# Patient Record
Sex: Female | Born: 1975 | Race: White
Health system: Southern US, Community
[De-identification: ages and names within clinical notes are randomized; demographics above are authoritative.]

## PROBLEM LIST (undated history)

## (undated) ENCOUNTER — Emergency Department (HOSPITAL_COMMUNITY): Payer: Self-pay | Source: Home / Self Care

## (undated) DIAGNOSIS — R87629 Unspecified abnormal cytological findings in specimens from vagina: Secondary | ICD-10-CM

## (undated) DIAGNOSIS — Z22330 Carrier of Group B streptococcus: Secondary | ICD-10-CM

## (undated) DIAGNOSIS — F32A Depression, unspecified: Secondary | ICD-10-CM

## (undated) DIAGNOSIS — J302 Other seasonal allergic rhinitis: Secondary | ICD-10-CM

## (undated) DIAGNOSIS — N151 Renal and perinephric abscess: Secondary | ICD-10-CM

## (undated) DIAGNOSIS — IMO0002 Reserved for concepts with insufficient information to code with codable children: Secondary | ICD-10-CM

## (undated) DIAGNOSIS — B999 Unspecified infectious disease: Secondary | ICD-10-CM

## (undated) DIAGNOSIS — F329 Major depressive disorder, single episode, unspecified: Secondary | ICD-10-CM

## (undated) DIAGNOSIS — B373 Candidiasis of vulva and vagina: Secondary | ICD-10-CM

## (undated) DIAGNOSIS — F909 Attention-deficit hyperactivity disorder, unspecified type: Secondary | ICD-10-CM

## (undated) DIAGNOSIS — F419 Anxiety disorder, unspecified: Secondary | ICD-10-CM

## (undated) DIAGNOSIS — B3731 Acute candidiasis of vulva and vagina: Secondary | ICD-10-CM

## (undated) HISTORY — DX: Attention-deficit hyperactivity disorder, unspecified type: F90.9

## (undated) HISTORY — PX: WISDOM TOOTH EXTRACTION: SHX21

## (undated) HISTORY — DX: Anxiety disorder, unspecified: F41.9

## (undated) HISTORY — DX: Acute candidiasis of vulva and vagina: B37.31

## (undated) HISTORY — DX: Candidiasis of vulva and vagina: B37.3

## (undated) HISTORY — PX: OTHER SURGICAL HISTORY: SHX169

## (undated) HISTORY — PX: KNEE SURGERY: SHX244

## (undated) HISTORY — PX: ANTERIOR CRUCIATE LIGAMENT REPAIR: SHX115

## (undated) HISTORY — PX: TUBAL LIGATION: SHX77

## (undated) HISTORY — PX: COLPOSCOPY: SHX161

---

## 1997-12-22 ENCOUNTER — Inpatient Hospital Stay (HOSPITAL_COMMUNITY): Admission: AD | Admit: 1997-12-22 | Discharge: 1997-12-22 | Payer: Self-pay | Admitting: *Deleted

## 1998-09-12 ENCOUNTER — Inpatient Hospital Stay (HOSPITAL_COMMUNITY): Admission: AD | Admit: 1998-09-12 | Discharge: 1998-09-12 | Payer: Self-pay | Admitting: Obstetrics

## 1998-09-14 ENCOUNTER — Inpatient Hospital Stay (HOSPITAL_COMMUNITY): Admission: AD | Admit: 1998-09-14 | Discharge: 1998-09-14 | Payer: Self-pay | Admitting: Obstetrics

## 1998-10-28 ENCOUNTER — Emergency Department (HOSPITAL_COMMUNITY): Admission: EM | Admit: 1998-10-28 | Discharge: 1998-10-28 | Payer: Self-pay | Admitting: Emergency Medicine

## 1999-05-02 ENCOUNTER — Inpatient Hospital Stay (HOSPITAL_COMMUNITY): Admission: AD | Admit: 1999-05-02 | Discharge: 1999-05-02 | Payer: Self-pay | Admitting: Obstetrics & Gynecology

## 2000-01-27 ENCOUNTER — Emergency Department (HOSPITAL_COMMUNITY): Admission: EM | Admit: 2000-01-27 | Discharge: 2000-01-27 | Payer: Self-pay | Admitting: Emergency Medicine

## 2001-04-02 ENCOUNTER — Emergency Department: Admit: 2001-04-02 | Payer: Self-pay | Source: Emergency Department | Admitting: Emergency Medicine

## 2001-05-03 ENCOUNTER — Emergency Department (HOSPITAL_COMMUNITY): Admission: EM | Admit: 2001-05-03 | Discharge: 2001-05-03 | Payer: Self-pay

## 2001-05-03 ENCOUNTER — Encounter: Payer: Self-pay | Admitting: Emergency Medicine

## 2001-06-21 ENCOUNTER — Emergency Department (HOSPITAL_COMMUNITY): Admission: EM | Admit: 2001-06-21 | Discharge: 2001-06-21 | Payer: Self-pay | Admitting: *Deleted

## 2001-08-10 ENCOUNTER — Emergency Department (HOSPITAL_COMMUNITY): Admission: EM | Admit: 2001-08-10 | Discharge: 2001-08-10 | Payer: Self-pay

## 2001-08-23 ENCOUNTER — Emergency Department: Admit: 2001-08-23 | Payer: Self-pay | Source: Emergency Department | Admitting: Internal Medicine

## 2001-12-15 ENCOUNTER — Inpatient Hospital Stay (HOSPITAL_COMMUNITY): Admission: AD | Admit: 2001-12-15 | Discharge: 2001-12-15 | Payer: Self-pay | Admitting: Obstetrics

## 2002-02-02 ENCOUNTER — Emergency Department (HOSPITAL_COMMUNITY): Admission: EM | Admit: 2002-02-02 | Discharge: 2002-02-02 | Payer: Self-pay | Admitting: Emergency Medicine

## 2002-02-02 ENCOUNTER — Encounter: Payer: Self-pay | Admitting: Emergency Medicine

## 2002-05-08 ENCOUNTER — Encounter: Payer: Self-pay | Admitting: Emergency Medicine

## 2002-05-08 ENCOUNTER — Emergency Department (HOSPITAL_COMMUNITY): Admission: EM | Admit: 2002-05-08 | Discharge: 2002-05-08 | Payer: Self-pay | Admitting: Emergency Medicine

## 2002-05-10 ENCOUNTER — Emergency Department (HOSPITAL_COMMUNITY): Admission: EM | Admit: 2002-05-10 | Discharge: 2002-05-10 | Payer: Self-pay | Admitting: Emergency Medicine

## 2002-05-10 ENCOUNTER — Encounter: Payer: Self-pay | Admitting: Emergency Medicine

## 2002-12-13 ENCOUNTER — Encounter (INDEPENDENT_AMBULATORY_CARE_PROVIDER_SITE_OTHER): Payer: Self-pay | Admitting: Specialist

## 2002-12-13 ENCOUNTER — Ambulatory Visit (HOSPITAL_COMMUNITY): Admission: RE | Admit: 2002-12-13 | Discharge: 2002-12-13 | Payer: Self-pay | Admitting: Obstetrics

## 2004-04-30 ENCOUNTER — Emergency Department (HOSPITAL_COMMUNITY): Admission: EM | Admit: 2004-04-30 | Discharge: 2004-04-30 | Payer: Self-pay | Admitting: Family Medicine

## 2004-07-22 ENCOUNTER — Emergency Department (HOSPITAL_COMMUNITY): Admission: EM | Admit: 2004-07-22 | Discharge: 2004-07-22 | Payer: Self-pay | Admitting: Emergency Medicine

## 2004-08-19 ENCOUNTER — Inpatient Hospital Stay (HOSPITAL_COMMUNITY): Admission: AD | Admit: 2004-08-19 | Discharge: 2004-08-19 | Payer: Self-pay | Admitting: Obstetrics & Gynecology

## 2005-01-07 ENCOUNTER — Ambulatory Visit
Admit: 2005-01-07 | Disposition: A | Discharge status: Home / Self Care | Payer: Self-pay | Source: Ambulatory Visit | Admitting: Obstetrics and Gynecology

## 2005-03-03 ENCOUNTER — Observation Stay
Admission: RE | Admit: 2005-03-03 | Disposition: A | Discharge status: Home / Self Care | Payer: Self-pay | Source: Ambulatory Visit | Admitting: Obstetrics & Gynecology

## 2005-03-23 ENCOUNTER — Emergency Department (HOSPITAL_COMMUNITY): Admission: EM | Admit: 2005-03-23 | Discharge: 2005-03-23 | Payer: Self-pay | Admitting: Emergency Medicine

## 2005-05-13 ENCOUNTER — Observation Stay
Admission: RE | Admit: 2005-05-13 | Disposition: A | Discharge status: Home / Self Care | Payer: Self-pay | Source: Ambulatory Visit | Admitting: Obstetrics and Gynecology

## 2005-05-15 ENCOUNTER — Inpatient Hospital Stay
Admission: RE | Admit: 2005-05-15 | Disposition: A | Discharge status: Home / Self Care | Payer: Self-pay | Source: Ambulatory Visit | Admitting: Obstetrics and Gynecology

## 2005-06-09 ENCOUNTER — Ambulatory Visit (HOSPITAL_BASED_OUTPATIENT_CLINIC_OR_DEPARTMENT_OTHER): Admission: RE | Admit: 2005-06-09 | Discharge: 2005-06-10 | Payer: Self-pay | Admitting: Orthopedic Surgery

## 2005-06-18 ENCOUNTER — Encounter: Admission: RE | Admit: 2005-06-18 | Discharge: 2005-08-08 | Payer: Self-pay | Admitting: Orthopedic Surgery

## 2005-08-21 ENCOUNTER — Emergency Department (HOSPITAL_COMMUNITY): Admission: EM | Admit: 2005-08-21 | Discharge: 2005-08-21 | Payer: Self-pay | Admitting: Emergency Medicine

## 2006-11-27 ENCOUNTER — Ambulatory Visit: Admission: RE | Admit: 2006-11-27 | Payer: Self-pay | Source: Ambulatory Visit | Admitting: Obstetrics and Gynecology

## 2006-12-18 LAB — CYTOGENETICS-SOLID TISSUE (POC)

## 2007-01-29 ENCOUNTER — Emergency Department (HOSPITAL_COMMUNITY): Admission: EM | Admit: 2007-01-29 | Discharge: 2007-01-30 | Payer: Self-pay | Admitting: Emergency Medicine

## 2007-03-21 ENCOUNTER — Emergency Department (HOSPITAL_COMMUNITY): Admission: EM | Admit: 2007-03-21 | Discharge: 2007-03-21 | Payer: Self-pay | Admitting: Emergency Medicine

## 2007-07-29 ENCOUNTER — Inpatient Hospital Stay: Admit: 2007-07-29 | Disposition: A | Discharge status: Home / Self Care | Payer: Self-pay | Source: Ambulatory Visit

## 2008-01-26 ENCOUNTER — Emergency Department (HOSPITAL_COMMUNITY): Admission: EM | Admit: 2008-01-26 | Discharge: 2008-01-26 | Payer: Self-pay | Admitting: Emergency Medicine

## 2008-02-02 ENCOUNTER — Inpatient Hospital Stay
Admission: RE | Admit: 2008-02-02 | Disposition: A | Discharge status: Home / Self Care | Payer: Self-pay | Source: Ambulatory Visit | Admitting: Obstetrics and Gynecology

## 2008-02-02 LAB — CBC
Hematocrit: 36 % — ABNORMAL LOW (ref 37.0–47.0)
Hgb: 12.5 G/DL (ref 12.0–16.0)
MCH: 29.8 PG (ref 28.0–32.0)
MCHC: 34.7 G/DL (ref 32.0–36.0)
MCV: 85.7 FL (ref 80.0–100.0)
MPV: 9.8 FL (ref 9.4–12.3)
Platelets: 312 /mm3 (ref 140–400)
RBC: 4.2 /mm3 (ref 4.20–5.40)
RDW: 12.9 % (ref 11.5–15.0)
WBC: 10.22 /mm3 (ref 3.50–10.80)

## 2008-02-02 LAB — TYPE AND SCREEN: AB Screen Gel: NEGATIVE

## 2008-02-03 LAB — HEMOGLOBIN AND HEMATOCRIT, BLOOD
Hematocrit: 28.1 % — ABNORMAL LOW (ref 37.0–47.0)
Hgb: 9.6 G/DL — ABNORMAL LOW (ref 12.0–16.0)

## 2008-02-26 ENCOUNTER — Emergency Department (HOSPITAL_COMMUNITY): Admission: EM | Admit: 2008-02-26 | Discharge: 2008-02-26 | Payer: Self-pay | Admitting: Emergency Medicine

## 2008-03-05 ENCOUNTER — Emergency Department (HOSPITAL_COMMUNITY): Admission: EM | Admit: 2008-03-05 | Discharge: 2008-03-05 | Payer: Self-pay | Admitting: Emergency Medicine

## 2008-03-28 ENCOUNTER — Emergency Department (HOSPITAL_COMMUNITY): Admission: EM | Admit: 2008-03-28 | Discharge: 2008-03-29 | Payer: Self-pay | Admitting: Emergency Medicine

## 2008-03-31 ENCOUNTER — Inpatient Hospital Stay (HOSPITAL_COMMUNITY): Admission: EM | Admit: 2008-03-31 | Discharge: 2008-04-05 | Payer: Self-pay | Admitting: Emergency Medicine

## 2008-04-17 ENCOUNTER — Emergency Department (HOSPITAL_COMMUNITY): Admission: EM | Admit: 2008-04-17 | Discharge: 2008-04-17 | Payer: Self-pay | Admitting: Pulmonary Disease

## 2008-07-15 ENCOUNTER — Emergency Department (HOSPITAL_COMMUNITY): Admission: EM | Admit: 2008-07-15 | Discharge: 2008-07-15 | Payer: Self-pay | Admitting: Emergency Medicine

## 2008-11-25 ENCOUNTER — Emergency Department (HOSPITAL_COMMUNITY): Admission: EM | Admit: 2008-11-25 | Discharge: 2008-11-25 | Payer: Self-pay | Admitting: Emergency Medicine

## 2009-02-18 ENCOUNTER — Emergency Department: Admit: 2009-02-18 | Payer: Self-pay | Source: Emergency Department | Admitting: Emergency Medical Services

## 2009-02-18 LAB — BASIC METABOLIC PANEL
BUN: 15 MG/DL (ref 7–21)
CO2: 28 MEQ/L (ref 22–31)
Calcium: 9.4 MG/DL (ref 8.6–10.2)
Chloride: 103 MEQ/L (ref 98–107)
Creatinine: 0.8 MG/DL (ref 0.5–1.4)
Glucose: 74 MG/DL (ref 65–110)
Potassium: 4.2 MEQ/L (ref 3.6–5.0)
Sodium: 139 MEQ/L (ref 136–143)

## 2009-02-18 LAB — CBC AND DIFFERENTIAL
Basophils Absolute: 0 /mm3 (ref 0.0–0.2)
Basophils: 0 % (ref 0–2)
Eosinophils Absolute: 0.1 /mm3 (ref 0.0–0.7)
Eosinophils: 2 % (ref 0–5)
Granulocytes Absolute: 4.5 /mm3 (ref 1.8–8.1)
Hematocrit: 35.3 % — ABNORMAL LOW (ref 37.0–47.0)
Hgb: 12.1 G/DL (ref 12.0–16.0)
Immature Granulocytes Absolute: 0 CUMM (ref 0.0–0.0)
Immature Granulocytes: 0 % (ref 0–1)
Lymphocytes Absolute: 2.7 /mm3 (ref 0.5–4.4)
Lymphocytes: 34 % (ref 15–41)
MCH: 28.5 PG (ref 28.0–32.0)
MCHC: 34.3 G/DL (ref 32.0–36.0)
MCV: 83.1 FL (ref 80.0–100.0)
MPV: 9.3 FL — ABNORMAL LOW (ref 9.4–12.3)
Monocytes Absolute: 0.6 /mm3 (ref 0.0–1.2)
Monocytes: 8 % (ref 0–11)
Neutrophils %: 56 % (ref 52–75)
Platelets: 373 /mm3 (ref 140–400)
RBC: 4.25 /mm3 (ref 4.20–5.40)
RDW: 13 % (ref 11.5–15.0)
WBC: 8.02 /mm3 (ref 3.50–10.80)

## 2009-02-18 LAB — HCG, SERUM, QUALITATIVE: Hcg Qualitative: NEGATIVE

## 2009-02-18 LAB — GFR

## 2009-02-20 LAB — LYME DISEASE SEROLOGY, S-SOFT

## 2009-10-13 ENCOUNTER — Inpatient Hospital Stay (HOSPITAL_COMMUNITY): Admission: AD | Admit: 2009-10-13 | Discharge: 2009-10-13 | Payer: Self-pay | Admitting: Obstetrics and Gynecology

## 2009-10-23 IMAGING — CT CT MAXILLOFACIAL W/ CM
3 series · 16 of 47 positions shown, 19 images · IV contrast (omnipaque)
Comparison: None available.

CLINICAL DATA: Facial swelling and bruising.  Left maxillary
swelling and pain.

CT MAXILLOFACIAL WITH CONTRAST
TECHNIQUE: Multidetector CT imaging of the maxillofacial
structures was performed with intravenous contrast. Multiplanar CT
image reconstructions were also generated.
Contrast: 100 ml Omnipaque 300

[Series 3: orbit 2.0 h32s · axial · 0.30mm/px · z∈[+981,+1101]mm · 10 of 127 slices shown, 13 images]
[im 9/127  brain]
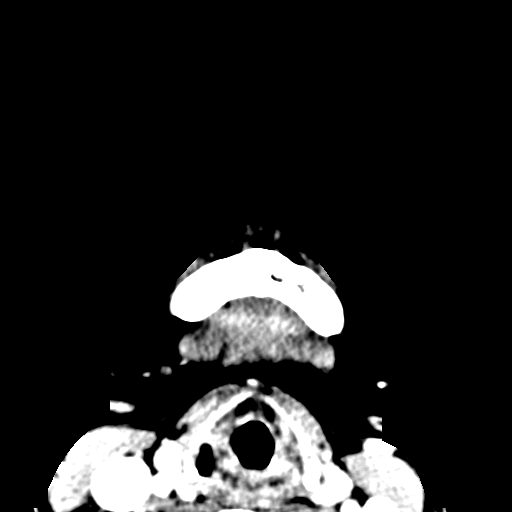
[im 9/127  bone]
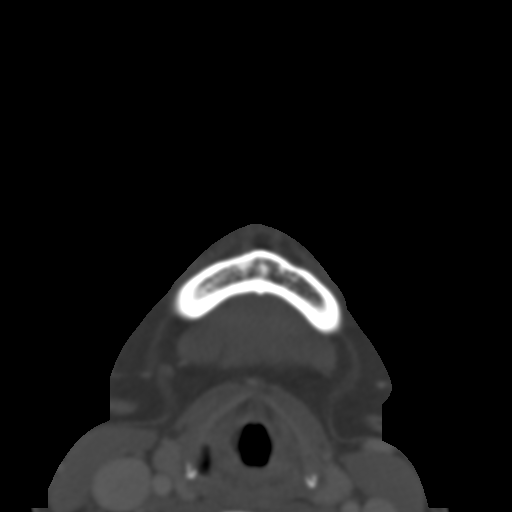
[im 22/127  bone]
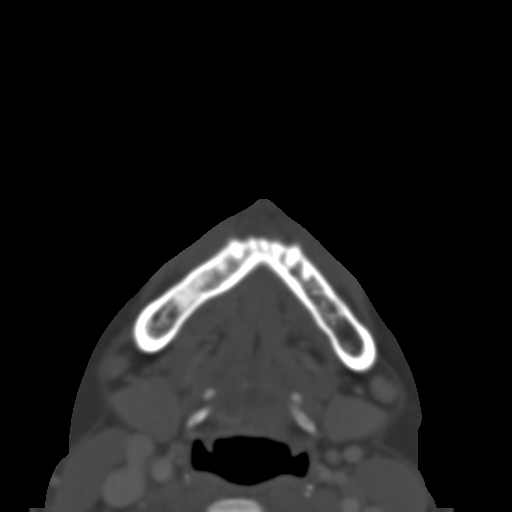
[im 35/127  bone]
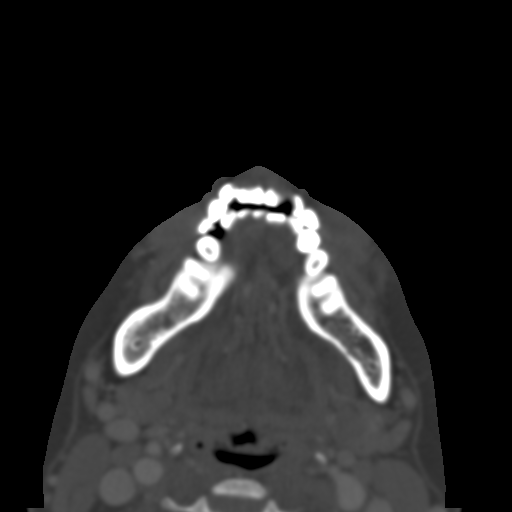
[im 44/127  bone]
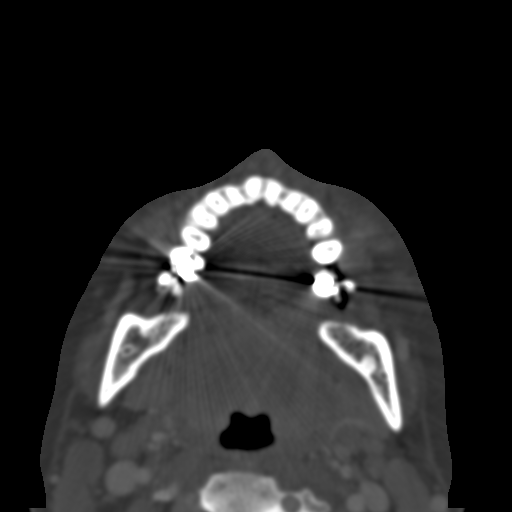
[im 57/127  brain]
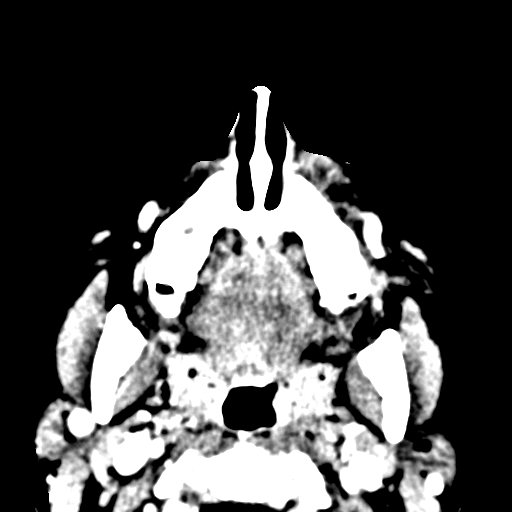
[im 57/127  bone]
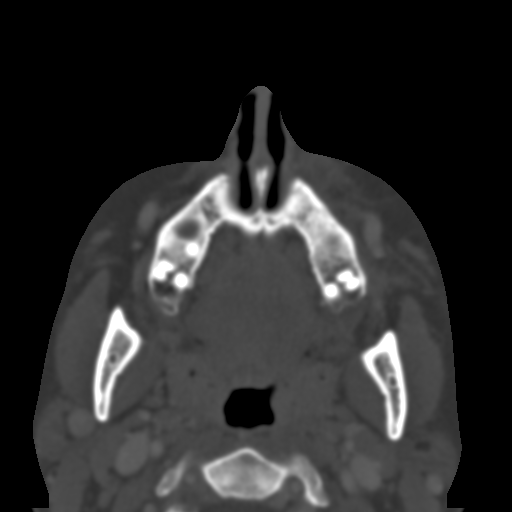
[im 70/127  bone]
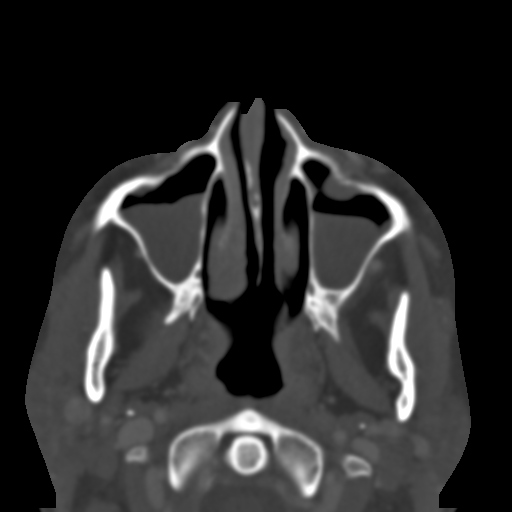
[im 83/127  bone]
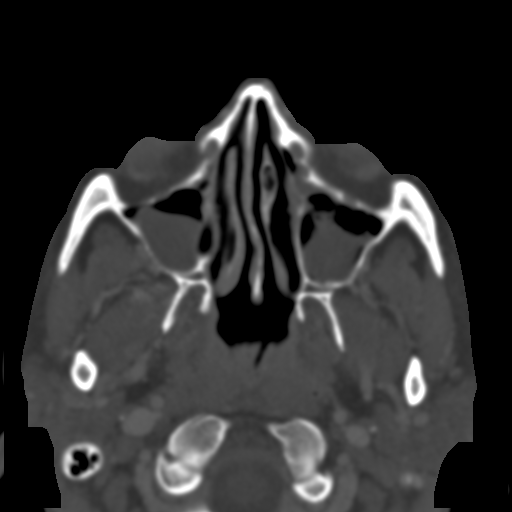
[im 96/127  bone]
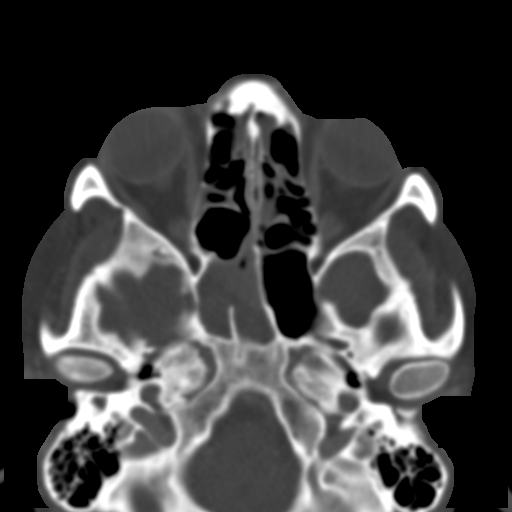
[im 105/127  brain]
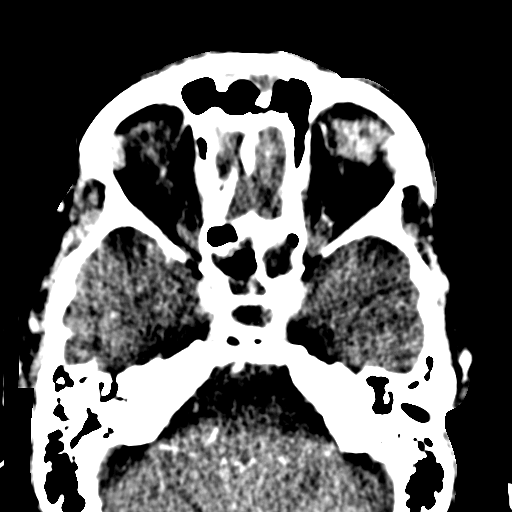
[im 105/127  bone]
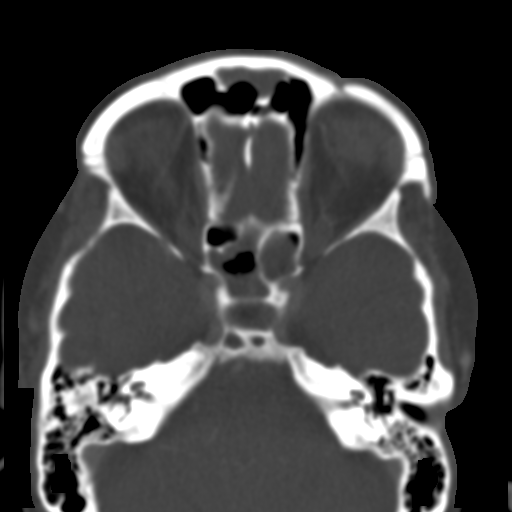
[im 118/127  bone]
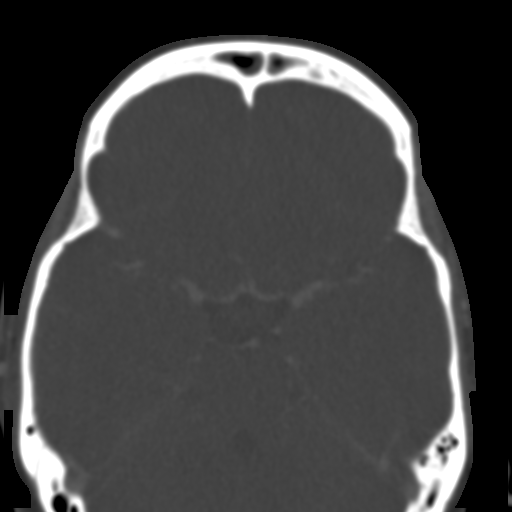

[Series 602: coronal · coronal · 0.30mm/px · 3 of 67 slices shown]
[im 23/67  bone]
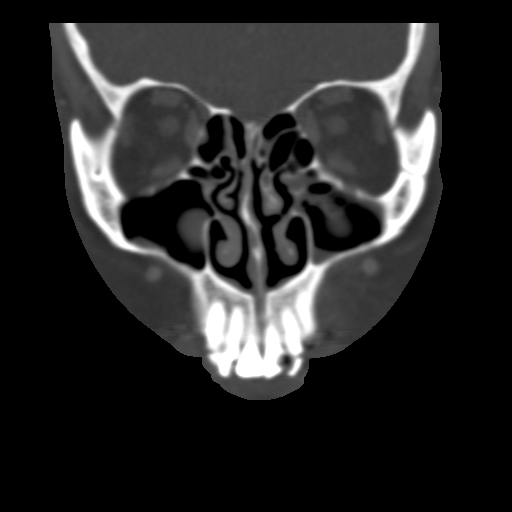
[im 30/67  bone]
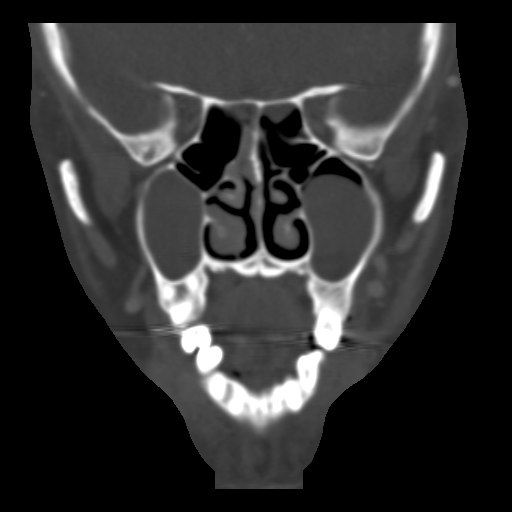
[im 37/67  bone]
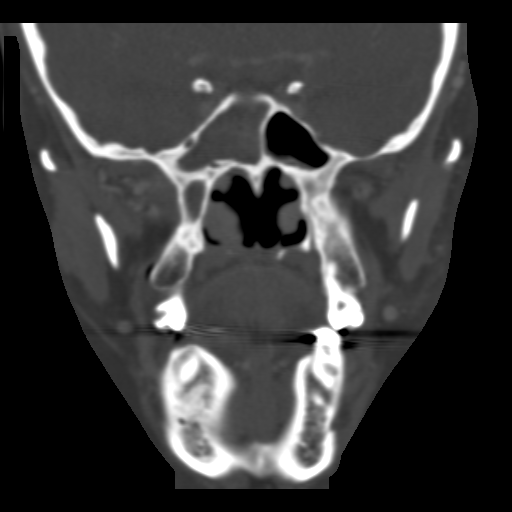

[Series 603: sagittal · sagittal · 0.30mm/px · 3 of 71 slices shown]
[im 24/71  bone]
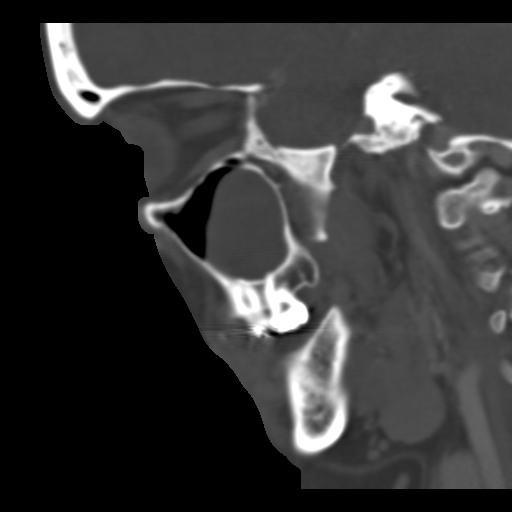
[im 36/71  bone]
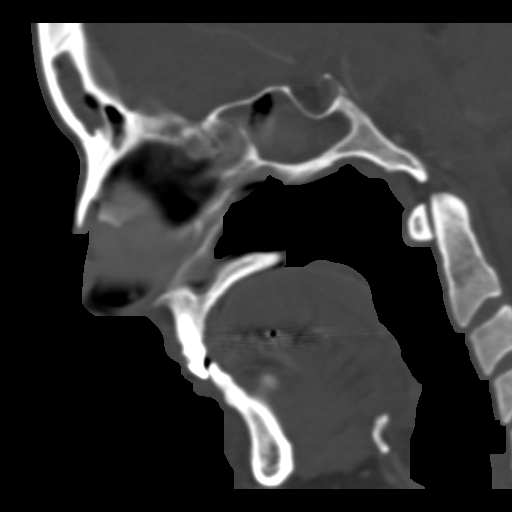
[im 47/71  bone]
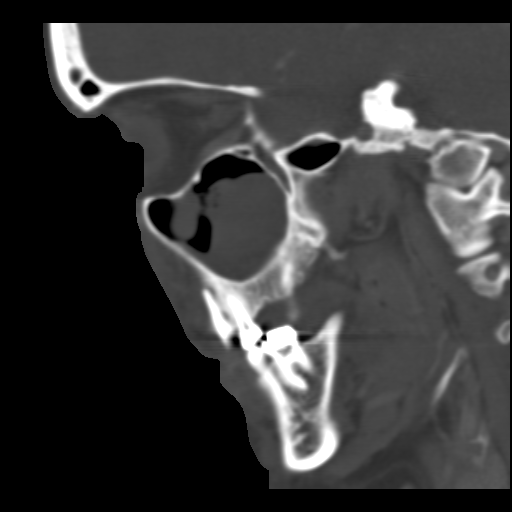

[16 of 47 positions shown; findings below may reference images not displayed]

FINDINGS: There is ill-defined soft tissue swelling over the left
face at the level of the maxilla.  A large dental caries is
associated with tooth number 12.  There is a shallow periapical
lucency with extension to the cortical surface.  There are
overlying inflammatory changes without a discrete abscess.  There
is soft tissue stranding into the left cheek.  The mandible is
intact.  Incidental note is made of accessory parotid tissue
bilaterally.  Limited imaging the brain demonstrates a fat
containing lesion within the quadrigeminal plate cistern compatible
with a dermoid or lipoma.  Large polyps or mucous retention cysts
are noted in the maxillary sinuses.  There are also frothy
secretions.  There is scattered opacification of the ethmoid air
cells bilaterally.  There is near total opacification of the right
sphenoid sinus.  Frontal sinuses are near totally occluded.
Mastoid air cells are clear.
IMPRESSION: 1.  Large dental caries involving tooth number 12 with a small
periapical abscess and overlying soft tissue swelling but no
discrete abscess.
2.  Soft tissue swelling in the subcutaneous tissues of the left
cheek likely secondary to the dental source.
3.  Extensive sinus disease.  Chronic disease is present in the
right sphenoid sinus.
4.  A fat containing lesion in the quadrigeminal plate cistern is
incompletely imaged but measures at least 9 x 18 mm.  This is most
compatible with a dermoid or less likely lipoma.

## 2010-04-20 ENCOUNTER — Emergency Department (HOSPITAL_COMMUNITY): Admission: EM | Admit: 2010-04-20 | Discharge: 2010-04-20 | Payer: Self-pay | Admitting: Emergency Medicine

## 2010-05-29 ENCOUNTER — Emergency Department (HOSPITAL_COMMUNITY)
Admission: EM | Admit: 2010-05-29 | Discharge: 2010-05-29 | Payer: Self-pay | Source: Home / Self Care | Admitting: Emergency Medicine

## 2010-05-31 ENCOUNTER — Emergency Department (HOSPITAL_BASED_OUTPATIENT_CLINIC_OR_DEPARTMENT_OTHER)
Admission: EM | Admit: 2010-05-31 | Discharge: 2010-05-31 | Payer: Self-pay | Source: Home / Self Care | Admitting: Emergency Medicine

## 2010-06-23 ENCOUNTER — Encounter: Payer: Self-pay | Admitting: Urology

## 2010-08-20 LAB — URINALYSIS, ROUTINE W REFLEX MICROSCOPIC
Bilirubin Urine: NEGATIVE
Glucose, UA: NEGATIVE mg/dL
Hgb urine dipstick: NEGATIVE
Nitrite: NEGATIVE
Specific Gravity, Urine: <1.005 — ABNORMAL LOW (ref 1.005–1.030)
Urobilinogen, UA: 0.2 mg/dL (ref 0.0–1.0)

## 2010-08-20 LAB — URINE MICROSCOPIC-ADD ON

## 2010-08-20 LAB — WET PREP, GENITAL

## 2010-08-20 LAB — GC/CHLAMYDIA PROBE AMP, GENITAL: Chlamydia, DNA Probe: NEGATIVE

## 2010-09-09 LAB — CBC
Hemoglobin: 14.9 g/dL (ref 12.0–15.0)
MCHC: 34.3 g/dL (ref 30.0–36.0)
RBC: 4.57 MIL/uL (ref 3.87–5.11)
WBC: 9.5 10*3/uL (ref 4.0–10.5)

## 2010-09-09 LAB — DIFFERENTIAL
Eosinophils Relative: 2 % (ref 0–5)
Lymphocytes Relative: 29 % (ref 12–46)
Lymphs Abs: 2.7 10*3/uL (ref 0.7–4.0)
Neutro Abs: 5.9 10*3/uL (ref 1.7–7.7)
Neutrophils Relative %: 62 % (ref 43–77)

## 2010-09-17 LAB — CBC
Hemoglobin: 14.6 g/dL (ref 12.0–15.0)
MCHC: 34.5 g/dL (ref 30.0–36.0)
MCV: 92.5 fL (ref 78.0–100.0)
WBC: 9.2 10*3/uL (ref 4.0–10.5)

## 2010-09-17 LAB — POCT I-STAT, CHEM 8
Chloride: 105 mEq/L (ref 96–112)
Creatinine, Ser: 1.2 mg/dL (ref 0.4–1.2)
Glucose, Bld: 91 mg/dL (ref 70–99)
Hemoglobin: 16 g/dL — ABNORMAL HIGH (ref 12.0–15.0)

## 2010-09-17 LAB — DIFFERENTIAL
Basophils Relative: 0 % (ref 0–1)
Eosinophils Relative: 1 % (ref 0–5)
Lymphocytes Relative: 29 % (ref 12–46)
Lymphs Abs: 2.7 10*3/uL (ref 0.7–4.0)
Neutrophils Relative %: 64 % (ref 43–77)

## 2010-09-27 ENCOUNTER — Emergency Department (HOSPITAL_COMMUNITY)
Admission: EM | Admit: 2010-09-27 | Discharge: 2010-09-27 | Disposition: A | Discharge status: Home / Self Care | Payer: Self-pay | Attending: Emergency Medicine | Admitting: Emergency Medicine

## 2010-09-27 DIAGNOSIS — J329 Chronic sinusitis, unspecified: Secondary | ICD-10-CM | POA: Insufficient documentation

## 2010-09-27 DIAGNOSIS — R300 Dysuria: Secondary | ICD-10-CM | POA: Insufficient documentation

## 2010-09-27 LAB — URINALYSIS, ROUTINE W REFLEX MICROSCOPIC
Glucose, UA: NEGATIVE mg/dL
Hgb urine dipstick: NEGATIVE
Nitrite: NEGATIVE
Urobilinogen, UA: 0.2 mg/dL (ref 0.0–1.0)
pH: 7 (ref 5.0–8.0)

## 2010-09-27 LAB — POCT PREGNANCY, URINE: Preg Test, Ur: NEGATIVE

## 2010-09-29 LAB — URINE CULTURE: Culture  Setup Time: 201204280155

## 2010-10-05 ENCOUNTER — Emergency Department (HOSPITAL_COMMUNITY)
Admission: EM | Admit: 2010-10-05 | Discharge: 2010-10-05 | Disposition: A | Discharge status: Home / Self Care | Payer: Self-pay | Attending: Emergency Medicine | Admitting: Emergency Medicine

## 2010-10-05 DIAGNOSIS — J309 Allergic rhinitis, unspecified: Secondary | ICD-10-CM | POA: Insufficient documentation

## 2010-10-05 DIAGNOSIS — H9209 Otalgia, unspecified ear: Secondary | ICD-10-CM | POA: Insufficient documentation

## 2010-10-05 DIAGNOSIS — H6590 Unspecified nonsuppurative otitis media, unspecified ear: Secondary | ICD-10-CM | POA: Insufficient documentation

## 2010-10-15 NOTE — Discharge Summary (Signed)
NAMEALLIEN, MELBERG            ACCOUNT NO.:  [id number]   MEDICAL RECORD NO.:  [id number]          PATIENT TYPE:  INP   LOCATION:  1522                         FACILITY:  [facility]   PHYSICIAN:  Courtney Paris, M.D.DATE OF BIRTH:  January 24, 1976   DATE OF ADMISSION:  03/31/2008  DATE OF DISCHARGE:  04/05/2008                               DISCHARGE SUMMARY   DISCHARGE DIAGNOSES:  1. Left renal abscess.  2. Pyelonephritis.   OPERATIONS AND PROCEDURES:  Left percutaneous drainage of left renal  abscess on March 31, 2008, and cystoscopy and placement retrograde and  double-J stent on left, April 04, 2008.   BRIEF HISTORY:  This 35 year old white female was admitted to the  emergency room where she came with recurrent left-sided flank pain.  She  went to the emergency room 3 to 4 weeks ago in early October and was  told she had a UTI.  She was treated with antibiotics for 10 days.  She returned on March 28, 2008, with worsening pain and was put on  Cipro.  She came back again on the day of admission, March 31, 2008,  with worsening pain.  CT now showed a fluid collection in the superior  portion of the left kidney consistent with a renal abscess.  She also  had some mild right hydronephrosis without evidence of obstructive  uropathy.  This is the 3rd UTI she has had this year, more than usual.  She was admitted, had a percutaneous drain placed for the left renal  abscess, and this drained the abscess which actually grew out no  organisms and it started draining copious amounts of urine.  On April 04, 2008, she underwent a cysto and retrograde and this demonstrated a  connection of the upper pole calix with the abscess cavity so a double-J  stent was placed.  This will hopefully help the fistula to dry up and  allow [place] to remove the drainage.  The abscess cavity had been completely  drained by this time.  She was sent home afebrile on a regular diet,  draining  approximately 320 mL from the left percutaneous tube daily with  instructions to change the dressing every day and to keep up with the  outputs from the left percutaneous drain.  She will come back in an week  and we will remove the drain if it is dry.  Sent home in improved  ambulatory condition on a regular diet.      Courtney Paris, M.D.  Electronically Signed     HMK/MEDQ  D:  04/05/2008  T:  04/05/2008  Job:  [id number]

## 2010-10-15 NOTE — H&P (Signed)
NAMEJLEE, HARKLESS            ACCOUNT NO.:  [id number]   MEDICAL RECORD NO.:  [id number]          PATIENT TYPE:  INP   LOCATION:  0107                         FACILITY:  [facility]   PHYSICIAN:  Courtney Paris, M.D.DATE OF BIRTH:  December 03, 1975   DATE OF ADMISSION:  03/31/2008  DATE OF DISCHARGE:                              HISTORY & PHYSICAL   HISTORY OF PRESENT ILLNESS:  This 35 year old white female is admitted  through the emergency room where she came back with recurrent left-sided  flank pain.  She went to the emergency room about 3-4 weeks ago in early  October and was told she had an UTI.  She was treated antibiotics for  10 days.  She returned on March 28, 2008 with worsening pain and was  put on Cipro, and now comes back with worse pain.  CT now shows a fluid  collection on the superior portion of the left kidney with fluid  consistent with renal abscess.  She has right-sided hydronephrosis  without evidence of obstructive uropathy and is thought that the  pelvis  was negative.  She said this is the third UTI she has had this year.  These are not what she normally has.  She has fever, chills, dysuria and  frequency when she has her symptoms.  She is admitted for further  treatment and care.   PAST SURGICAL HISTORY:  1. Tubal ligation in 1998.  2. She had ACL of her right knee in 2007.  3. She had a cervical biopsy in 2004.   FAMILY HISTORY/SOCIAL HISTORY:  She is divorced, has 3 kids 48, 12 and  21 in school.  She works at [employer] and lives with her kids at home.  There is no family history of pertinent findings.   ALLERGIES:  PERCOCET.   MEDICATIONS:  She is on just Cipro, plus something for nausea and pain.   REVIEW OF SYSTEMS:  Negative x 12 systems, except for cough.  No cardiac  or pulmonary symptomatology.  No GI complaints.  No arthritis or joint  pain.   PHYSICAL EXAMINATION:  VITAL SIGNS:  Temperature is 99, pulse of 100,  respirations 18.  GENERAL:   She is a pleasant, blond, young white female in no acute  distress.  HEENT:  Clear.  NECK:  Supple.  LUNGS:  Clear.  ABDOMEN:  Obese and soft, does have left CVA tenderness.  No masses.  PELVIC:  Deferred at this time.  EXTREMITIES:  Negative.  No edema.  Good distal pulses.   LABORATORY DATA:  White count 4700, hematocrit 34.3 and creatinine 1.1.   IMPRESSION:  1. Left renal abscess secondary to pyelonephritis.  2. Right-sided hydronephrosis without evidence of obstruction      anatomically.   RECOMMENDATIONS:  Admit, IV hydration, IV antibiotics and percutaneous  left renal drain.      Courtney Paris, M.D.  Electronically Signed     HMK/MEDQ  D:  03/31/2008  T:  03/31/2008  Job:  [id number]

## 2010-10-15 NOTE — Op Note (Signed)
[name], Crystal Hernandez            ACCOUNT NO.:  [id number]   MEDICAL RECORD NO.:  [id number]          PATIENT TYPE:  INP   LOCATION:  1522                         FACILITY:  [facility]   PHYSICIAN:  Courtney Paris, M.D.DATE OF BIRTH:  June 10, 1975   DATE OF PROCEDURE:  04/04/2008  DATE OF DISCHARGE:                               OPERATIVE REPORT   PREOPERATIVE DIAGNOSES:  1. Left upper pole fistula.  2. Left renal abscess.   POSTOPERATIVE DIAGNOSES:  1. Left upper pole fistula.  2. Left renal abscess.   OPERATION:  1. Cystoscopy.  2. Left retrograde pyelogram.  3. Insertion of left double-J stent.   ANESTHESIA:  General.   SURGEON:  Courtney Paris, M.D.   BRIEF HISTORY:  This 35 year old white female admitted to 5 days ago  with a left renal abscess was drained percutaneously, but after the  abscess drained, she started passing urine out of the drainage.  CT scan  yesterday showed that the abscess was completely drained, but there was  a connection with the upper pole of the left kidney to the drained  cavity.  She enters now for a cysto and double-J stent so that this will  dry up and she can go home.   The patient was placed on the operating table in the dorsal lithotomy  position after satisfactory induction of general endotracheal  anesthesia.  A time-out was then performed and the patient and the  procedure were then reconfirmed.  She had gotten her IV Cipro just 2  hours earlier.  She was prepped and draped with Betadine.  The bladder  was entered with the 21 panendoscope and carefully inspected.  It was  non-trabeculated and had no vital mucosal lesions except some mild  squamous metaplasia of the trigone.  Both orifices looked normal.  The  left orifice was catheterized with a 6 open-ended ureteral catheter and  an occlusive retrograde was then performed.   With the C-arm, the ureter was normal up to the renal pelvis.  An  injection of the dye showed  extravasation of the upper pole calix into  the cavity site where the drain was.  Pictures were made documenting  this.   Under fluoroscopy, a 0.38 floppy tip guidewire was then passed up the  open-ended catheter to the level of the kidney, the open-ended catheter  removed and over the guidewire a 6-French x 24 cm length double-J  ureteral stent  was passed.  As the guidewire was removed, there was a nice coil in the  renal pelvis and one in the bladder.  The stent was adjusted slightly  with grasping forceps.  The bladder drained, the B and O suppository  inserted, and the patient taken to the recovery room in good condition  and will be later discharged if the drainage clears up.      Courtney Paris, M.D.  Electronically Signed     HMK/MEDQ  D:  04/04/2008  T:  04/04/2008  Job:  [id number]

## 2010-10-18 NOTE — Op Note (Signed)
Crystal Hernandez, Crystal Hernandez            ACCOUNT NO.:  [id number]   MEDICAL RECORD NO.:  [id number]          PATIENT TYPE:  AMB   LOCATION:  DSC                          FACILITY:  [facility]   PHYSICIAN:  Robert A. Thurston Hole, M.D. DATE OF BIRTH:  1975-08-15   DATE OF PROCEDURE:  06/09/2005  DATE OF DISCHARGE:  03/23/2005                                 OPERATIVE REPORT   PREOPERATIVE DIAGNOSIS:  Right knee anterior cruciate ligament tear with  partial lateral meniscus tear.   POSTOPERATIVE DIAGNOSIS:  Right knee anterior cruciate ligament tear with  partial lateral meniscus tear.   OPERATION PERFORMED:  1.  Right knee examination under anesthesia followed by arthroscopically      assisted bone-patellar tendon-bone allograft, anterior cruciate ligament      reconstruction using 9 x 25 mm femoral interference BioScrew and 9 x 25      mm tibial interference BioScrew.  2.  Right knee partial lateral meniscectomy.   SURGEON:  Elana Alm. Thurston Hole, M.D.   ASSISTANT:  Julien Girt, P.A.   ANESTHESIA:  General.   OPERATIVE TIME:  One hour and 15 minutes.   COMPLICATIONS:  None.   INDICATIONS FOR PROCEDURE:  Crystal Hernandez is a 35 year old woman who sustained  a twisting pivot-shift injury to her right knee approximately two months  ago.  She has had persistent significant pain with instability with exam and  MRI documenting a complete ACL tear with partial lateral meniscus tear.  She  has failed conservative care and is now to undergo arthroscopy with anterior  cruciate ligament reconstruction.   DESCRIPTION OF PROCEDURE:  Crystal Hernandez was brought to the operating room on  June 09, 2005 after a femoral nerve block had been placed in the holding  room by anesthesia.  She was placed on the operating table in supine  position.  After being placed under general anesthesia, her right knee was  examined.  She had full range of motion, 3+ Lachman, positive pivot-shift,  knee stable to varus, valgus  and posterior stress with normal patellar  tracking.  The right leg was prepped using sterile DuraPrep and draped using  sterile technique.  She received Ancef 1 g IV preoperatively for  prophylaxis.  Initially, the arthroscopy was performed through an  anterolateral portal.  The arthroscope with a pump attached was placed and  through an anteromedial portal, an arthroscopic probe was placed.  On  initial inspection of the medial compartment, the articular cartilage showed  a grade 3 chondral defect, medial femoral condyle which was debrided.  Rest  of the medial compartment, articular cartilage was intact.  Medial meniscus  was intact.  Intercondylar notch was inspected.  Anterior cruciate ligament  was completely torn at its midsubstance with significant anterior laxity.  This was thoroughly debrided and the notchplasty was performed.  Posterior  cruciate ligament was intact and stable.  The lateral compartment was  inspected.  The articular cartilage was normal.  The lateral meniscus showed  a partial tear 25% posterior horn which was debrided.  The rest of the  lateral meniscus was intact.  Patellofemoral joint articular cartilage  was  normal.  The patella tracked normally.  Medial and lateral gutters were free  of pathology.  After this was done, then through a 1.5 cm anteromedial  proximal tibial incision using a tibial drill guide, a Steinmann pin was  drilled up in the ACL insertion point on the tibial plateau and then  overdrilled with a 10 mm drill.  Through this hole, a posterior femoral  guide was placed into the posterior femoral notch and the Steinmann pin  drilled up into the anterior cruciate ligament origin point and then  overdrilled with a 10 mm drill to a depth of 30 mm leaving a posterior 2 mm  bone bridge.  A double pin passer was then brought up through the tibial  tunnel and joining up through the femoral tunnel cortex and thigh through a  stab wound.  This was used  to pass the ACL allograft which had been prepared  on the back table with 10 x 25 mm of patellar bone and tibial tubercle bone  and the 12 mm graft.  The graft was passed up through the tibial tunnel and  joined it up into the femoral tunnel.  It was then locked in position there  with a 9 x 25 mm Interference BioScrew.  After this was done, the knee was  brought through a full range of motion.  There was found to be no  impingement of graft.  The tibial bone plug was then locked in this tunnel  with a 9 x 25 mm Interference BioScrew at the knee in 30 degrees of flexion  and the tibia held reduced on the femur.  After this was done, the Lachman  and pivot-shift were tested.  They were found to be eliminated.  The knee  was brought through a full range of motion with no impingement of the graft.  At this point it was felt that all pathology had been satisfactorily  addressed.  The wounds were irrigated and closed with 0 and 2-0 Vicryl and 4-  0 Monocryl.  Steri-Strips were applied.  Sterile dressings and a long leg  splint were applied.  The patient was awakened and taken to recovery room in  stable condition.   FOLLOW-UP CARE:  Crystal Hernandez will be followed overnight at the recovery care  center for IV pain control and neurovascular monitoring and CPM use.  Discharge tomorrow on Percocet with a home CPM.  See back in the office in a  week for suture removal and follow-up.      Robert A. Thurston Hole, M.D.  Electronically Signed     RAW/MEDQ  D:  06/09/2005  T:  06/09/2005  Job:  [id number]

## 2010-10-18 NOTE — Op Note (Signed)
   NAME:  Crystal Hernandez, Crystal Hernandez                      ACCOUNT NO.:  [id number]   MEDICAL RECORD NO.:  [id number]                   PATIENT TYPE:  OUT   LOCATION:  SDC                                  FACILITY:  [facility]   PHYSICIAN:  Kathreen Cosier, M.D.           DATE OF BIRTH:  [birth year]   DATE OF PROCEDURE:  12/13/2002  DATE OF DISCHARGE:                                 OPERATIVE REPORT   PREOPERATIVE DIAGNOSIS:  Severe dysplasia of the cervix.   PROCEDURE:  Cold knife conization of the cervix.   Under general anesthesia, the patient in lithotomy position, the perineum  and vagina were prepped and draped, bladder emptied with a straight  catheter.  Bimanual exam revealed the uterus to be normal size.  A weighted  speculum placed in the vagina.  The cervix was grasped at 3 o'clock and on  the lateral aspect of the cervix, a hemostatic suture of #1 chromic was  placed.  This was repeated at 9 o'clock.  Then a cold knife cone done in the  usual manner.  Hemostasis was achieved using U-sutures around the cervix of  #1 chromic.  The cervical canal was noted to be patent.  Hemostasis was  satisfactory.  The patient tolerated the procedure well, was taken to the  recovery room in good condition.                                               Kathreen Cosier, M.D.    BAM/MEDQ  D:  12/13/2002  T:  12/13/2002  Job:  [id number]

## 2011-01-20 DIAGNOSIS — E039 Hypothyroidism, unspecified: Secondary | ICD-10-CM | POA: Insufficient documentation

## 2011-01-21 ENCOUNTER — Ambulatory Visit (INDEPENDENT_AMBULATORY_CARE_PROVIDER_SITE_OTHER): Payer: Self-pay | Admitting: Internal Medicine

## 2011-01-23 DIAGNOSIS — M26639 Articular disc disorder of temporomandibular joint, unspecified side: Secondary | ICD-10-CM | POA: Insufficient documentation

## 2011-02-17 ENCOUNTER — Other Ambulatory Visit (INDEPENDENT_AMBULATORY_CARE_PROVIDER_SITE_OTHER): Payer: Self-pay | Admitting: Internal Medicine

## 2011-02-26 LAB — ECG 12-LEAD
Atrial Rate: 51 {beats}/min
P Axis: 46 degrees
P-R Interval: 174 ms
Q-T Interval: 432 ms
QRS Duration: 88 ms
QTC Calculation (Bezet): 398 ms
R Axis: 72 degrees
T Axis: 49 degrees
Ventricular Rate: 51 {beats}/min

## 2011-03-03 LAB — URINE CULTURE
Colony Count: 50000
Colony Count: NO GROWTH
Culture: NO GROWTH

## 2011-03-03 LAB — DIFFERENTIAL
Basophils Absolute: 0.1
Basophils Relative: 1
Eosinophils Relative: 0
Lymphocytes Relative: 4 — ABNORMAL LOW
Lymphocytes Relative: 6 — ABNORMAL LOW
Lymphs Abs: 0.3 — ABNORMAL LOW
Monocytes Absolute: 0.1
Monocytes Absolute: 0.2
Monocytes Relative: 2 — ABNORMAL LOW
Neutro Abs: 10.1 — ABNORMAL HIGH
Neutro Abs: 4.3
Neutrophils Relative %: 94 — ABNORMAL HIGH

## 2011-03-03 LAB — WET PREP, GENITAL
Clue Cells Wet Prep HPF POC: NONE SEEN
Trich, Wet Prep: NONE SEEN
Yeast Wet Prep HPF POC: NONE SEEN

## 2011-03-03 LAB — URINALYSIS, ROUTINE W REFLEX MICROSCOPIC
Bilirubin Urine: NEGATIVE
Glucose, UA: NEGATIVE
Glucose, UA: NEGATIVE
Ketones, ur: >80 — AB
Ketones, ur: NEGATIVE
Nitrite: NEGATIVE
Nitrite: POSITIVE — AB
Nitrite: POSITIVE — AB
Protein, ur: 100 — AB
Protein, ur: 100 — AB
Protein, ur: NEGATIVE
Specific Gravity, Urine: 1.024
Specific Gravity, Urine: 1.024
Urobilinogen, UA: 0.2
Urobilinogen, UA: 1
Urobilinogen, UA: 1
pH: 6
pH: 6

## 2011-03-03 LAB — POCT PREGNANCY, URINE: Preg Test, Ur: NEGATIVE

## 2011-03-03 LAB — ANAEROBIC CULTURE

## 2011-03-03 LAB — RPR: RPR Ser Ql: NONREACTIVE

## 2011-03-03 LAB — CULTURE, BLOOD (ROUTINE X 2): Culture: NO GROWTH

## 2011-03-03 LAB — CBC
HCT: 34.3 — ABNORMAL LOW
HCT: 35.6 — ABNORMAL LOW
Hemoglobin: 11.7 — ABNORMAL LOW
Hemoglobin: 11.8 — ABNORMAL LOW
MCHC: 34
MCV: 91.4
MCV: 92
Platelets: 127 — ABNORMAL LOW
Platelets: 161
RBC: 3.75 — ABNORMAL LOW
RDW: 11.9
RDW: 12.9
WBC: 4.7

## 2011-03-03 LAB — BASIC METABOLIC PANEL
BUN: 16
CO2: 18 — ABNORMAL LOW
Calcium: 8.5
Chloride: 106
Creatinine, Ser: 1.12
GFR calc Af Amer: >60
GFR calc non Af Amer: 56 — ABNORMAL LOW
Glucose, Bld: 89
Potassium: 3.5
Sodium: 135

## 2011-03-03 LAB — URINE MICROSCOPIC-ADD ON

## 2011-03-03 LAB — COMPREHENSIVE METABOLIC PANEL
Albumin: 2.7 — ABNORMAL LOW
BUN: 13
Creatinine, Ser: 1.24 — ABNORMAL HIGH
Glucose, Bld: 100 — ABNORMAL HIGH
Total Bilirubin: 1
Total Protein: 5.7 — ABNORMAL LOW

## 2011-03-03 LAB — CULTURE, ROUTINE-ABSCESS
Culture: NO GROWTH
Gram Stain: NONE SEEN

## 2011-03-03 LAB — GC/CHLAMYDIA PROBE AMP, GENITAL
Chlamydia, DNA Probe: NEGATIVE
GC Probe Amp, Genital: NEGATIVE

## 2011-03-04 LAB — BASIC METABOLIC PANEL
BUN: 5 — ABNORMAL LOW
CO2: 27
Calcium: 8.1 — ABNORMAL LOW
Chloride: 106
Creatinine, Ser: 0.79
GFR calc Af Amer: >60
GFR calc non Af Amer: >60
Glucose, Bld: 128 — ABNORMAL HIGH
Potassium: 3.1 — ABNORMAL LOW
Sodium: 140

## 2011-03-04 LAB — CBC
HCT: 31.7 — ABNORMAL LOW
Hemoglobin: 10.7 — ABNORMAL LOW
MCHC: 33.9
MCV: 92.5
Platelets: 126 — ABNORMAL LOW
RBC: 3.42 — ABNORMAL LOW
RDW: 12.9
WBC: 6.7

## 2011-03-12 LAB — URINE MICROSCOPIC-ADD ON

## 2011-03-12 LAB — URINALYSIS, ROUTINE W REFLEX MICROSCOPIC
Bilirubin Urine: NEGATIVE
Glucose, UA: NEGATIVE
Nitrite: POSITIVE — AB
Specific Gravity, Urine: 1.009
pH: 7

## 2011-03-12 LAB — PREGNANCY, URINE: Preg Test, Ur: NEGATIVE

## 2011-03-14 LAB — I-STAT 8, (EC8 V) (CONVERTED LAB)
BUN: 13
Glucose, Bld: 100 — ABNORMAL HIGH
Hemoglobin: 15.6 — ABNORMAL HIGH
Potassium: 3.7
Sodium: 136

## 2011-03-14 LAB — POCT CARDIAC MARKERS: Myoglobin, poc: 239

## 2011-03-14 LAB — POCT I-STAT CREATININE: Creatinine, Ser: 0.7

## 2011-03-20 NOTE — Op Note (Unsigned)
ROOM NUMBER:                           OPS OPS 12            SURGEON:                                Sinclair Grooms, MD            DATE:                                   11/27/2006                  PREOPERATIVE DIAGNOSIS:  Missed abortion.            POSTOPERATIVE DIAGNOSIS:  Missed abortion.            OPERATION:  Dilation and curettage with suction.            ASSISTANT:  None.            ANESTHESIA:  Local paracervical block with IV sedation.            ESTIMATED BLOOD LOSS:  Minimal.            COMPLICATIONS:  None.            SPECIMENS:  Products of conception, a portion of which will be sent in      Hanks medium for chromosome analysis.            FINDINGS:  Examination under anesthesia revealed a 8-cm uterus.  No adnexal      masses. Intraoperative findings included a uterus that sounded to 8 cm.      Products of conception were obtained at the end of the procedure.            PROCEDURE:  The patient was taken to the operating room, placed in supine      position. After adequate level IV sedation had been achieved, the patient      was placed in [place] stirrups.  The patient was then prepped and draped in      usual sterile manner.  Note, the patient voided just prior to the procedure      so her bladder were empty.            At this point, after the patient was prepped and draped in the lithotomy      position, attention was turned to the vagina where a speculum was placed.      Cervix was visualized.  Then 5 mL of 1% lidocaine plain was instilled into      the 12 o'clock position on the cervix, and then this area was grasped with      a single-tooth tenaculum.  Cervix was then deviated to the left, and 10 mL      of 1% lidocaine was infiltrated into the paracervical space at the 9      o'clock position.  The same procedure was performed on the contralateral      side to complete the paracervical block.            At this point, the using was sounded to 8 cm.  The endocervical canal was      successively  dilated using Hank dilators to accommodate  a #8 suction      curette.  A #8 suction curette was introduced to the level of the uterine      fundus and vacuum applied.  After adequate suction was obtained, the      suction curette was rotated in a 360-degree radius and successively      withdrawn from the endometrial cavity.  Two complete passes were performed      to evacuate the uterus of its contents.  A sharp curette was then used to      demonstrate a good uterine cry.  A third and final pass was then used to      evacuate any remaining blood clots.            At this point, satisfied with the curettage, the tenaculum was removed from      the cervix anteriorly.  Tenaculum sites were cauterized with silver nitrate      after adequate hemostasis was completely achieved.  With all vaginal      instruments removed, the patient was taken down from lithotomy position,      awakened, and transferred to recovery room in stable condition having      tolerated procedure well.                                    ______________________________     Date Signed: __________      Sinclair Grooms, MD            L A:[id number]      Dictated:    11/27/2006 12:54 P      Transcribed: 11/27/2006  2:35 P      Job Number: [id number]      Document Number: [id number]            CC:  Sinclair Grooms, MD

## 2011-03-21 NOTE — Op Note (Unsigned)
Account Number: [id number]      Document ID: [id number]      Admit Date: 02/02/2008      Procedure Date: 02/02/2008            Patient Location: K372-01      Patient Type: I            SURGEON: Sinclair Grooms MD      ASSISTANT:                  PREOPERATIVE DIAGNOSES:      Term pregnancy, previous cesarean section.            POSTOPERATIVE DIAGNOSES:      Term pregnancy, previous cesarean section, delivered.            TITLE OF PROCEDURE:      1.  Repeat low segment transverse cesarean section.      2.  Placement of On-Q pump.      3.  Stem cell harvesting.            ASSISTANT:      Stanford Scotland, RNFA            ANESTHESIA:      Spinal.            ESTIMATED BLOOD LOSS:      600 mL.            COMPLICATIONS:      None.            SPECIMENS:      None.            FINDINGS:      Live female infant. Apgars of 7 and 9.  Weight of 9 pounds 3 ounces.      Placenta manually removed, a 3-vessel cord.  Tubes and ovaries appeared      normal bilaterally.            DESCRIPTION OF PROCEDURE:      The patient was taken to the operating room and placed in the dorsal supine      position with left uterine displacement.  After an adequate level of spinal      anesthesia had been achieved, the patient was prepped and draped in the      usual sterile manner.  Foley catheter was inserted in the bladder.  The      adequacy of anesthesia was checked with Allis clamps on the skin.  When      adequate, a knife was used to make a Pfannenstiel incision through skin,      sharply extended through subcutaneous tissue to level of the fascia.  The      fascia was nicked, entered, and the incision sharply extended laterally      using Mayo scissors.  The fascia was sharply dissected off the rectus      muscle beds anteriorly.  Muscle bellies sharply separated in the midline.      Abdominal peritoneum was entered.  Incision was vertically extended using      Metzenbaum scissors.  The Alexis self-retaining retractor was then placed      into the incision.   A bladder flap was then created transversely across the      lower uterine segment and bluntly dissected off.            Then using a knife, a curvilinear incision made transversely across the      lower uterine segment.  Amniotic cavity  was entered, incision bluntly      extended laterally.  The vertex of the infant was identified and delivered      through the abdominal incision.  Bulb suction was used to clear the      oropharynx.  The rest of the infant was then easily delivered.  Cord was      clamped and cut and the infant passed to the waiting neonatologist.  Cord      blood was obtained.  Placenta was manually removed.  Uterus was cleaned      with moist lap tapes.  Uterus was then closed in a single layer using 0      Vicryl suture in a running interlocked fashion to achieve good hemostasis.      Peritoneal gutters were cleaned with moist lap tapes, and tubes and ovaries      were inspected bilaterally.  The uterine incision was inspected one last      time for hemostasis and when adequate, the incision was closed.            Please add, just prior to delivery of the placenta, stem cells were      harvested in the usual sterile manner.            At this point, the left aspect of the fascial incision was closed from apex      to midline using 0 Vicryl suture in a running continuous manner.  The On-Q      subfascial infusion pump was inserted superior and midline to the incision      and secured with Indermil, Steri-Strips and Tegaderm dressing.  The right      aspect of the fascial incision was closed from the apex to the midline to      complete the fascial closure.  Subcutaneous tissue inspected for hemostasis      and when adequate, skin edges were reapproximated using 4-0 Vicryl in      subcuticular fashion.  Sterile dressing placed across the incision.  All      counts were correct x2.  Clear urine was noted in Foley catheter.  The      patient was transferred to the recovery room in stable condition  having      tolerated the procedure well.                              _______________________________     Date/Time Signed: _____________      Sinclair Grooms MD [phone])            D:  02/02/2008 08:44 AM by Sinclair Grooms, MD [phone])      T:  02/02/2008 10:51 AM by BS          (Conf: [id number]) (Doc ID: [id number])                  cc:

## 2011-05-23 ENCOUNTER — Encounter (INDEPENDENT_AMBULATORY_CARE_PROVIDER_SITE_OTHER): Payer: Self-pay | Admitting: Internal Medicine

## 2011-06-11 DIAGNOSIS — F411 Generalized anxiety disorder: Secondary | ICD-10-CM | POA: Insufficient documentation

## 2011-06-11 DIAGNOSIS — D649 Anemia, unspecified: Secondary | ICD-10-CM | POA: Insufficient documentation

## 2011-06-11 DIAGNOSIS — R5383 Other fatigue: Secondary | ICD-10-CM | POA: Insufficient documentation

## 2011-08-20 DIAGNOSIS — L659 Nonscarring hair loss, unspecified: Secondary | ICD-10-CM | POA: Insufficient documentation

## 2011-09-24 ENCOUNTER — Inpatient Hospital Stay (HOSPITAL_COMMUNITY)
Admission: AD | Admit: 2011-09-24 | Discharge: 2011-09-24 | Disposition: A | Discharge status: Home / Self Care | Payer: Self-pay | Source: Ambulatory Visit | Attending: Obstetrics & Gynecology | Admitting: Obstetrics & Gynecology

## 2011-09-24 ENCOUNTER — Encounter (HOSPITAL_COMMUNITY): Payer: Self-pay

## 2011-09-24 DIAGNOSIS — B9689 Other specified bacterial agents as the cause of diseases classified elsewhere: Secondary | ICD-10-CM

## 2011-09-24 DIAGNOSIS — A499 Bacterial infection, unspecified: Secondary | ICD-10-CM

## 2011-09-24 DIAGNOSIS — R109 Unspecified abdominal pain: Secondary | ICD-10-CM | POA: Insufficient documentation

## 2011-09-24 DIAGNOSIS — N760 Acute vaginitis: Secondary | ICD-10-CM | POA: Insufficient documentation

## 2011-09-24 HISTORY — DX: Major depressive disorder, single episode, unspecified: F32.9

## 2011-09-24 HISTORY — DX: Carrier of group B Streptococcus: Z22.330

## 2011-09-24 HISTORY — DX: Other seasonal allergic rhinitis: J30.2

## 2011-09-24 HISTORY — DX: Reserved for concepts with insufficient information to code with codable children: IMO0002

## 2011-09-24 HISTORY — DX: Depression, unspecified: F32.A

## 2011-09-24 LAB — URINALYSIS, ROUTINE W REFLEX MICROSCOPIC
Glucose, UA: NEGATIVE mg/dL
Leukocytes, UA: NEGATIVE
pH: 6 (ref 5.0–8.0)

## 2011-09-24 LAB — URINE MICROSCOPIC-ADD ON

## 2011-09-24 LAB — POCT PREGNANCY, URINE: Preg Test, Ur: NEGATIVE

## 2011-09-24 LAB — WET PREP, GENITAL
Trich, Wet Prep: NONE SEEN
Yeast Wet Prep HPF POC: NONE SEEN

## 2011-09-24 MED ORDER — METRONIDAZOLE 500 MG PO TABS: 500.0000 mg | ORAL_TABLET | Freq: Two times a day (BID) | ORAL | Status: AC

## 2011-09-24 NOTE — MAU Provider Note (Signed)
History     CSN: [id number]  Arrival date and time: 09/24/11 1627   First Provider Initiated Contact with Patient 09/24/11 1753      Chief Complaint  Patient presents with  . Abdominal Pain  . Vaginal Discharge   HPIKimberly Karie Schwalbe Laural Hernandez is 36 y.o. [phone] Unknown weeks presenting with report that she ran into a concrete pole 2 months ago at work.  She is having vaginal discharge with blood in it/odor  and vaginal pressure.  ? Frequency of urination.  Sexually active X 1 year with 1 partner. Not using contraception.  LMP  4/15 Was very light  Past Medical History  Diagnosis Date  . 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 Depression   . Seasonal allergies   . Abnormal Pap smear     Past Surgical History  Procedure Date  . Colposcopy   . Wisdom tooth extraction     Family History  Problem Relation Age of Onset  . Anesthesia problems Neg Hx   . Hypotension Neg Hx   . Malignant hyperthermia Neg Hx   . Pseudochol deficiency Neg Hx     History  Substance Use Topics  . Smoking status: Current Everyday Smoker -- 0.5 packs/day    Types: Cigarettes  . Smokeless tobacco: Never Used  . Alcohol Use: 0.6 oz/week    1 Cans of beer per week     occasional     Allergies:  Allergies  Allergen Reactions  . Morphine And Related Itching    Had severe itching and was given benadryl.     No prescriptions prior to admission    Review of Systems  Constitutional: Negative.   Gastrointestinal: Negative for nausea, vomiting and abdominal pain.  Genitourinary:       + vaginal discharge with odor and pink in color + for suprapubic pain   Physical Exam   Blood pressure 139/90, pulse 92, temperature 98 F (36.7 C), temperature source Oral, resp. rate 18, height 5' 4.25" (1.632 m), weight 79.55 kg (175 lb 6 oz), last menstrual period 09/15/2011, SpO2 100.00%.  Physical Exam  Constitutional: She is oriented to person, place, and time. She appears  well-developed and well-nourished. No distress.  HENT:  Head: Normocephalic.  Neck: Normal range of motion.  Respiratory: Effort normal.  GI: Soft. She exhibits no mass. There is no tenderness. There is no rebound and no guarding.  Genitourinary: Uterus is not enlarged and not tender. Cervix exhibits no discharge and no friability. Right adnexum displays no mass, no tenderness and no fullness. Left adnexum displays no mass, no tenderness and no fullness. No bleeding around the vagina. Vaginal discharge (pinkish in color with odor) found.  Neurological: She is alert and oriented to person, place, and time.  Skin: Skin is warm and dry.  Psychiatric: She has a normal mood and affect. Her behavior is normal.   Results for orders placed during the hospital encounter of 09/24/11 (from the past 24 hour(s))  URINALYSIS, ROUTINE W REFLEX MICROSCOPIC     Status: Abnormal   Collection Time   09/24/11  4:43 PM      Component Value Range   Color, Urine YELLOW  YELLOW    APPearance CLEAR  CLEAR    Specific Gravity, Urine <1.005 (*) 1.005 - 1.030    pH 6.0  5.0 - 8.0    Glucose, UA NEGATIVE  NEGATIVE (mg/dL)   Hgb urine dipstick TRACE (*) NEGATIVE  Bilirubin Urine NEGATIVE  NEGATIVE    Ketones, ur NEGATIVE  NEGATIVE (mg/dL)   Protein, ur NEGATIVE  NEGATIVE (mg/dL)   Urobilinogen, UA 0.2  0.0 - 1.0 (mg/dL)   Nitrite NEGATIVE  NEGATIVE    Leukocytes, UA NEGATIVE  NEGATIVE   URINE MICROSCOPIC-ADD ON     Status: Normal   Collection Time   09/24/11  4:43 PM      Component Value Range   Squamous Epithelial / LPF RARE  RARE    RBC / HPF 0-2  <3 (RBC/hpf)  POCT PREGNANCY, URINE     Status: Normal   Collection Time   09/24/11  4:54 PM      Component Value Range   Preg Test, Ur NEGATIVE  NEGATIVE   WET PREP, GENITAL     Status: Abnormal   Collection Time   09/24/11  6:04 PM      Component Value Range   Yeast Wet Prep HPF POC NONE SEEN  NONE SEEN    Trich, Wet Prep NONE SEEN  NONE SEEN    Clue Cells  Wet Prep HPF POC FEW (*) NONE SEEN    WBC, Wet Prep HPF POC FEW (*) NONE SEEN    MAU Course  Procedures GC/CHL culture to lab  MDM   Assessment and Plan  A:  Bacterial vaginosis  P: Rx for Flagyl      Instructed no sex or alcohol use for 1 week while on medication.  Michell Giuliano,EVE M 09/24/2011, 5:54 PM

## 2011-09-24 NOTE — Discharge Instructions (Signed)
Bacterial Vaginosis Bacterial vaginosis (BV) is a vaginal infection where the normal balance of bacteria in the vagina is disrupted. The normal balance is then replaced by an overgrowth of certain bacteria. There are several different kinds of bacteria that can cause BV. BV is the most common vaginal infection in women of childbearing age. CAUSES   The cause of BV is not fully understood. BV develops when there is an increase or imbalance of harmful bacteria.   Some activities or behaviors can upset the normal balance of bacteria in the vagina and put women at increased risk including:   Having a new sex partner or multiple sex partners.   Douching.   Using an intrauterine device (IUD) for contraception.   It is not clear what role sexual activity plays in the development of BV. However, women that have never had sexual intercourse are rarely infected with BV.  Women do not get BV from toilet seats, bedding, swimming pools or from touching objects around them.  SYMPTOMS   Grey vaginal discharge.   A fish-like odor with discharge, especially after sexual intercourse.   Itching or burning of the vagina and vulva.   Burning or pain with urination.   Some women have no signs or symptoms at all.  DIAGNOSIS  Your caregiver must examine the vagina for signs of BV. Your caregiver will perform lab tests and look at the sample of vaginal fluid through a microscope. They will look for bacteria and abnormal cells (clue cells), a pH test higher than 4.5, and a positive amine test all associated with BV.  RISKS AND COMPLICATIONS   Pelvic inflammatory disease (PID).   Infections following gynecology surgery.   Developing HIV.   Developing herpes virus.  TREATMENT  Sometimes BV will clear up without treatment. However, all women with symptoms of BV should be treated to avoid complications, especially if gynecology surgery is planned. Female partners generally do not need to be treated. However,  BV may spread between female sex partners so treatment is helpful in preventing a recurrence of BV.   BV may be treated with antibiotics. The antibiotics come in either pill or vaginal cream forms. Either can be used with nonpregnant or pregnant women, but the recommended dosages differ. These antibiotics are not harmful to the baby.   BV can recur after treatment. If this happens, a second round of antibiotics will often be prescribed.   Treatment is important for pregnant women. If not treated, BV can cause a premature delivery, especially for a pregnant woman who had a premature birth in the past. All pregnant women who have symptoms of BV should be checked and treated.   For chronic reoccurrence of BV, treatment with a type of prescribed gel vaginally twice a week is helpful.  HOME CARE INSTRUCTIONS   Finish all medication as directed by your caregiver.   Do not have sex until treatment is completed.   Tell your sexual partner that you have a vaginal infection. They should see their caregiver and be treated if they have problems, such as a mild rash or itching.   Practice safe sex. Use condoms. Only have 1 sex partner.  PREVENTION  Basic prevention steps can help reduce the risk of upsetting the natural balance of bacteria in the vagina and developing BV:  Do not have sexual intercourse (be abstinent).   Do not douche.   Use all of the medicine prescribed for treatment of BV, even if the signs and symptoms go away.     Tell your sex partner if you have BV. That way, they can be treated, if needed, to prevent reoccurrence.  SEEK MEDICAL CARE IF:   Your symptoms are not improving after 3 days of treatment.   You have increased discharge, pain, or fever.  MAKE SURE YOU:   Understand these instructions.   Will watch your condition.   Will get help right away if you are not doing well or get worse.  FOR MORE INFORMATION  Division of STD Prevention (DSTDP), Centers for Disease  Control and Prevention: www.cdc.gov/std American [employer] Health Association (ASHA): www.ashastd.org  Document Released: 05/19/2005 Document Revised: 05/08/2011 Document Reviewed: 11/09/2008 Exit[employer] Patient Information 2012 [employer]. 

## 2011-09-24 NOTE — MAU Note (Signed)
Pt states LMP this month lasting 2 days. Notes vaginal discharge 2 days ago, cloudy white, mild odor. Has felt abdominal pressure as well as pressure in her suprapubic area for 3 days. Hit a concrete pole while working a few months ago and hit her abdomen. Is curious if that is part of pain.

## 2011-09-25 NOTE — MAU Provider Note (Signed)
Attestation of Attending Supervision of Advanced Practitioner: Evaluation and management procedures were performed by the OB Fellow/PA/CNM/NP under my supervision and collaboration. Chart reviewed, and agree with management and plan.  Olina Melfi, M.D. 09/25/2011 11:02 AM   

## 2011-12-08 ENCOUNTER — Encounter (HOSPITAL_COMMUNITY): Payer: Self-pay | Admitting: *Deleted

## 2011-12-08 ENCOUNTER — Emergency Department (HOSPITAL_COMMUNITY)
Admission: EM | Admit: 2011-12-08 | Discharge: 2011-12-08 | Disposition: A | Discharge status: Home / Self Care | Payer: Self-pay | Attending: Emergency Medicine | Admitting: Emergency Medicine

## 2011-12-08 DIAGNOSIS — N760 Acute vaginitis: Secondary | ICD-10-CM | POA: Insufficient documentation

## 2011-12-08 DIAGNOSIS — R102 Pelvic and perineal pain: Secondary | ICD-10-CM

## 2011-12-08 DIAGNOSIS — B9689 Other specified bacterial agents as the cause of diseases classified elsewhere: Secondary | ICD-10-CM | POA: Insufficient documentation

## 2011-12-08 DIAGNOSIS — A499 Bacterial infection, unspecified: Secondary | ICD-10-CM | POA: Insufficient documentation

## 2011-12-08 DIAGNOSIS — N949 Unspecified condition associated with female genital organs and menstrual cycle: Secondary | ICD-10-CM | POA: Insufficient documentation

## 2011-12-08 DIAGNOSIS — R10813 Right lower quadrant abdominal tenderness: Secondary | ICD-10-CM | POA: Insufficient documentation

## 2011-12-08 LAB — CBC WITH DIFFERENTIAL/PLATELET
Basophils Absolute: 0 10*3/uL (ref 0.0–0.1)
Basophils Relative: 0 % (ref 0–1)
Eosinophils Absolute: 0.2 10*3/uL (ref 0.0–0.7)
Eosinophils Relative: 2 % (ref 0–5)
HCT: 41.8 % (ref 36.0–46.0)
Hemoglobin: 14 g/dL (ref 12.0–15.0)
Lymphocytes Relative: 35 % (ref 12–46)
Lymphs Abs: 2.7 10*3/uL (ref 0.7–4.0)
MCH: 31.7 pg (ref 26.0–34.0)
MCHC: 33.5 g/dL (ref 30.0–36.0)
MCV: 94.6 fL (ref 78.0–100.0)
Monocytes Absolute: 0.6 10*3/uL (ref 0.1–1.0)
Monocytes Relative: 8 % (ref 3–12)
Neutro Abs: 4.3 10*3/uL (ref 1.7–7.7)
Neutrophils Relative %: 55 % (ref 43–77)
Platelets: 171 10*3/uL (ref 150–400)
RBC: 4.42 MIL/uL (ref 3.87–5.11)
RDW: 12.5 % (ref 11.5–15.5)
WBC: 7.8 10*3/uL (ref 4.0–10.5)

## 2011-12-08 LAB — URINALYSIS, ROUTINE W REFLEX MICROSCOPIC
Glucose, UA: NEGATIVE mg/dL
Specific Gravity, Urine: 1.016 (ref 1.005–1.030)
Urobilinogen, UA: 0.2 mg/dL (ref 0.0–1.0)
pH: 7 (ref 5.0–8.0)

## 2011-12-08 LAB — BASIC METABOLIC PANEL WITH GFR
BUN: 11 mg/dL (ref 6–23)
CO2: 26 meq/L (ref 19–32)
Calcium: 8.9 mg/dL (ref 8.4–10.5)
Chloride: 102 meq/L (ref 96–112)
Creatinine, Ser: 0.65 mg/dL (ref 0.50–1.10)
GFR calc Af Amer: >90 mL/min
GFR calc non Af Amer: >90 mL/min
Glucose, Bld: 111 mg/dL — ABNORMAL HIGH (ref 70–99)
Potassium: 3.5 meq/L (ref 3.5–5.1)
Sodium: 135 meq/L (ref 135–145)

## 2011-12-08 LAB — WET PREP, GENITAL
Trich, Wet Prep: NONE SEEN
Yeast Wet Prep HPF POC: NONE SEEN

## 2011-12-08 LAB — URINE MICROSCOPIC-ADD ON

## 2011-12-08 LAB — PREGNANCY, URINE: Preg Test, Ur: NEGATIVE

## 2011-12-08 MED ORDER — METRONIDAZOLE 500 MG PO TABS: 500.0000 mg | ORAL_TABLET | Freq: Two times a day (BID) | ORAL | Status: AC

## 2011-12-08 MED ORDER — HYDROCODONE-ACETAMINOPHEN 5-325 MG PO TABS
1.0000 | ORAL_TABLET | Freq: Once | ORAL | Status: AC
  Administered 2011-12-08: 1 via ORAL
  Filled 2011-12-08: qty 1

## 2011-12-08 MED ORDER — TRAMADOL HCL 50 MG PO TABS: 50.0000 mg | ORAL_TABLET | Freq: Four times a day (QID) | ORAL | Status: AC | PRN

## 2011-12-08 MED ORDER — TRAMADOL HCL 50 MG PO TABS: 50.0000 mg | ORAL_TABLET | Freq: Four times a day (QID) | ORAL | Status: DC | PRN

## 2011-12-08 NOTE — ED Notes (Signed)
Pt states that she has been N/V for the last 3-4 days. Pt says she has not been able to eat much. Pt believes that stress r/t a recent separation from her husband is likely the cause for her s/s. Pt denies diarrhea exposure to any illness or food related causes. Pt denies fever.

## 2011-12-08 NOTE — ED Provider Notes (Signed)
Medical screening examination/treatment/procedure(s) were performed by non-physician practitioner and as supervising physician I was immediately available for consultation/collaboration.  Juliet Rude. Rubin Payor, MD 12/08/11 2330

## 2011-12-08 NOTE — ED Notes (Signed)
Pt states she hx of UTIs with bloody discharge. Pt begin having pain and bloody discharge last Sunday 6/30. Pt states she finished her period and has continued to have discharge.

## 2011-12-08 NOTE — ED Provider Notes (Signed)
History     CSN: [id number]  Arrival date & time 12/08/11  1757   First MD Initiated Contact with Patient 12/08/11 2035      Chief Complaint  Patient presents with  . Abdominal Pain   HPI  History provided by the patient. Patient is a 36 year old female with no significant past medical history who presents with complaints of lower abdominal pain, dysuria vaginal bleeding that began several days ago. Patient states that she had some abnormal bleeding and discharge prior to her menstrual cycle earlier this month. Patient then had a normal menstrual cycle to completion and has not had any recurrent bleeding or discharge. Patient also developed some lower abdominal pain and discomfort during that time chose waxing and waning has become worse and more persistent. Pain is primarily on the right lower pelvic area. Patient reports history of abnormal Pap smear in the past but denies any other gynecological problems. Symptoms are described as moderate. Patient is not use any medications for symptoms. She denies any other associated symptoms. Denies any fever, chills, sweats, nausea, vomiting, diarrhea constipation.    Past Medical History  Diagnosis Date  . 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 Depression   . Seasonal allergies   . Abnormal Pap smear     Past Surgical History  Procedure Date  . Colposcopy   . Wisdom tooth extraction     Family History  Problem Relation Age of Onset  . Anesthesia problems Neg Hx   . Hypotension Neg Hx   . Malignant hyperthermia Neg Hx   . Pseudochol deficiency Neg Hx     History  Substance Use Topics  . Smoking status: Current Everyday Smoker -- 0.5 packs/day    Types: Cigarettes  . Smokeless tobacco: Never Used  . Alcohol Use: 0.6 oz/week    1 Cans of beer per week     occasional     OB History    Grav Para Term Preterm Abortions TAB SAB Ect Mult Living   3 3 3       3       Review of Systems    Constitutional: Negative for fever, chills and appetite change.  Gastrointestinal: Positive for abdominal pain. Negative for vomiting, diarrhea and constipation.  Genitourinary: Positive for dysuria, vaginal bleeding and vaginal discharge. Negative for frequency, hematuria and flank pain.    Allergies  Morphine and related  Home Medications   Current Outpatient Rx  Name Route Sig Dispense Refill  . ASPIRIN-ACETAMINOPHEN-CAFFEINE 250-250-65 MG PO TABS Oral Take 4 tablets by mouth daily as needed. For pain.      BP 114/76  Pulse 60  Temp 98.3 F (36.8 C) (Oral)  Resp 16  Ht 5\' 4"  (1.626 m)  SpO2 99%  LMP 12/02/2011  Physical Exam  Nursing note and vitals reviewed. Constitutional: She is oriented to person, place, and time. She appears well-developed and well-nourished. No distress.  HENT:  Head: Normocephalic.  Cardiovascular: Normal rate and regular rhythm.   Pulmonary/Chest: Effort normal and breath sounds normal. No respiratory distress. She has no wheezes.  Abdominal: Soft. There is tenderness in the right lower quadrant. There is no rebound, no guarding, no CVA tenderness and no tenderness at McBurney's point.  Genitourinary:       Chaperone was present. Very small amount of dark vaginal discharge. There is mild to moderate right adnexal tenderness. No large masses on the right ovary appreciated. No enlarged uterus.  Pain is reproducible of patient's symptoms.  Neurological: She is alert and oriented to person, place, and time.  Skin: Skin is warm and dry.  Psychiatric: She has a normal mood and affect. Her behavior is normal.    ED Course  Procedures   Results for orders placed during the hospital encounter of 12/08/11  URINALYSIS, ROUTINE W REFLEX MICROSCOPIC      Component Value Range   Color, Urine YELLOW  YELLOW   APPearance CLOUDY (*) CLEAR   Specific Gravity, Urine 1.016  1.005 - 1.030   pH 7.0  5.0 - 8.0   Glucose, UA NEGATIVE  NEGATIVE mg/dL   Hgb urine  dipstick TRACE (*) NEGATIVE   Bilirubin Urine NEGATIVE  NEGATIVE   Ketones, ur NEGATIVE  NEGATIVE mg/dL   Protein, ur NEGATIVE  NEGATIVE mg/dL   Urobilinogen, UA 0.2  0.0 - 1.0 mg/dL   Nitrite NEGATIVE  NEGATIVE   Leukocytes, UA TRACE (*) NEGATIVE  PREGNANCY, URINE      Component Value Range   Preg Test, Ur NEGATIVE  NEGATIVE  URINE MICROSCOPIC-ADD ON      Component Value Range   Squamous Epithelial / LPF RARE  RARE   WBC, UA 0-2  <3 WBC/hpf   Bacteria, UA MANY (*) RARE  WET PREP, GENITAL      Component Value Range   Yeast Wet Prep HPF POC NONE SEEN  NONE SEEN   Trich, Wet Prep NONE SEEN  NONE SEEN   Clue Cells Wet Prep HPF POC MANY (*) NONE SEEN   WBC, Wet Prep HPF POC MODERATE (*) NONE SEEN  CBC WITH DIFFERENTIAL      Component Value Range   WBC 7.8  4.0 - 10.5 K/uL   RBC 4.42  3.87 - 5.11 MIL/uL   Hemoglobin 14.0  12.0 - 15.0 g/dL   HCT 16.1  09.6 - 04.5 %   MCV 94.6  78.0 - 100.0 fL   MCH 31.7  26.0 - 34.0 pg   MCHC 33.5  30.0 - 36.0 g/dL   RDW 40.9  81.1 - 91.4 %   Platelets 171  150 - 400 K/uL   Neutrophils Relative 55  43 - 77 %   Neutro Abs 4.3  1.7 - 7.7 K/uL   Lymphocytes Relative 35  12 - 46 %   Lymphs Abs 2.7  0.7 - 4.0 K/uL   Monocytes Relative 8  3 - 12 %   Monocytes Absolute 0.6  0.1 - 1.0 K/uL   Eosinophils Relative 2  0 - 5 %   Eosinophils Absolute 0.2  0.0 - 0.7 K/uL   Basophils Relative 0  0 - 1 %   Basophils Absolute 0.0  0.0 - 0.1 K/uL  BASIC METABOLIC PANEL      Component Value Range   Sodium 135  135 - 145 mEq/L   Potassium 3.5  3.5 - 5.1 mEq/L   Chloride 102  96 - 112 mEq/L   CO2 26  19 - 32 mEq/L   Glucose, Bld 111 (*) 70 - 99 mg/dL   BUN 11  6 - 23 mg/dL   Creatinine, Ser 7.82  0.50 - 1.10 mg/dL   Calcium 8.9  8.4 - 95.6 mg/dL   GFR calc non Af Amer >90  >90 mL/min   GFR calc Af Amer >90  >90 mL/min      No results found.   1. Bacterial vaginosis   2. Pelvic pain in female  MDM  8:40PM patient seen and evaluated.  Patient no acute distress.   Patient with mild reproducible pain in right adnexa on exam. There is no significant large mass or other concerning findings on pelvic exam. I discussed with patient options for ultrasound study for further evaluation of symptoms. Symptoms were not acute or sudden and ovarian torsion seems unlikely. Patient however does not wish to have ultrasound study. She prefers to followup outpatient with an OB/GYN specialist. Patient was given strict return precautions.    Angus Seller, [place] 12/08/11 2251

## 2011-12-08 NOTE — ED Notes (Signed)
Pt c/o right before she started her menstrual cycle on 7/2 she had a discharge and lower abd pain in the mornings/ continues with bloody discharge with odor.  Burning with urination.

## 2011-12-08 NOTE — ED Notes (Signed)
Pt. Is ready for pelvic exam

## 2011-12-09 LAB — GC/CHLAMYDIA PROBE AMP, GENITAL: GC Probe Amp, Genital: NEGATIVE

## 2012-03-03 ENCOUNTER — Encounter (HOSPITAL_COMMUNITY): Payer: Self-pay | Admitting: Emergency Medicine

## 2012-03-03 ENCOUNTER — Emergency Department (HOSPITAL_COMMUNITY)
Admission: EM | Admit: 2012-03-03 | Discharge: 2012-03-03 | Disposition: A | Discharge status: Home / Self Care | Payer: Self-pay | Attending: Emergency Medicine | Admitting: Emergency Medicine

## 2012-03-03 DIAGNOSIS — R300 Dysuria: Secondary | ICD-10-CM | POA: Insufficient documentation

## 2012-03-03 DIAGNOSIS — F17200 Nicotine dependence, unspecified, uncomplicated: Secondary | ICD-10-CM | POA: Insufficient documentation

## 2012-03-03 DIAGNOSIS — N898 Other specified noninflammatory disorders of vagina: Secondary | ICD-10-CM | POA: Insufficient documentation

## 2012-03-03 DIAGNOSIS — N390 Urinary tract infection, site not specified: Secondary | ICD-10-CM | POA: Insufficient documentation

## 2012-03-03 DIAGNOSIS — R109 Unspecified abdominal pain: Secondary | ICD-10-CM | POA: Insufficient documentation

## 2012-03-03 DIAGNOSIS — R110 Nausea: Secondary | ICD-10-CM | POA: Insufficient documentation

## 2012-03-03 DIAGNOSIS — R10819 Abdominal tenderness, unspecified site: Secondary | ICD-10-CM | POA: Insufficient documentation

## 2012-03-03 DIAGNOSIS — F3289 Other specified depressive episodes: Secondary | ICD-10-CM | POA: Insufficient documentation

## 2012-03-03 DIAGNOSIS — F329 Major depressive disorder, single episode, unspecified: Secondary | ICD-10-CM | POA: Insufficient documentation

## 2012-03-03 DIAGNOSIS — IMO0002 Reserved for concepts with insufficient information to code with codable children: Secondary | ICD-10-CM | POA: Insufficient documentation

## 2012-03-03 LAB — CBC WITH DIFFERENTIAL/PLATELET
Basophils Relative: 0 % (ref 0–1)
Eosinophils Absolute: 0.1 10*3/uL (ref 0.0–0.7)
Eosinophils Relative: 1 % (ref 0–5)
HCT: 44.5 % (ref 36.0–46.0)
Hemoglobin: 15.1 g/dL — ABNORMAL HIGH (ref 12.0–15.0)
MCH: 31.3 pg (ref 26.0–34.0)
MCHC: 33.9 g/dL (ref 30.0–36.0)
Monocytes Absolute: 0.6 10*3/uL (ref 0.1–1.0)
Monocytes Relative: 7 % (ref 3–12)

## 2012-03-03 LAB — URINALYSIS, ROUTINE W REFLEX MICROSCOPIC
Glucose, UA: NEGATIVE mg/dL
Protein, ur: NEGATIVE mg/dL
pH: 6 (ref 5.0–8.0)

## 2012-03-03 LAB — WET PREP, GENITAL
Trich, Wet Prep: NONE SEEN
Yeast Wet Prep HPF POC: NONE SEEN

## 2012-03-03 LAB — COMPREHENSIVE METABOLIC PANEL
Albumin: 3.6 g/dL (ref 3.5–5.2)
BUN: 13 mg/dL (ref 6–23)
Creatinine, Ser: 0.67 mg/dL (ref 0.50–1.10)
Total Bilirubin: 0.3 mg/dL (ref 0.3–1.2)
Total Protein: 6.9 g/dL (ref 6.0–8.3)

## 2012-03-03 LAB — POCT PREGNANCY, URINE: Preg Test, Ur: NEGATIVE

## 2012-03-03 LAB — URINE MICROSCOPIC-ADD ON

## 2012-03-03 MED ORDER — ONDANSETRON HCL 4 MG/2ML IJ SOLN
4.0000 mg | Freq: Once | INTRAMUSCULAR | Status: DC
  Filled 2012-03-03: qty 2

## 2012-03-03 MED ORDER — SULFAMETHOXAZOLE-TMP DS 800-160 MG PO TABS
1.0000 | ORAL_TABLET | Freq: Once | ORAL | Status: AC
  Administered 2012-03-03: 1 via ORAL
  Filled 2012-03-03: qty 1

## 2012-03-03 MED ORDER — SULFAMETHOXAZOLE-TRIMETHOPRIM 800-160 MG PO TABS: 1.0000 | ORAL_TABLET | Freq: Two times a day (BID) | ORAL | Status: DC

## 2012-03-03 MED ORDER — OXYCODONE-ACETAMINOPHEN 5-325 MG PO TABS
1.0000 | ORAL_TABLET | Freq: Once | ORAL | Status: AC
  Administered 2012-03-03: 1 via ORAL
  Filled 2012-03-03: qty 1

## 2012-03-03 NOTE — Discharge Instructions (Signed)
Urinary Tract Infection Urinary tract infections (UTIs) can develop anywhere along your urinary tract. Your urinary tract is your body's drainage system for removing wastes and extra water. Your urinary tract includes two kidneys, two ureters, a bladder, and a urethra. Your kidneys are a pair of bean-shaped organs. Each kidney is about the size of your fist. They are located below your ribs, one on each side of your spine. CAUSES Infections are caused by microbes, which are microscopic organisms, including fungi, viruses, and bacteria. These organisms are so small that they can only be seen through a microscope. Bacteria are the microbes that most commonly cause UTIs. SYMPTOMS  Symptoms of UTIs may vary by age and gender of the patient and by the location of the infection. Symptoms in young women typically include a frequent and intense urge to urinate and a painful, burning feeling in the bladder or urethra during urination. Older women and men are more likely to be tired, shaky, and weak and have muscle aches and abdominal pain. A fever may mean the infection is in your kidneys. Other symptoms of a kidney infection include pain in your back or sides below the ribs, nausea, and vomiting. DIAGNOSIS To diagnose a UTI, your caregiver will ask you about your symptoms. Your caregiver also will ask to provide a urine sample. The urine sample will be tested for bacteria and white blood cells. White blood cells are made by your body to help fight infection. TREATMENT  Typically, UTIs can be treated with medication. Because most UTIs are caused by a bacterial infection, they usually can be treated with the use of antibiotics. The choice of antibiotic and length of treatment depend on your symptoms and the type of bacteria causing your infection. HOME CARE INSTRUCTIONS  If you were prescribed antibiotics, take them exactly as your caregiver instructs you. Finish the medication even if you feel better after you  have only taken some of the medication.  Drink enough water and fluids to keep your urine clear or pale yellow.  Avoid caffeine, tea, and carbonated beverages. They tend to irritate your bladder.  Empty your bladder often. Avoid holding urine for long periods of time.  Empty your bladder before and after sexual intercourse.  After a bowel movement, women should cleanse from front to back. Use each tissue only once. SEEK MEDICAL CARE IF:   You have back pain.  You develop a fever.  Your symptoms do not begin to resolve within 3 days. SEEK IMMEDIATE MEDICAL CARE IF:   You have severe back pain or lower abdominal pain.  You develop chills.  You have nausea or vomiting.  You have continued burning or discomfort with urination. MAKE SURE YOU:   Understand these instructions.  Will watch your condition.  Will get help right away if you are not doing well or get worse. Document Released: 02/26/2005 Document Revised: 11/18/2011 Document Reviewed: 06/27/2011 [employer] Patient Information 2013 [employer].  

## 2012-03-03 NOTE — ED Provider Notes (Signed)
History     CSN: [id number]  Arrival date & time 03/03/12  1350   First MD Initiated Contact with Patient 03/03/12 1505      Chief Complaint  Patient presents with  . Flank Pain    left flank  . Abdominal Pain    (Consider location/radiation/quality/duration/timing/severity/associated sxs/prior treatment) Patient is a 36 y.o. female presenting with flank pain, abdominal pain, and dysuria. The history is provided by the patient.  Flank Pain This is a new problem. The current episode started yesterday. The problem occurs constantly. The problem has not changed since onset.Associated symptoms include abdominal pain. Pertinent negatives include no shortness of breath. Exacerbated by: urinating. Nothing relieves the symptoms. She has tried nothing for the symptoms. The treatment provided no relief.  Abdominal Pain The primary symptoms of the illness include abdominal pain, nausea, dysuria and vaginal discharge. The primary symptoms of the illness do not include shortness of breath, vomiting or vaginal bleeding.  The dysuria is associated with discharge and dyspareunia.  The vaginal discharge is associated with dysuria and dyspareunia.   Dysuria  This is a new problem. The current episode started yesterday. The problem occurs every urination. The problem has not changed since onset.The quality of the pain is described as burning. The pain is at a severity of 5/10. The pain is moderate. There has been no fever. She is sexually active (new vaginal d/c for the last 2-3 days). There is no history of pyelonephritis. Associated symptoms include nausea, discharge and flank pain. Pertinent negatives include no vomiting. She has tried nothing for the symptoms. Her past medical history is significant for recurrent UTIs.    Past Medical History  Diagnosis Date  . 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 Depression   . Seasonal allergies   . Abnormal Pap smear     Past  Surgical History  Procedure Date  . Colposcopy   . Wisdom tooth extraction     Family History  Problem Relation Age of Onset  . Anesthesia problems Neg Hx   . Hypotension Neg Hx   . Malignant hyperthermia Neg Hx   . Pseudochol deficiency Neg Hx     History  Substance Use Topics  . Smoking status: Current Every Day Smoker -- 0.5 packs/day    Types: Cigarettes  . Smokeless tobacco: Never Used  . Alcohol Use: 0.6 oz/week    1 Cans of beer per week     occasional     OB History    Grav Para Term Preterm Abortions TAB SAB Ect Mult Living   3 3 3       3       Review of Systems  Respiratory: Negative for shortness of breath.   Gastrointestinal: Positive for nausea and abdominal pain. Negative for vomiting.  Genitourinary: Positive for dysuria, flank pain, vaginal discharge and dyspareunia. Negative for vaginal bleeding.  All other systems reviewed and are negative.    Allergies  Morphine and related  Home Medications  No current outpatient prescriptions on file.  BP 127/80  Pulse 84  Temp 98.7 F (37.1 C) (Oral)  Resp 20  Ht 5\' 4"  (1.626 m)  Wt 171 lb 9.6 oz (77.837 kg)  BMI 29.45 kg/m2  SpO2 99%  Physical Exam  Nursing note and vitals reviewed. Constitutional: She is oriented to person, place, and time. She appears well-developed and well-nourished. No distress.  HENT:  Head: Normocephalic and atraumatic.  Mouth/Throat: Oropharynx  is clear and moist.  Eyes: Conjunctivae normal and EOM are normal. Pupils are equal, round, and reactive to light.  Neck: Normal range of motion. Neck supple.  Cardiovascular: Normal rate, regular rhythm and intact distal pulses.   No murmur heard. Pulmonary/Chest: Effort normal and breath sounds normal. No respiratory distress. She has no wheezes. She has no rales.  Abdominal: Soft. She exhibits no distension. There is tenderness in the suprapubic area. There is no rebound, no guarding and no CVA tenderness.  Genitourinary:  Uterus normal. Cervix exhibits discharge. Cervix exhibits no motion tenderness and no friability. Right adnexum displays no mass, no tenderness and no fullness. Left adnexum displays no mass, no tenderness and no fullness. No tenderness around the vagina. Vaginal discharge found.  Musculoskeletal: Normal range of motion. She exhibits no edema and no tenderness.       Back:  Neurological: She is alert and oriented to person, place, and time.  Skin: Skin is warm and dry. No rash noted. No erythema.  Psychiatric: She has a normal mood and affect. Her behavior is normal.    ED Course  Procedures (including critical care time)  Labs Reviewed  CBC WITH DIFFERENTIAL - Abnormal; Notable for the following:    Hemoglobin 15.1 (*)     All other components within normal limits  URINALYSIS, ROUTINE W REFLEX MICROSCOPIC - Abnormal; Notable for the following:    Leukocytes, UA TRACE (*)     All other components within normal limits  WET PREP, GENITAL - Abnormal; Notable for the following:    Clue Cells Wet Prep HPF POC FEW (*)     WBC, Wet Prep HPF POC RARE (*)     All other components within normal limits  URINE MICROSCOPIC-ADD ON - Abnormal; Notable for the following:    Squamous Epithelial / LPF FEW (*)     Bacteria, UA MANY (*)     All other components within normal limits  COMPREHENSIVE METABOLIC PANEL  POCT PREGNANCY, URINE  GC/CHLAMYDIA PROBE AMP, GENITAL   No results found.   1. UTI (lower urinary tract infection)       MDM   Patient is complaining of lower abdominal pain and lower back pain bilaterally with new vaginal discharge. She states she is sexually active with one partner however is concerned that he may not be faithful. On exam she has mild pelvic tenderness and mild lumbar tenderness without rebound or guarding or concern for appendicitis, pancreatitis or diverticulitis. On pelvic exam patient does have copious discharge without cervical motion tenderness or focal adnexal  tenderness concerning for a TOA.  Symptoms seem unusual for her kidney stone. However patient does have a history of frequent UTIs.  No symptoms today suggestive of pyelonephritis  CBC, CMP, UA, wet prep, GC Chlamydia pending  4:16 PM UA consistent with UTI.  Wet prep wnl.  Most likely UTI causing sx today.  Will treat with bactrim.     Crystal Sprout, MD 03/03/12 1721

## 2012-03-03 NOTE — ED Notes (Addendum)
Patient states she began having left flank pain, lower back pain, and lower abdominal pain yesterday.  Patient has also had decreased output.  Patient reports chills and intermittent nausea.  Patient denies vomiting and diarrhea.  Patient reports h/o kidney problems (UTI's, kidney infections, bladder infections, and an abscess on her left kidney).  Patient would also like to have a pelvic done to check for STD's.

## 2012-03-05 ENCOUNTER — Telehealth (HOSPITAL_COMMUNITY): Payer: Self-pay | Admitting: *Deleted

## 2012-03-05 NOTE — ED Notes (Signed)
Patient called for test results and was informed that results was negative. 

## 2012-04-27 DIAGNOSIS — L259 Unspecified contact dermatitis, unspecified cause: Secondary | ICD-10-CM | POA: Insufficient documentation

## 2012-05-03 DIAGNOSIS — B029 Zoster without complications: Secondary | ICD-10-CM | POA: Insufficient documentation

## 2012-06-04 NOTE — Op Note (Unsigned)
ROOM NUMBER:                           [id number]            SURGEON:                                Sinclair Grooms, MD            DATE:                                   05/15/2005                  ASSISTANT:  Lake Bells, CRN, FA, CNM            PREOPERATIVE DIAGNOSES:  Term pregnancy, contracted pelvis, failed      induction.            POSTOPERATIVE DIAGNOSES:  Term pregnancy, contracted pelvis, failed      induction, delivered.            OPERATION:  Primary low segment transverse cesarean section with placement      of ON-Q pump.            ANESTHESIA:  Spinal.            ESTIMATED BLOOD LOSS:  800 mL.            COMPLICATIONS:  None.            SPECIMENS:  None.            FINDINGS:  Live female infant with Apgars of 9 and 9, weight of 8 pounds 4      ounces, placenta manually removed.  Also noted was a loose nuchal cord x1.      The tubes and ovaries appeared normal bilaterally.  However, there were      some adhesions of the rectosigmoid colon to the uterus posteriorly and to      the left adnexa.            PROCEDURE:  The patient was taken to the operating room and placed in the      dorsal supine position with left uterine displacement.  After an adequate      level of spinal anesthesia had been obtained, the patient was prepped and      draped in the usual sterile manner and Foley inserted into the bladder.      The adequacy of anesthesia was checked with Allis clamps on the skin and      when adequate, a knife was used to make a Pfannenstiel incision through      skin and carried sharply down to the subcutaneous tissues to the level of      the fascia.            The fascia was nicked, and the uterine incision was sharply extended      laterally using Mayo scissors under direct visualization.  The fascia was      sharply dissected off the muscle bellies anteriorly.  The muscle bellies      were sharply separated in the midline.  Abdominal peritoneum was bluntly      entered and the incision vertically  extended using Metzenbaum scissors      under direct visualization.  The bladder blade was then placed.  A bladder      flap was then created transversely across the lower uterine segment and      bluntly dissected off.  The bladder blade was replaced.            Then, using a knife, a curvilinear incision was made transversely across      the lower uterine segment.  The amniotic cavity was entered and incision      bluntly extended laterally.  The vertex of the infant was identified and      delivered through the abdominal incision.  Bulb suction was used to clear      the oropharynx.  The rest of the infant was then easily delivered after the      cord had been reduced.  The cord was then clamped and cut and the infant      passed to awaiting neonatologist.  Cord blood was obtained.            The placenta was manually removed, and the uterus was cleaned with moist      laparotomy tapes.  The uterus was then closed in a single layer using 0      Dexon suture in a running interlocking fashion to achieve good hemostasis.      The peritoneal gutters were then cleaned with moist laparotomy tapes.      Tubes and ovaries were inspected bilaterally with noted findings above.      The uterine incision was inspected one last time for hemostasis and when      adequate, the incision was closed.  The rectus muscle bellies were      reapproximated in the midline using interrupted 0 Dexon.  The fascia was      closed from the left apex in the midline using 0 Dexon suture in a running      continuous manner.            The ON-Q subfascial infusion pump was inserted superior and midline to the      incision and secured with Tegaderm dressing, Steri-Strips, and Indermil.      The right aspect of the fascial incision was then closed from the apex to      the midline to complete the fascial closure.  The subcutaneous tissue was      inspected for hemostasis and when adequate, skin edges were approximated      using 4-0 Vicryl in a  subcuticular fashion.  Sterile dressings were then      placed across the incision.  All counts were correct x2.  Clear urine was      noted in the Foley catheter.  The patient was transferred to the recovery      room in stable condition having tolerated the procedure well.                                          ______________________________     Date Signed: __________      Sinclair Grooms, MD            L N:[id number]      Dictated:    05/15/2005  2:47 P      Transcribed: 05/15/2005  3:18 P      Job Number: [id number]      Document Number: [id number]  CC:  Sinclair Grooms, MD

## 2012-06-12 ENCOUNTER — Encounter (HOSPITAL_COMMUNITY): Payer: Self-pay | Admitting: Obstetrics and Gynecology

## 2012-06-12 ENCOUNTER — Inpatient Hospital Stay (HOSPITAL_COMMUNITY): Payer: Self-pay

## 2012-06-12 ENCOUNTER — Inpatient Hospital Stay (HOSPITAL_COMMUNITY)
Admission: AD | Admit: 2012-06-12 | Discharge: 2012-06-12 | Disposition: A | Discharge status: Home / Self Care | Payer: Self-pay | Source: Ambulatory Visit | Attending: Obstetrics & Gynecology | Admitting: Obstetrics & Gynecology

## 2012-06-12 DIAGNOSIS — B9689 Other specified bacterial agents as the cause of diseases classified elsewhere: Secondary | ICD-10-CM | POA: Insufficient documentation

## 2012-06-12 DIAGNOSIS — N83209 Unspecified ovarian cyst, unspecified side: Secondary | ICD-10-CM | POA: Insufficient documentation

## 2012-06-12 DIAGNOSIS — R1903 Right lower quadrant abdominal swelling, mass and lump: Secondary | ICD-10-CM

## 2012-06-12 DIAGNOSIS — N83202 Unspecified ovarian cyst, left side: Secondary | ICD-10-CM

## 2012-06-12 DIAGNOSIS — R1032 Left lower quadrant pain: Secondary | ICD-10-CM | POA: Insufficient documentation

## 2012-06-12 DIAGNOSIS — A499 Bacterial infection, unspecified: Secondary | ICD-10-CM

## 2012-06-12 DIAGNOSIS — N760 Acute vaginitis: Secondary | ICD-10-CM | POA: Insufficient documentation

## 2012-06-12 LAB — CBC
MCV: 95.3 fL (ref 78.0–100.0)
Platelets: 155 10*3/uL (ref 150–400)
RDW: 12.9 % (ref 11.5–15.5)
WBC: 12.7 10*3/uL — ABNORMAL HIGH (ref 4.0–10.5)

## 2012-06-12 LAB — URINE MICROSCOPIC-ADD ON

## 2012-06-12 LAB — URINALYSIS, ROUTINE W REFLEX MICROSCOPIC
Bilirubin Urine: NEGATIVE
Nitrite: POSITIVE — AB
Specific Gravity, Urine: <1.005 — ABNORMAL LOW (ref 1.005–1.030)
Urobilinogen, UA: 0.2 mg/dL (ref 0.0–1.0)

## 2012-06-12 LAB — COMPREHENSIVE METABOLIC PANEL
AST: 26 U/L (ref 0–37)
Albumin: 3 g/dL — ABNORMAL LOW (ref 3.5–5.2)
Calcium: 9 mg/dL (ref 8.4–10.5)
Chloride: 103 mEq/L (ref 96–112)
Creatinine, Ser: 0.64 mg/dL (ref 0.50–1.10)
Total Bilirubin: 0.3 mg/dL (ref 0.3–1.2)

## 2012-06-12 LAB — WET PREP, GENITAL
Trich, Wet Prep: NONE SEEN
Yeast Wet Prep HPF POC: NONE SEEN

## 2012-06-12 LAB — POCT PREGNANCY, URINE: Preg Test, Ur: NEGATIVE

## 2012-06-12 MED ORDER — OXYCODONE-ACETAMINOPHEN 5-325 MG PO TABS: 1.0000 | ORAL_TABLET | ORAL | Status: DC | PRN

## 2012-06-12 MED ORDER — IBUPROFEN 600 MG PO TABS: 600.0000 mg | ORAL_TABLET | Freq: Four times a day (QID) | ORAL | Status: DC | PRN

## 2012-06-12 MED ORDER — METRONIDAZOLE 500 MG PO TABS: 500.0000 mg | ORAL_TABLET | Freq: Two times a day (BID) | ORAL | Status: AC

## 2012-06-12 MED ORDER — KETOROLAC TROMETHAMINE 30 MG/ML IJ SOLN
30.0000 mg | Freq: Four times a day (QID) | INTRAMUSCULAR | Status: DC | PRN
  Administered 2012-06-12: 30 mg via INTRAMUSCULAR
  Filled 2012-06-12: qty 1

## 2012-06-12 MED ORDER — METRONIDAZOLE 1 % EX GEL: Freq: Every day | CUTANEOUS | Status: DC

## 2012-06-12 NOTE — MAU Provider Note (Signed)
CC: Abdominal Pain    First Provider Initiated Contact with Patient 06/12/12 1410      HPI Crystal Hernandez is a 37 y.o. [phone]  who presents with abrupt onset 2 days ago of crampy left lower quadrant abdominal pain which has progressively worsened and become generalized throughout lower abdomen. Now the pain is sharp and has interfered with her sleep. LMP 06/01/2012.Marland Kitchen Denies abnormal bleeding. No contraception. Denies dysuria, urgency, frequency, N/V/D/C, no fever/chills.   PMH significant for depression  OB HX: NSVD x 3  Past Surgical History  Procedure Date  . Colposcopy   . Wisdom tooth extraction     History   Social History  . Marital Status: Divorced    Spouse Name: N/A    Number of Children: N/A  . Years of Education: N/A   Occupational History  . Not on file.   Social History Main Topics  . Smoking status: Current Every Day Smoker -- 0.5 packs/day    Types: Cigarettes  . Smokeless tobacco: Never Used  . Alcohol Use: 0.6 oz/week    1 Cans of beer per week     Comment: occasional   . Drug Use: No  . Sexually Active: Yes    Birth Control/ Protection: None   Other Topics Concern  . Not on file   Social History Narrative  . No narrative on file    No current facility-administered medications on file prior to encounter.   Current Outpatient Prescriptions on File Prior to Encounter  Medication Sig Dispense Refill  . sulfamethoxazole-trimethoprim (SEPTRA DS) 800-160 MG per tablet Take 1 tablet by mouth every 12 (twelve) hours.  14 tablet  0    Allergies  Allergen Reactions  . Morphine And Related Itching    Had severe itching and was given benadryl.     ROS Pertinent items in HPI  PHYSICAL EXAM Filed Vitals:   06/12/12 1350  BP: 107/78  Pulse: 101  Temp: 98.5 F (36.9 C)  Resp: 18   General: Well nourished, well developed female in apparent distress Cardiovascular: Normal rate Respiratory: Normal effort Abdomen: Soft except some  voluntary guarding, diminished BS, tender to  palpation (winces and cries out)  LLQ>RLQ, +rebound Back: No CVAT Extremities: No edema Neurologic: Alert and oriented Speculum exam: NEFG; vagina with physiologic discharge, no blood; cervix clean Bimanual exam: cervix closed, positive CMT; uterus tender; L>R  adnexal tenderness, no masses  LAB RESULTS Results for orders placed during the hospital encounter of 06/12/12 (from the past 24 hour(s))  URINALYSIS, ROUTINE W REFLEX MICROSCOPIC     Status: Abnormal   Collection Time   06/12/12  1:50 PM      Component Value Range   Color, Urine YELLOW  YELLOW   APPearance CLEAR  CLEAR   Specific Gravity, Urine <1.005 (*) 1.005 - 1.030   pH 6.0  5.0 - 8.0   Glucose, UA NEGATIVE  NEGATIVE mg/dL   Hgb urine dipstick TRACE (*) NEGATIVE   Bilirubin Urine NEGATIVE  NEGATIVE   Ketones, ur NEGATIVE  NEGATIVE mg/dL   Protein, ur NEGATIVE  NEGATIVE mg/dL   Urobilinogen, UA 0.2  0.0 - 1.0 mg/dL   Nitrite POSITIVE (*) NEGATIVE   Leukocytes, UA TRACE (*) NEGATIVE  URINE MICROSCOPIC-ADD ON     Status: Abnormal   Collection Time   06/12/12  1:50 PM      Component Value Range   Squamous Epithelial / LPF FEW (*) RARE   WBC, UA 0-2  <3  WBC/hpf   RBC / HPF 0-2  <3 RBC/hpf   Bacteria, UA MANY (*) RARE   Urine-Other MUCOUS PRESENT    POCT PREGNANCY, URINE     Status: Normal   Collection Time   06/12/12  2:05 PM      Component Value Range   Preg Test, Ur NEGATIVE  NEGATIVE  WET PREP, GENITAL     Status: Abnormal   Collection Time   06/12/12  2:35 PM      Component Value Range   Yeast Wet Prep HPF POC NONE SEEN  NONE SEEN   Trich, Wet Prep NONE SEEN  NONE SEEN   Clue Cells Wet Prep HPF POC MODERATE (*) NONE SEEN   WBC, Wet Prep HPF POC MANY (*) NONE SEEN  CBC     Status: Abnormal   Collection Time   06/12/12  3:33 PM      Component Value Range   WBC 12.7 (*) 4.0 - 10.5 K/uL   RBC 4.43  3.87 - 5.11 MIL/uL   Hemoglobin 14.4  12.0 - 15.0 g/dL   HCT 16.1   09.6 - 04.5 %   MCV 95.3  78.0 - 100.0 fL   MCH 32.5  26.0 - 34.0 pg   MCHC 34.1  30.0 - 36.0 g/dL   RDW 40.9  81.1 - 91.4 %   Platelets 155  150 - 400 K/uL  COMPREHENSIVE METABOLIC PANEL     Status: Abnormal   Collection Time   06/12/12  3:33 PM      Component Value Range   Sodium 135  135 - 145 mEq/L   Potassium 3.9  3.5 - 5.1 mEq/L   Chloride 103  96 - 112 mEq/L   CO2 23  19 - 32 mEq/L   Glucose, Bld 98  70 - 99 mg/dL   BUN 13  6 - 23 mg/dL   Creatinine, Ser 7.82  0.50 - 1.10 mg/dL   Calcium 9.0  8.4 - 95.6 mg/dL   Total Protein 6.5  6.0 - 8.3 g/dL   Albumin 3.0 (*) 3.5 - 5.2 g/dL   AST 26  0 - 37 U/L   ALT 37 (*) 0 - 35 U/L   Alkaline Phosphatase 90  39 - 117 U/L   Total Bilirubin 0.3  0.3 - 1.2 mg/dL   GFR calc non Af Amer >90  >90 mL/min   GFR calc Af Amer >90  >90 mL/min    IMAGING  Clinical Data: Left lower quadrant pain  TRANSABDOMINAL AND TRANSVAGINAL ULTRASOUND OF PELVIS  Technique: Both transabdominal and transvaginal ultrasound  examinations of the pelvis were performed. Transabdominal  technique was performed for global imaging of the pelvis including  uterus, ovaries, adnexal regions, and pelvic cul-de-sac.  It was necessary to proceed with endovaginal exam following the  transabdominal exam to visualize the ovaries.  Comparison: None.  Findings:  Uterus: The uterus measures 8.4 x 4.5 x 5.8 cm. None normal  appearance. No mass  Endometrium: Measures 9.7 mm in thickness. A trace fluid  identified within the endometrial cavity.  Right ovary: Measures 3.1 x 2.1 x 2.2 cm. Indeterminant  heterogeneous mass within the right adnexa is identified superior  and medial to the right ovary. This measures 4.9 x 2.6 x 3.3 cm.  Left ovary: The left ovary contains a resolving corpus luteal cyst.  The left ovary measures 3.7 x 2.6 x 3.0 cm.  Other Findings: No free fluid  IMPRESSION:  1. Indeterminant complex  mass adjacent to the right ovary within  the right adnexa.  Suggest further evaluation with contrast  enhanced CT of the pelvis.  2. Nonspecific fluid within the endometrial cavity.  Original Report Authenticated By: Signa Kell, M.D.         MAU COURSE Toradol 30mg  IM given with relief Urine culture sent  ASSESSMENT  1. BV (bacterial vaginosis)   2. Left ovarian cyst   3. Abdominal or pelvic swelling, mass, or lump, right lower quadrant     PLAN Discharge home. See AVS for patient education. Alternate acetaminophen with ibuprofen if needed and use Percocet only for breakthrough pain   Medication List     As of 06/12/2012  7:09 PM    TAKE these medications         ibuprofen 600 MG tablet   Commonly known as: ADVIL,MOTRIN   Take 1 tablet (600 mg total) by mouth every 6 (six) hours as needed for pain.      metroNIDAZOLE 500 MG tablet   Commonly known as: FLAGYL   Take 1 tablet (500 mg total) by mouth 2 (two) times daily.      oxyCODONE-acetaminophen 5-325 MG per tablet   Commonly known as: PERCOCET/ROXICET   Take 1 tablet by mouth every 4 (four) hours as needed for pain.           Danae Orleans, CNM 06/12/2012 2:10 PM

## 2012-06-12 NOTE — MAU Note (Signed)
Pt presents to MAU with chief complaint of abdominal pain. Pt says the pain started Thursday and it has progressively gotten worse. The pain is bilateral lower abdomen, sharp, constant. She says the pain started on her left but now she feels it all over her lower abdomen. Pt took 600 mg of ibuprofen at 10:00 am with NO relief.

## 2012-06-12 NOTE — MAU Note (Signed)
Pt reports having LLQ pain since Thursday. Pain is sharp and crampy. Taking ibuprofen without relief. LMP 12/31. No N/V reported

## 2012-06-13 NOTE — MAU Provider Note (Signed)
Attestation of Attending Supervision of Advanced Practitioner (PA/CNM/NP): Evaluation and management procedures were performed by the Advanced Practitioner under my supervision and collaboration.  I have reviewed the Advanced Practitioner's note and chart, and I agree with the management and plan. She will follow up in GYN clinic for further evaluation of her pelvic mass.  Jaynie Collins, MD, FACOG Attending Obstetrician & Gynecologist Faculty Practice, [facility] [place]

## 2012-06-16 ENCOUNTER — Telehealth: Payer: Self-pay | Admitting: *Deleted

## 2012-06-16 DIAGNOSIS — N838 Other noninflammatory disorders of ovary, fallopian tube and broad ligament: Secondary | ICD-10-CM

## 2012-06-16 NOTE — Telephone Encounter (Addendum)
Order for CT placed in Epic, need to schedule CT, And schedule follow up gyn appt and call patient  Message copied by Gerome Apley on Wed Jun 16, 2012 11:32 AM ------      Message from: Danae Orleans      Created: Sat Jun 12, 2012  3:36 PM       Schedule outpt. CT as recommended by [place] and reviewed with Dr. Macon Large, then schedule F/U at GYN clinic

## 2012-06-16 NOTE — Telephone Encounter (Signed)
Called and scheduled CT appointment and called patient and gave her appointment . Also confirmed she is not allergic to dyes/contrast and is not diabetic or hypertensive.  Also informed her she needs a gyn appointment after that to review results and plan of care.  Patient requests appointment on same day as CT (06/22/12 0915) if possible because of work. Will send to front office to call her and make appointment. Patient also states will take any MD if Anyanwu not available.

## 2012-06-22 ENCOUNTER — Ambulatory Visit (HOSPITAL_COMMUNITY)
Admission: RE | Admit: 2012-06-22 | Discharge: 2012-06-22 | Disposition: A | Discharge status: Home / Self Care | Payer: Self-pay | Source: Ambulatory Visit | Attending: Obstetrics and Gynecology | Admitting: Obstetrics and Gynecology

## 2012-06-22 ENCOUNTER — Telehealth: Payer: Self-pay | Admitting: *Deleted

## 2012-06-22 ENCOUNTER — Encounter (HOSPITAL_COMMUNITY): Payer: Self-pay

## 2012-06-22 DIAGNOSIS — N9489 Other specified conditions associated with female genital organs and menstrual cycle: Secondary | ICD-10-CM | POA: Insufficient documentation

## 2012-06-22 DIAGNOSIS — N838 Other noninflammatory disorders of ovary, fallopian tube and broad ligament: Secondary | ICD-10-CM | POA: Insufficient documentation

## 2012-06-22 MED ORDER — IOHEXOL 300 MG/ML  SOLN: 50.0000 mL | INTRAMUSCULAR | Status: AC

## 2012-06-22 MED ORDER — IOHEXOL 300 MG/ML  SOLN
100.0000 mL | Freq: Once | INTRAMUSCULAR | Status: AC | PRN
  Administered 2012-06-22: 100 mL via INTRAVENOUS

## 2012-06-22 NOTE — Telephone Encounter (Signed)
Message copied by [name] on Tue Jun 22, 2012  3:55 PM ------      Message from: Crystal Hernandez      Created: Tue Jun 22, 2012  3:42 PM       Please schedule F/U US per radiology recommendation

## 2012-06-22 NOTE — Telephone Encounter (Signed)
Called pt and informed her of CT results from today and radiology recommendations to have follow up pelvic US in 6-12 wks. Pt would like to think about it and would like a call back in a couple days. She will let us know at that time if she wants the pelvic US scheduled.  **Note: if pt declines follow up US, Deirdre Poe CNM needs to be notified.

## 2012-06-24 ENCOUNTER — Encounter: Payer: Self-pay | Admitting: Obstetrics & Gynecology

## 2012-06-29 NOTE — Telephone Encounter (Signed)
Called pt and left message that I am following up from our conversation last week to see if she wants to have the [place] scheduled. Please call back to the nurse voice mail and indicate her preference.

## 2012-07-05 NOTE — Telephone Encounter (Signed)
Called Crystal Hernandez and left  A message stating we are calling back to follow up and see if you have made a decision regarding our previous discussion- please call clinic and leave [place] a message with your decision or if you need a call back.

## 2012-07-08 ENCOUNTER — Ambulatory Visit: Payer: Self-pay | Admitting: Obstetrics & Gynecology

## 2012-07-08 NOTE — Telephone Encounter (Signed)
Pt has appt scheduled 07/09/11 @ 115 pm. Will ask pt at this appt concerning scheduling a f/u US

## 2012-07-17 ENCOUNTER — Other Ambulatory Visit: Payer: Self-pay

## 2012-09-07 DIAGNOSIS — M545 Low back pain, unspecified: Secondary | ICD-10-CM | POA: Insufficient documentation

## 2012-09-24 DIAGNOSIS — J301 Allergic rhinitis due to pollen: Secondary | ICD-10-CM | POA: Insufficient documentation

## 2012-10-29 ENCOUNTER — Encounter (HOSPITAL_COMMUNITY): Payer: Self-pay | Admitting: Emergency Medicine

## 2012-10-29 ENCOUNTER — Emergency Department (HOSPITAL_COMMUNITY)
Admission: EM | Admit: 2012-10-29 | Discharge: 2012-10-29 | Disposition: A | Discharge status: Home / Self Care | Payer: Self-pay | Attending: Emergency Medicine | Admitting: Emergency Medicine

## 2012-10-29 DIAGNOSIS — N6459 Other signs and symptoms in breast: Secondary | ICD-10-CM | POA: Insufficient documentation

## 2012-10-29 DIAGNOSIS — Z8659 Personal history of other mental and behavioral disorders: Secondary | ICD-10-CM | POA: Insufficient documentation

## 2012-10-29 DIAGNOSIS — N6452 Nipple discharge: Secondary | ICD-10-CM

## 2012-10-29 DIAGNOSIS — F17200 Nicotine dependence, unspecified, uncomplicated: Secondary | ICD-10-CM | POA: Insufficient documentation

## 2012-10-29 DIAGNOSIS — R109 Unspecified abdominal pain: Secondary | ICD-10-CM | POA: Insufficient documentation

## 2012-10-29 DIAGNOSIS — Z9851 Tubal ligation status: Secondary | ICD-10-CM | POA: Insufficient documentation

## 2012-10-29 DIAGNOSIS — Z9889 Other specified postprocedural states: Secondary | ICD-10-CM | POA: Insufficient documentation

## 2012-10-29 DIAGNOSIS — Z87448 Personal history of other diseases of urinary system: Secondary | ICD-10-CM | POA: Insufficient documentation

## 2012-10-29 DIAGNOSIS — Z3202 Encounter for pregnancy test, result negative: Secondary | ICD-10-CM | POA: Insufficient documentation

## 2012-10-29 LAB — POCT I-STAT, CHEM 8
BUN: 8 mg/dL (ref 6–23)
Chloride: 110 mEq/L (ref 96–112)
Creatinine, Ser: 0.6 mg/dL (ref 0.50–1.10)
Glucose, Bld: 84 mg/dL (ref 70–99)
Potassium: 3.8 mEq/L (ref 3.5–5.1)

## 2012-10-29 LAB — URINALYSIS, ROUTINE W REFLEX MICROSCOPIC
Glucose, UA: NEGATIVE mg/dL
Hgb urine dipstick: NEGATIVE
Specific Gravity, Urine: 1.01 (ref 1.005–1.030)

## 2012-10-29 MED ORDER — NAPROXEN 500 MG PO TABS: 500.0000 mg | ORAL_TABLET | Freq: Two times a day (BID) | ORAL | Status: DC

## 2012-10-29 MED ORDER — DIAZEPAM 5 MG PO TABS
5.0000 mg | ORAL_TABLET | Freq: Once | ORAL | Status: AC
Start: 2012-10-29 — End: 2012-10-29
  Administered 2012-10-29: 5 mg via ORAL
  Filled 2012-10-29: qty 1

## 2012-10-29 MED ORDER — OXYCODONE-ACETAMINOPHEN 5-325 MG PO TABS
1.0000 | ORAL_TABLET | Freq: Once | ORAL | Status: AC
  Administered 2012-10-29: 1 via ORAL
  Filled 2012-10-29: qty 1

## 2012-10-29 MED ORDER — HYDROCODONE-ACETAMINOPHEN 5-325 MG PO TABS: 1.0000 | ORAL_TABLET | Freq: Four times a day (QID) | ORAL | Status: DC | PRN

## 2012-10-29 MED ORDER — CYCLOBENZAPRINE HCL 10 MG PO TABS: 10.0000 mg | ORAL_TABLET | Freq: Three times a day (TID) | ORAL | Status: DC | PRN

## 2012-10-29 NOTE — ED Provider Notes (Signed)
Medical screening examination/treatment/procedure(s) were performed by non-physician practitioner and as supervising physician I was immediately available for consultation/collaboration.   Richardean Canal, MD 10/29/12 [phone]

## 2012-10-29 NOTE — Progress Notes (Signed)
[facility] has seen patient and provided her with primary care resources.

## 2012-10-29 NOTE — ED Provider Notes (Signed)
3:56 PM Pt signed out to me by PA Paz at shift change. Pt pending lab work and UA. Pt here with right flank pain.   Results for orders placed during the hospital encounter of 10/29/12  URINALYSIS, ROUTINE W REFLEX MICROSCOPIC      Result Value Range   Color, Urine YELLOW  YELLOW   APPearance CLEAR  CLEAR   Specific Gravity, Urine 1.010  1.005 - 1.030   pH 6.0  5.0 - 8.0   Glucose, UA NEGATIVE  NEGATIVE mg/dL   Hgb urine dipstick NEGATIVE  NEGATIVE   Bilirubin Urine NEGATIVE  NEGATIVE   Ketones, ur NEGATIVE  NEGATIVE mg/dL   Protein, ur NEGATIVE  NEGATIVE mg/dL   Urobilinogen, UA 0.2  0.0 - 1.0 mg/dL   Nitrite NEGATIVE  NEGATIVE   Leukocytes, UA NEGATIVE  NEGATIVE  POCT PREGNANCY, URINE      Result Value Range   Preg Test, Ur NEGATIVE  NEGATIVE  POCT I-STAT TROPONIN I      Result Value Range   Troponin i, poc 0.00  0.00 - 0.08 ng/mL   Comment 3           POCT I-STAT, CHEM 8      Result Value Range   Sodium 141  135 - 145 mEq/L   Potassium 3.8  3.5 - 5.1 mEq/L   Chloride 110  96 - 112 mEq/L   BUN 8  6 - 23 mg/dL   Creatinine, Ser 6.57  0.50 - 1.10 mg/dL   Glucose, Bld 84  70 - 99 mg/dL   Calcium, Ion 8.46  1.12 - 1.23 mmol/L   TCO2 21  0 - 100 mmol/L   Hemoglobin 15.6 (*) 12.0 - 15.0 g/dL   HCT 96.2  95.2 - 84.1 %   No results found.  Pt continues to have pain in right flank. States same pain as when she was diagnosed with pyelonephritis. Pt's UA is clear. IStat chem 8 normal. Pt not pregnant. Pt is afebrile. PERC negative, doubt PE. Pt does have tenderness with palpation in right lower back and right SI joint. Pain with ROM of right hip. Suspect pain most likely musculoskeletal. Will start on naprosyn, norco, flexeril. Follow up with pcp.   Filed Vitals:   10/29/12 1435  BP: 116/79  Pulse: 63  Temp: 98.3 F (36.8 C)  Resp: 16     Jameeka Marcy A Jurnie Garritano, PA-C 10/29/12 1559

## 2012-10-29 NOTE — ED Provider Notes (Signed)
History     CSN: [id number]  Arrival date & time 10/29/12  1327   First MD Initiated Contact with Patient 10/29/12 1412      No chief complaint on file.   (Consider location/radiation/quality/duration/timing/severity/associated sxs/prior treatment) HPI Crystal Hernandez is a 37 y.o. female to the emergency department complaining of right flank pain.  Onset of symptoms began a couple days ago, rated at 6/10 in severity and described as sharp and constant.  Symptoms have been gradually worsening.  Patient has no history of previous back problems and denies any trauma, injury, or excessive working out.  Patient states she's had back pain like this once before and it was associated with a kidney infection.  Patient denies any dysuria, hematuria or vaginal discharge.  Pain is worsened by palpation and certain movements.  In addition patient reports a bilateral creamy or clear breast discharge.  Patient denies any history of malignancy and is not currently on hormone replacement therapy.  No other complaints at this time.Patient's last menstrual period was 10/10/2012.   Past Medical History  Diagnosis Date  . 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 Depression   . Seasonal allergies   . Abnormal Pap smear     Past Surgical History  Procedure Laterality Date  . Colposcopy    . Wisdom tooth extraction    . Tubal ligation      Family History  Problem Relation Age of Onset  . Anesthesia problems Neg Hx   . Hypotension Neg Hx   . Malignant hyperthermia Neg Hx   . Pseudochol deficiency Neg Hx     History  Substance Use Topics  . Smoking status: Current Every Day Smoker -- 0.50 packs/day    Types: Cigarettes  . Smokeless tobacco: Never Used  . Alcohol Use: 0.6 oz/week    1 Cans of beer per week     Comment: occasional     OB History   Grav Para Term Preterm Abortions TAB SAB Ect Mult Living   3 3 3       3       Review of Systems Ten systems  reviewed and are negative for acute change, except as noted in the HPI.   Allergies  Morphine and related  Home Medications   Current Outpatient Rx  Name  Route  Sig  Dispense  Refill  . ibuprofen (ADVIL,MOTRIN) 600 MG tablet   Oral   Take 1,200 mg by mouth every 6 (six) hours as needed for pain.           BP 116/79  Pulse 63  Temp(Src) 98.3 F (36.8 C) (Oral)  Resp 16  SpO2 100%  LMP 10/10/2012  Physical Exam  Nursing note and vitals reviewed. Constitutional: She is oriented to person, place, and time. She appears well-developed and well-nourished. No distress.  HENT:  Head: Normocephalic and atraumatic.  Eyes: Conjunctivae and EOM are normal.  Neck: Normal range of motion.  Cardiovascular:  Regular rate rhythm  Pulmonary/Chest: Effort normal.  Bilateral breast palpated with no palpable masses or nipple discharge.  Lungs clear to auscultation bilaterally  Abdominal:  No suprapubic abdominal tenderness.   Genitourinary:  Right CVA tenderness  Musculoskeletal: Normal range of motion.  Pain with right hip flexion and extension.  Tenderness to palpation over the SI region.  No pain with internal or external rotation of hip.  Other extremities are full normal range of motion  Neurological: She  is alert and oriented to person, place, and time.  Strength 5/5 bilaterally.  Intact distal sensation.  Normal ambulation.  Skin: Skin is warm and dry. No rash noted. She is not diaphoretic.  Psychiatric: She has a normal mood and affect. Her behavior is normal.    ED Course  Procedures (including critical care time)  Labs Reviewed  URINALYSIS, ROUTINE W REFLEX MICROSCOPIC  POCT PREGNANCY, URINE   No results found.   No diagnosis found.  BP 116/79  Pulse 63  Temp(Src) 98.3 F (36.8 C) (Oral)  Resp 16  SpO2 100%  LMP 10/10/2012   MDM  Right flank pain and bilateral nipple discharge Care resumed by oncoming provider Tatyana Kirichenko.  Disposition pending lab  results.  Advised referral to breast Center for further evaluation of nipple discharge.  No palpable masses here on exam but did discuss low possibility of malignancy.  Patient verbalizes understanding and nose important of followup.  In regards to back pain patient is without focal neuro deficits on exam.  Possible etiology of urinary tract infection as patient had previous nephritis that presented similarly.  Possible images to be ordered pending results of urine.  I-STAT also ordered to evaluate kidney function.  Patient appears stable with pain managed in the ED prior to hand off.        Jaci Carrel, [place] 10/29/12 1520

## 2012-10-30 NOTE — ED Provider Notes (Signed)
Medical screening examination/treatment/procedure(s) were performed by non-physician practitioner and as supervising physician I was immediately available for consultation/collaboration.    Keaton Beichner D Ziva Nunziata, MD 10/30/12 0812 

## 2012-11-16 DIAGNOSIS — E063 Autoimmune thyroiditis: Secondary | ICD-10-CM | POA: Insufficient documentation

## 2012-11-16 DIAGNOSIS — D485 Neoplasm of uncertain behavior of skin: Secondary | ICD-10-CM | POA: Insufficient documentation

## 2013-01-11 DIAGNOSIS — D473 Essential (hemorrhagic) thrombocythemia: Secondary | ICD-10-CM | POA: Insufficient documentation

## 2013-01-11 DIAGNOSIS — L290 Pruritus ani: Secondary | ICD-10-CM | POA: Insufficient documentation

## 2013-01-14 ENCOUNTER — Encounter (HOSPITAL_COMMUNITY): Payer: Self-pay | Admitting: *Deleted

## 2013-01-14 ENCOUNTER — Inpatient Hospital Stay (HOSPITAL_COMMUNITY)
Admission: AD | Admit: 2013-01-14 | Discharge: 2013-01-14 | Disposition: A | Discharge status: Home / Self Care | Payer: Self-pay | Source: Ambulatory Visit | Attending: Obstetrics & Gynecology | Admitting: Obstetrics & Gynecology

## 2013-01-14 DIAGNOSIS — B373 Candidiasis of vulva and vagina: Secondary | ICD-10-CM

## 2013-01-14 DIAGNOSIS — R300 Dysuria: Secondary | ICD-10-CM | POA: Insufficient documentation

## 2013-01-14 DIAGNOSIS — B3731 Acute candidiasis of vulva and vagina: Secondary | ICD-10-CM

## 2013-01-14 DIAGNOSIS — N949 Unspecified condition associated with female genital organs and menstrual cycle: Secondary | ICD-10-CM | POA: Insufficient documentation

## 2013-01-14 DIAGNOSIS — Z202 Contact with and (suspected) exposure to infections with a predominantly sexual mode of transmission: Secondary | ICD-10-CM

## 2013-01-14 HISTORY — DX: Unspecified infectious disease: B99.9

## 2013-01-14 HISTORY — DX: Renal and perinephric abscess: N15.1

## 2013-01-14 LAB — URINALYSIS, ROUTINE W REFLEX MICROSCOPIC
Bilirubin Urine: NEGATIVE
Hgb urine dipstick: NEGATIVE
Specific Gravity, Urine: <1.005 — ABNORMAL LOW (ref 1.005–1.030)
pH: 6.5 (ref 5.0–8.0)

## 2013-01-14 LAB — WET PREP, GENITAL: Trich, Wet Prep: NONE SEEN

## 2013-01-14 MED ORDER — AZITHROMYCIN 250 MG PO TABS
1000.0000 mg | ORAL_TABLET | ORAL | Status: AC
  Administered 2013-01-14: 1000 mg via ORAL
  Filled 2013-01-14: qty 4

## 2013-01-14 MED ORDER — CEFTRIAXONE SODIUM 250 MG IJ SOLR
250.0000 mg | INTRAMUSCULAR | Status: AC
  Administered 2013-01-14: 250 mg via INTRAMUSCULAR
  Filled 2013-01-14: qty 250
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
## 2013-01-14 NOTE — MAU Note (Addendum)
Started having d/c the other night, painful to have intercourse.  Burns with urination. Swelling noted around rt eye; eye is red, reports sensitivity to light, itches no drainage

## 2013-01-14 NOTE — MAU Provider Note (Signed)
Attestation of Attending Supervision of Advanced Practitioner (CNM/NP): Evaluation and management procedures were performed by the Advanced Practitioner under my supervision and collaboration.  I have reviewed the Advanced Practitioner's note and chart, and I agree with the management and plan.  HARRAWAY-SMITH, Isaias Dowson 10:15 PM     

## 2013-01-15 LAB — GC/CHLAMYDIA PROBE AMP
CT Probe RNA: NEGATIVE
GC Probe RNA: NEGATIVE

## 2013-04-01 ENCOUNTER — Encounter (HOSPITAL_COMMUNITY): Payer: Self-pay | Admitting: *Deleted

## 2013-04-01 ENCOUNTER — Inpatient Hospital Stay (HOSPITAL_COMMUNITY)
Admission: AD | Admit: 2013-04-01 | Discharge: 2013-04-01 | Disposition: A | Discharge status: Home / Self Care | Payer: Self-pay | Source: Ambulatory Visit | Attending: Obstetrics & Gynecology | Admitting: Obstetrics & Gynecology

## 2013-04-01 DIAGNOSIS — A499 Bacterial infection, unspecified: Secondary | ICD-10-CM | POA: Insufficient documentation

## 2013-04-01 DIAGNOSIS — B9689 Other specified bacterial agents as the cause of diseases classified elsewhere: Secondary | ICD-10-CM | POA: Insufficient documentation

## 2013-04-01 DIAGNOSIS — N390 Urinary tract infection, site not specified: Secondary | ICD-10-CM | POA: Insufficient documentation

## 2013-04-01 DIAGNOSIS — N760 Acute vaginitis: Secondary | ICD-10-CM | POA: Insufficient documentation

## 2013-04-01 DIAGNOSIS — R109 Unspecified abdominal pain: Secondary | ICD-10-CM | POA: Insufficient documentation

## 2013-04-01 LAB — WET PREP, GENITAL: Yeast Wet Prep HPF POC: NONE SEEN

## 2013-04-01 LAB — CBC WITH DIFFERENTIAL/PLATELET
Eosinophils Absolute: 0.1 10*3/uL (ref 0.0–0.7)
Eosinophils Relative: 2 % (ref 0–5)
HCT: 40.3 % (ref 36.0–46.0)
Lymphocytes Relative: 34 % (ref 12–46)
Lymphs Abs: 2.9 10*3/uL (ref 0.7–4.0)
MCH: 31.1 pg (ref 26.0–34.0)
MCV: 93.5 fL (ref 78.0–100.0)
Monocytes Absolute: 0.6 10*3/uL (ref 0.1–1.0)
Monocytes Relative: 7 % (ref 3–12)
Platelets: 162 10*3/uL (ref 150–400)
RBC: 4.31 MIL/uL (ref 3.87–5.11)
WBC: 8.7 10*3/uL (ref 4.0–10.5)

## 2013-04-01 LAB — URINALYSIS, ROUTINE W REFLEX MICROSCOPIC
Glucose, UA: NEGATIVE mg/dL
Ketones, ur: NEGATIVE mg/dL
Protein, ur: NEGATIVE mg/dL
pH: 6.5 (ref 5.0–8.0)

## 2013-04-01 LAB — URINE MICROSCOPIC-ADD ON

## 2013-04-01 MED ORDER — NITROFURANTOIN MONOHYD MACRO 100 MG PO CAPS: 100.0000 mg | ORAL_CAPSULE | Freq: Two times a day (BID) | ORAL | Status: DC

## 2013-04-01 MED ORDER — METRONIDAZOLE 500 MG PO TABS: 500.0000 mg | ORAL_TABLET | Freq: Two times a day (BID) | ORAL | Status: DC

## 2013-04-01 NOTE — MAU Note (Signed)
Started Wed morning, thought it was just her period.  Has been really heavy, changed pad 6 times at work today.  Cramping in lower abd down into upper thighs through to back.

## 2013-04-01 NOTE — MAU Note (Signed)
Dizzy and light headed while at work today.

## 2013-04-01 NOTE — MAU Provider Note (Signed)
History     CSN: [id number]  Arrival date and time: 04/01/13 1755   None     Chief Complaint  Patient presents with  . Vaginal Bleeding  . Abdominal Pain   HPI Crystal Hernandez is 37 y.o. [phone] presents with lower abdominal pain X 2 days.  She is also having heavy vaginal bleeding---changed pad X 6 in 10 hrs.  Her menstrual cycle began 2 days ago. Expected timing of cycle.   Has monthly cycles X  3-4 days.  Ibuprofen 600mg  bid has relieved pain a little. Last taken at noon.   Hx of ovarian cysts 06/2012. Did not follow up in the clinic.   BTL for contraception.  Sexually active X 3 years.  She is concerned about STI's.     Past Medical History  Diagnosis Date  . 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 Depression   . Seasonal allergies   . Abnormal Pap smear   . Infection     UTI  . Kidney abscess     Past Surgical History  Procedure Laterality Date  . Colposcopy    . Wisdom tooth extraction    . Tubal ligation    . Anterior cruciate ligament repair      rt  . Urethral stint      Family History  Problem Relation Age of Onset  . Anesthesia problems Neg Hx   . Hypotension Neg Hx   . Malignant hyperthermia Neg Hx   . Pseudochol deficiency Neg Hx   . Cancer Father     2000    History  Substance Use Topics  . Smoking status: Current Every Day Smoker -- 0.50 packs/day for 26 years    Types: Cigarettes  . Smokeless tobacco: Never Used  . Alcohol Use: 0.0 oz/week     Comment: occasional     Allergies:  Allergies  Allergen Reactions  . Morphine And Related Itching    Had severe itching and was given benadryl.     Prescriptions prior to admission  Medication Sig Dispense Refill  . Pseudoephedrine-APAP-DM (DAYQUIL PO) Take 1 capsule by mouth as needed. cough        Review of Systems  Constitutional: Negative for fever and chills.  Gastrointestinal: Positive for abdominal pain (lower abdominal --mid). Negative for nausea,  vomiting, diarrhea and constipation.  Genitourinary: Positive for urgency and frequency. Negative for dysuria.       + for vaginal bleeding--menstrual cycle  Neurological: Negative for headaches.   Physical Exam   Blood pressure 176/81, pulse 63, temperature 97.6 F (36.4 C), temperature source Oral, resp. rate 20, height 5\' 3"  (1.6 m), weight 156 lb (70.761 kg), last menstrual period 03/30/2013, SpO2 100.00%.  Physical Exam  Constitutional: She is oriented to person, place, and time. She appears well-developed and well-nourished. No distress.  HENT:  Head: Normocephalic.  Neck: Normal range of motion.  Cardiovascular: Normal rate.   Respiratory: Effort normal.  GI: Soft. She exhibits no distension and no mass. There is tenderness (suprapubic tenderness). There is no rebound and no guarding.  Genitourinary: There is no rash, tenderness or lesion on the right labia. There is no rash, tenderness or lesion on the left labia. Uterus is not enlarged and not tender. Cervix exhibits no motion tenderness, no discharge and no friability. Right adnexum displays no mass, no tenderness and no fullness. Left adnexum displays no mass, no tenderness and no fullness. There is bleeding  around the vagina. No tenderness around the vagina. Foreign body: mod amount of bright red bleeding.  Neg for clots.  Neg for foul odor. No vaginal discharge found.  Neurological: She is alert and oriented to person, place, and time.  Skin: Skin is warm and dry.  Psychiatric: She has a normal mood and affect. Her behavior is normal.   Results for orders placed during the hospital encounter of 04/01/13 (from the past 24 hour(s))  URINALYSIS, ROUTINE W REFLEX MICROSCOPIC     Status: Abnormal   Collection Time    04/01/13  6:10 PM      Result Value Range   Color, Urine YELLOW  YELLOW   APPearance CLEAR  CLEAR   Specific Gravity, Urine 1.015  1.005 - 1.030   pH 6.5  5.0 - 8.0   Glucose, UA NEGATIVE  NEGATIVE mg/dL   Hgb  urine dipstick SMALL (*) NEGATIVE   Bilirubin Urine NEGATIVE  NEGATIVE   Ketones, ur NEGATIVE  NEGATIVE mg/dL   Protein, ur NEGATIVE  NEGATIVE mg/dL   Urobilinogen, UA 0.2  0.0 - 1.0 mg/dL   Nitrite POSITIVE (*) NEGATIVE   Leukocytes, UA NEGATIVE  NEGATIVE  URINE MICROSCOPIC-ADD ON     Status: Abnormal   Collection Time    04/01/13  6:10 PM      Result Value Range   Squamous Epithelial / LPF RARE  RARE   WBC, UA 0-2  <3 WBC/hpf   RBC / HPF 0-2  <3 RBC/hpf   Bacteria, UA MANY (*) RARE  POCT PREGNANCY, URINE     Status: None   Collection Time    04/01/13  6:27 PM      Result Value Range   Preg Test, Ur NEGATIVE  NEGATIVE  WET PREP, GENITAL     Status: Abnormal   Collection Time    04/01/13  7:27 PM      Result Value Range   Yeast Wet Prep HPF POC NONE SEEN  NONE SEEN   Trich, Wet Prep NONE SEEN  NONE SEEN   Clue Cells Wet Prep HPF POC MODERATE (*) NONE SEEN   WBC, Wet Prep HPF POC MODERATE (*) NONE SEEN  CBC WITH DIFFERENTIAL     Status: None   Collection Time    04/01/13  7:57 PM      Result Value Range   WBC 8.7  4.0 - 10.5 K/uL   RBC 4.31  3.87 - 5.11 MIL/uL   Hemoglobin 13.4  12.0 - 15.0 g/dL   HCT 16.1  09.6 - 04.5 %   MCV 93.5  78.0 - 100.0 fL   MCH 31.1  26.0 - 34.0 pg   MCHC 33.3  30.0 - 36.0 g/dL   RDW 40.9  81.1 - 91.4 %   Platelets 162  150 - 400 K/uL   Neutrophils Relative % 58  43 - 77 %   Neutro Abs 5.0  1.7 - 7.7 K/uL   Lymphocytes Relative 34  12 - 46 %   Lymphs Abs 2.9  0.7 - 4.0 K/uL   Monocytes Relative 7  3 - 12 %   Monocytes Absolute 0.6  0.1 - 1.0 K/uL   Eosinophils Relative 2  0 - 5 %   Eosinophils Absolute 0.1  0.0 - 0.7 K/uL   Basophils Relative 0  0 - 1 %   Basophils Absolute 0.0  0.0 - 0.1 K/uL   MAU Course  Procedures GC/CHL culture to lab  MDM  Offered toradol for pain,  Patient declined.   Care turned over to L.Sirena Riddle, NP  Assessment and Plan  A:  Suprapubic pain      UTI     Vaginal bleeding-menses      Bacterial  vaginosis  P:  Rx for Macrobid X 1 week      Flagyl 500 mg BID X 7 days      No alcohol or intercourse for 7 days      Follow up with MD of choice if sxs persist.  Carolynn Serve 04/01/2013, 8:36 PM

## 2013-04-02 NOTE — MAU Provider Note (Signed)
Attestation of Attending Supervision of Advanced Practitioner (CNM/NP): Evaluation and management procedures were performed by the Advanced Practitioner under my supervision and collaboration.  I have reviewed the Advanced Practitioner's note and chart, and I agree with the management and plan.  HARRAWAY-SMITH, Danetra Glock 6:47 AM     

## 2013-04-07 ENCOUNTER — Other Ambulatory Visit: Payer: Self-pay

## 2013-05-09 ENCOUNTER — Emergency Department (HOSPITAL_COMMUNITY)
Admission: EM | Admit: 2013-05-09 | Discharge: 2013-05-09 | Disposition: A | Discharge status: Home / Self Care | Payer: Self-pay | Attending: Emergency Medicine | Admitting: Emergency Medicine

## 2013-05-09 ENCOUNTER — Encounter (HOSPITAL_COMMUNITY): Payer: Self-pay | Admitting: Emergency Medicine

## 2013-05-09 DIAGNOSIS — R05 Cough: Secondary | ICD-10-CM | POA: Insufficient documentation

## 2013-05-09 DIAGNOSIS — Z87448 Personal history of other diseases of urinary system: Secondary | ICD-10-CM | POA: Insufficient documentation

## 2013-05-09 DIAGNOSIS — Z8619 Personal history of other infectious and parasitic diseases: Secondary | ICD-10-CM | POA: Insufficient documentation

## 2013-05-09 DIAGNOSIS — H109 Unspecified conjunctivitis: Secondary | ICD-10-CM | POA: Insufficient documentation

## 2013-05-09 DIAGNOSIS — J329 Chronic sinusitis, unspecified: Secondary | ICD-10-CM | POA: Insufficient documentation

## 2013-05-09 DIAGNOSIS — Z8659 Personal history of other mental and behavioral disorders: Secondary | ICD-10-CM | POA: Insufficient documentation

## 2013-05-09 DIAGNOSIS — R059 Cough, unspecified: Secondary | ICD-10-CM | POA: Insufficient documentation

## 2013-05-09 DIAGNOSIS — Z9109 Other allergy status, other than to drugs and biological substances: Secondary | ICD-10-CM | POA: Insufficient documentation

## 2013-05-09 DIAGNOSIS — Z87440 Personal history of urinary (tract) infections: Secondary | ICD-10-CM | POA: Insufficient documentation

## 2013-05-09 DIAGNOSIS — F17200 Nicotine dependence, unspecified, uncomplicated: Secondary | ICD-10-CM | POA: Insufficient documentation

## 2013-05-09 MED ORDER — TOBRAMYCIN-DEXAMETHASONE 0.3-0.1 % OP SUSP
1.0000 [drp] | OPHTHALMIC | Status: DC
  Administered 2013-05-09: 1 [drp] via OPHTHALMIC
  Filled 2013-05-09: qty 2.5

## 2013-05-09 MED ORDER — CEPHALEXIN 500 MG PO CAPS: 500.0000 mg | ORAL_CAPSULE | Freq: Four times a day (QID) | ORAL | Status: DC

## 2013-05-09 NOTE — ED Notes (Signed)
Patient c/o frontal headache, sinus congestion and redness of the left eye with yellow drainage. Patient states she woke this AM with eyelashes matted with yellow ddrainage and the left eye has redness. patient reports taking several types of sinus medication over the past 2 weeks with no relief.

## 2013-05-09 NOTE — ED Provider Notes (Signed)
CSN: [id number]     Arrival date & time 05/09/13  1317 History  This chart was scribed for Crystal Distance, PA working with Derwood Kaplan, MD by Quintella Reichert, ED Scribe. This patient was seen in room WTR6/WTR6 and the patient's care was started at 2:58 PM.   Chief Complaint  Patient presents with  . Headache  . Eye Drainage    The history is provided by the patient. No language interpreter was used.    HPI Comments: Crystal Hernandez is a 37 y.o. female with h/o seasonal allergies who presents to the Emergency Department complaining of 1 1/2 weeks of persistent, worsening headache, with associated sinus pressure, nasal congestion, cough, and rhinorrhea.  Mucus is clear or yellowish.  Pt has attempted to treat symptoms with multiple OTC sinus medications, with no relief.  She states she also awoke this morning with her left eye crusted over with yellow discharge.  The eye has been red and irritated since then.  She denies neck pain or stiffness, fevers, CP, SOB, or ear pain.   Past Medical History  Diagnosis Date  . 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 Depression   . Seasonal allergies   . Abnormal Pap smear   . Infection     UTI  . Kidney abscess     Past Surgical History  Procedure Laterality Date  . Colposcopy    . Wisdom tooth extraction    . Tubal ligation    . Anterior cruciate ligament repair      rt  . Urethral stint      Family History  Problem Relation Age of Onset  . Anesthesia problems Neg Hx   . Hypotension Neg Hx   . Malignant hyperthermia Neg Hx   . Pseudochol deficiency Neg Hx   . Cancer Father     2000    History  Substance Use Topics  . Smoking status: Current Every Day Smoker -- 0.50 packs/day for 26 years    Types: Cigarettes  . Smokeless tobacco: Never Used  . Alcohol Use: 0.0 oz/week     Comment: occasional     OB History   Grav Para Term Preterm Abortions TAB SAB Ect Mult Living   3 3 3       3        Review of Systems  All other systems reviewed and are negative.     Allergies  Morphine and related  Home Medications   Current Outpatient Rx  Name  Route  Sig  Dispense  Refill  . ibuprofen (ADVIL,MOTRIN) 200 MG tablet   Oral   Take 200 mg by mouth every 6 (six) hours as needed.          BP 119/76  Pulse 74  Temp(Src) 98.5 F (36.9 C) (Oral)  Resp 18  Ht 5\' 4"  (1.626 m)  Wt 151 lb (68.493 kg)  BMI 25.91 kg/m2  SpO2 98%  LMP 05/04/2013  Physical Exam  Nursing note and vitals reviewed. Constitutional: She is oriented to person, place, and time. She appears well-developed and well-nourished. No distress.  HENT:  Head: Normocephalic and atraumatic.  Right Ear: External ear normal.  Left Ear: External ear normal.  Mouth/Throat: Oropharynx is clear and moist. No oropharyngeal exudate.  Boggy turbinates, bilateral frontal sinus ttp, bilateral maxillary sinus ttp - unable to transilluminate maxillary sinuses.  Eyes: Pupils are equal, round, and reactive to light. Right eye exhibits discharge. Left  eye exhibits no discharge. No scleral icterus.  Purulent drainage from right eye  Neck: Normal range of motion. Neck supple. No spinous process tenderness and no muscular tenderness present. Normal range of motion present. No Brudzinski's sign and no Kernig's sign noted.  Cardiovascular: Normal rate, regular rhythm and normal heart sounds.  Exam reveals no gallop and no friction rub.   No murmur heard. Pulmonary/Chest: Effort normal and breath sounds normal. No respiratory distress. She has no wheezes. She has no rales. She exhibits no tenderness.  Musculoskeletal: Normal range of motion. She exhibits no edema and no tenderness.  Lymphadenopathy:    She has no cervical adenopathy.  Neurological: She is alert and oriented to person, place, and time. She exhibits normal muscle tone. Coordination normal.  Skin: Skin is warm and dry. No rash noted. No erythema. No pallor.   Psychiatric: She has a normal mood and affect. Her behavior is normal. Judgment and thought content normal.    ED Course  Procedures (including critical care time)  DIAGNOSTIC STUDIES: Oxygen Saturation is 98% on room air, normal by my interpretation.    COORDINATION OF CARE: 3:04 PM-Discussed treatment plan which includes antibiotics with pt at bedside and pt agreed to plan.    Labs Review Labs Reviewed - No data to display  Imaging Review No results found.  EKG Interpretation   None       MDM  Sinusitis Right conjunctivitis  Patient here with sinus pain and congestion x 1 week - has tried numerous OTC products wtihout relief.  Awoke with purulent drainage from right eye this morning.  Doubt meningitis, migraine, tension headache.  Will treat with antibiotics at this juncture.  I personally performed the services described in this documentation, which was scribed in my presence. The recorded information has been reviewed and is accurate.   Izola Price Marisue Humble, PA-C 05/09/13 1550

## 2013-05-10 NOTE — ED Provider Notes (Signed)
Medical screening examination/treatment/procedure(s) were performed by non-physician practitioner and as supervising physician I was immediately available for consultation/collaboration.  EKG Interpretation   None        Derwood Kaplan, MD 05/10/13 [phone]

## 2013-06-23 ENCOUNTER — Other Ambulatory Visit: Payer: Self-pay

## 2013-06-23 MED ORDER — AMOXICILLIN-POT CLAVULANATE 875-125 MG PO TABS
1.00 | ORAL_TABLET | Freq: Two times a day (BID) | ORAL | 0 refills | Status: DC
Start: 2013-06-23 — End: 2014-02-27
  Filled 2013-06-23: qty 14, 7d supply, fill #0

## 2013-06-23 MED ORDER — FLUTICASONE PROPIONATE 50 MCG/ACT NA SUSP
1.00 | Freq: Two times a day (BID) | NASAL | 0 refills | Status: DC
Start: 2013-06-23 — End: 2014-02-27
  Filled 2013-06-23: qty 16, 30d supply, fill #0

## 2013-07-15 ENCOUNTER — Other Ambulatory Visit: Payer: Self-pay

## 2013-07-15 DIAGNOSIS — B3731 Acute candidiasis of vulva and vagina: Secondary | ICD-10-CM | POA: Insufficient documentation

## 2013-07-15 MED ORDER — FLUCONAZOLE 150 MG PO TABS
ORAL_TABLET | ORAL | 0 refills | Status: DC
Start: 2013-07-15 — End: 2014-08-21
  Filled 2013-07-15: qty 1, 30d supply, fill #0
  Filled 2013-08-15: qty 2, 7d supply, fill #1
  Filled 2013-08-15: qty 1, 30d supply, fill #1

## 2013-08-15 ENCOUNTER — Other Ambulatory Visit: Payer: Self-pay

## 2013-08-15 MED ORDER — METHYLPHENIDATE HCL 5 MG PO TABS
5.00 mg | ORAL_TABLET | ORAL | 0 refills | Status: DC | PRN
Start: 2013-08-15 — End: 2014-02-27
  Filled 2013-08-15: qty 60, 30d supply, fill #0

## 2013-08-15 MED ORDER — FLUCONAZOLE 150 MG PO TABS
ORAL_TABLET | ORAL | 2 refills | Status: DC
Start: 2013-08-15 — End: 2014-02-27
  Filled 2013-08-15: qty 1, 30d supply, fill #0
  Filled 2013-08-15: qty 3, 21d supply, fill #1

## 2013-09-30 DIAGNOSIS — Z9189 Other specified personal risk factors, not elsewhere classified: Secondary | ICD-10-CM | POA: Insufficient documentation

## 2013-09-30 DIAGNOSIS — R519 Headache, unspecified: Secondary | ICD-10-CM | POA: Insufficient documentation

## 2013-10-21 ENCOUNTER — Other Ambulatory Visit: Payer: Self-pay

## 2013-10-21 MED ORDER — FLUCONAZOLE 150 MG PO TABS
150.00 mg | ORAL_TABLET | ORAL | 0 refills | Status: DC
Start: 2013-10-21 — End: 2014-02-27
  Filled 2013-10-21: qty 1, 30d supply, fill #0
  Filled 2013-11-10: qty 1, 30d supply, fill #1

## 2013-10-21 MED ORDER — METRONIDAZOLE 500 MG PO TABS
500.00 mg | ORAL_TABLET | Freq: Two times a day (BID) | ORAL | 0 refills | Status: DC
Start: 2013-10-21 — End: 2014-02-27
  Filled 2013-10-21: qty 14, 7d supply, fill #0

## 2013-11-10 ENCOUNTER — Other Ambulatory Visit: Payer: Self-pay

## 2014-01-11 ENCOUNTER — Other Ambulatory Visit: Payer: Self-pay

## 2014-01-11 MED ORDER — AMOXICILLIN 500 MG PO CAPS
ORAL_CAPSULE | ORAL | 0 refills | Status: DC
Start: 2014-01-11 — End: 2014-02-27
  Filled 2014-01-11: qty 20, 10d supply, fill #0

## 2014-01-17 ENCOUNTER — Encounter (HOSPITAL_COMMUNITY): Payer: Self-pay | Admitting: Emergency Medicine

## 2014-01-17 ENCOUNTER — Emergency Department (HOSPITAL_COMMUNITY)
Admission: EM | Admit: 2014-01-17 | Discharge: 2014-01-17 | Disposition: A | Discharge status: Home / Self Care | Payer: Self-pay | Attending: Emergency Medicine | Admitting: Emergency Medicine

## 2014-01-17 DIAGNOSIS — Z22330 Carrier of Group B streptococcus: Secondary | ICD-10-CM | POA: Insufficient documentation

## 2014-01-17 DIAGNOSIS — Z87448 Personal history of other diseases of urinary system: Secondary | ICD-10-CM | POA: Insufficient documentation

## 2014-01-17 DIAGNOSIS — R350 Frequency of micturition: Secondary | ICD-10-CM | POA: Insufficient documentation

## 2014-01-17 DIAGNOSIS — L0291 Cutaneous abscess, unspecified: Secondary | ICD-10-CM

## 2014-01-17 DIAGNOSIS — R319 Hematuria, unspecified: Secondary | ICD-10-CM | POA: Insufficient documentation

## 2014-01-17 DIAGNOSIS — Z8619 Personal history of other infectious and parasitic diseases: Secondary | ICD-10-CM | POA: Insufficient documentation

## 2014-01-17 DIAGNOSIS — Z8659 Personal history of other mental and behavioral disorders: Secondary | ICD-10-CM | POA: Insufficient documentation

## 2014-01-17 DIAGNOSIS — L0231 Cutaneous abscess of buttock: Secondary | ICD-10-CM | POA: Insufficient documentation

## 2014-01-17 DIAGNOSIS — L03317 Cellulitis of buttock: Principal | ICD-10-CM

## 2014-01-17 DIAGNOSIS — F17200 Nicotine dependence, unspecified, uncomplicated: Secondary | ICD-10-CM | POA: Insufficient documentation

## 2014-01-17 LAB — URINALYSIS, ROUTINE W REFLEX MICROSCOPIC
Bilirubin Urine: NEGATIVE
Glucose, UA: NEGATIVE mg/dL
Hgb urine dipstick: NEGATIVE
Ketones, ur: NEGATIVE mg/dL
Leukocytes, UA: NEGATIVE
NITRITE: NEGATIVE
Protein, ur: NEGATIVE mg/dL
SPECIFIC GRAVITY, URINE: 1.008 (ref 1.005–1.030)
UROBILINOGEN UA: 0.2 mg/dL (ref 0.0–1.0)
pH: 6 (ref 5.0–8.0)

## 2014-01-17 MED ORDER — SULFAMETHOXAZOLE-TRIMETHOPRIM 800-160 MG PO TABS: 1.0000 | ORAL_TABLET | Freq: Two times a day (BID) | ORAL | Status: AC

## 2014-01-17 MED ORDER — TRAMADOL HCL 50 MG PO TABS: 50.0000 mg | ORAL_TABLET | Freq: Four times a day (QID) | ORAL | Status: DC | PRN

## 2014-01-17 NOTE — ED Provider Notes (Signed)
Medical screening examination/treatment/procedure(s) were performed by non-physician practitioner and as supervising physician I was immediately available for consultation/collaboration.   EKG Interpretation None      Devoria AlbeIva Taunya Goral, MD, Armando GangFACEP   Ward GivensIva L Davine Sweney, MD 01/17/14 1537

## 2014-01-17 NOTE — ED Provider Notes (Signed)
CSN: [id number]     Arrival date & time 01/17/14  1107 History   First MD Initiated Contact with Patient 01/17/14 1203     Chief Complaint  Patient presents with  . Abscess     (Consider location/radiation/quality/duration/timing/severity/associated sxs/prior Treatment) HPI Comments: Patients with no significant past medical history presents with complaint of pain, swelling, drainage of pus from an area between her buttocks that she first noticed 3-4 days ago. Less drainage today. No fever, N/V/D. No treatments prior. No pain with defecation or blood in stool.   Patient also complains of hematuria with increased frequency and urgency during the same amount of time. She has history of UTI. No back pain.  Patient is a 38 y.o. female presenting with abscess. The history is provided by the patient and a friend.  Abscess Associated symptoms: no fever, no headaches, no nausea and no vomiting     Past Medical History  Diagnosis Date  . 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 Depression   . Seasonal allergies   . Abnormal Pap smear   . Infection     UTI  . Kidney abscess    Past Surgical History  Procedure Laterality Date  . Colposcopy    . Wisdom tooth extraction    . Tubal ligation    . Anterior cruciate ligament repair      rt  . Urethral stint     Family History  Problem Relation Age of Onset  . Anesthesia problems Neg Hx   . Hypotension Neg Hx   . Malignant hyperthermia Neg Hx   . Pseudochol deficiency Neg Hx   . Cancer Father     2000   History  Substance Use Topics  . Smoking status: Current Every Day Smoker -- 0.50 packs/day for 26 years    Types: Cigarettes  . Smokeless tobacco: Never Used  . Alcohol Use: 0.0 oz/week     Comment: occasional    OB History   Grav Para Term Preterm Abortions TAB SAB Ect Mult Living   3 3 3       3      Review of Systems  Constitutional: Negative for fever.  HENT: Negative for rhinorrhea and sore  throat.   Eyes: Negative for redness.  Respiratory: Negative for cough.   Cardiovascular: Negative for chest pain.  Gastrointestinal: Negative for nausea, vomiting, abdominal pain and diarrhea.  Genitourinary: Positive for frequency and hematuria. Negative for dysuria and vaginal discharge.  Musculoskeletal: Negative for myalgias.  Skin: Negative for rash.       Positive for abscess.   Neurological: Negative for headaches.      Allergies  Morphine and related  Home Medications   Prior to Admission medications   Not on File   BP 106/70  Pulse 91  Temp(Src) 98.7 F (37.1 C) (Oral)  Resp 14  Ht 5\' 4"  (1.626 m)  Wt 165 lb (74.844 kg)  BMI 28.31 kg/m2  SpO2 94%  LMP 01/09/2014  Physical Exam  Nursing note and vitals reviewed. Constitutional: She appears well-developed and well-nourished.  HENT:  Head: Normocephalic and atraumatic.  Eyes: Conjunctivae are normal. Right eye exhibits no discharge. Left eye exhibits no discharge.  Neck: Normal range of motion. Neck supple.  Cardiovascular: Normal rate, regular rhythm and normal heart sounds.   No murmur heard. Pulmonary/Chest: Effort normal and breath sounds normal. No respiratory distress. She has no wheezes. She has no rales.  Abdominal:  Soft. There is no tenderness. There is no rebound, no guarding and no CVA tenderness.  Neurological: She is alert.  Skin: Skin is warm and dry.  Small open abscess without significant fluctuance of gluteal cleft. No surrounding cellulitis. It is not near the rectum. Mild tenderness.   Psychiatric: She has a normal mood and affect.    ED Course  Procedures (including critical care time) Labs Review Labs Reviewed  URINALYSIS, ROUTINE W REFLEX MICROSCOPIC    Imaging Review No results found.   EKG Interpretation None      Patient seen and examined. Work-up initiated. Medications ordered.   Vital signs reviewed and are as follows: BP 106/70  Pulse 91  Temp(Src) 98.7 F (37.1  C) (Oral)  Resp 14  Ht 5\' 4"  (1.626 m)  Wt 165 lb (74.844 kg)  BMI 28.31 kg/m2  SpO2 94%  LMP 01/09/2014  1:48 PM The patient was urged to return to the Emergency Department urgently with worsening pain, swelling, expanding erythema especially if it streaks away from the affected area, fever, or if they have any other concerns.   The patient was urged to return to the Emergency Department or go to their PCP in 48 hours for wound recheck if the area is not significantly improved.  Patient counseled on use of narcotic pain medications. Counseled not to combine these medications with others containing tylenol. Urged not to drink alcohol, drive, or perform any other activities that requires focus while taking these medications. The patient verbalizes understanding and agrees with the plan.  The patient verbalized understanding and stated agreement with this plan.     MDM   Final diagnoses:  Abscess   Patient with small gluteal cleft abscess that does not appear to involve the rectum. No surrounding cellulitis. Given drainage of pus and location will give course of Bactrim. Patient counseled to do warm soaks daily as well. Appropriate return instructions discussed with patient.  UTI symptoms: UA is negative for infection or blood at this time. If patient does have an occult infection, Bactrim may also help. She does not appear toxic or systemically ill today. No signs of pyelo.     Renne Crigler, PA-C 01/17/14 1350

## 2014-01-17 NOTE — ED Notes (Signed)
Patient reports that she has had an abscess on the coccyx x 4 days which has gotten progressively worse. Patient denies any fever, N/V.

## 2014-01-17 NOTE — Discharge Instructions (Signed)
Please read and follow all provided instructions.  Your diagnoses today include:  1. Abscess     Tests performed today include:  Vital signs. See below for your results today.  Urine test - does not show any blood or infection   Medications prescribed:   Bactrim (trimethoprim/sulfamethoxazole) - antibiotic  You have been prescribed an antibiotic medicine: take the entire course of medicine even if you are feeling better. Stopping early can cause the antibiotic not to work.   Tramadol - narcotic-like pain medication  DO NOT drive or perform any activities that require you to be awake and alert because this medicine can make you drowsy.   Take any prescribed medications only as directed.   Home care instructions:   Follow any educational materials contained in this packet  Follow-up instructions: Return to the Emergency Department in 48 hours for a recheck if your symptoms are not significantly improved.  Please follow-up with your primary care provider in the next 1 week for further evaluation of your symptoms.   Return instructions:  Return to the Emergency Department if you have:  Fever  Worsening symptoms  Worsening pain  Worsening swelling  Redness of the skin that moves away from the affected area, especially if it streaks away from the affected area   Any other emergent concerns  Your vital signs today were: BP 106/70   Pulse 91   Temp(Src) 98.7 F (37.1 C) (Oral)   Resp 14   Ht 5\' 4"  (1.626 m)   Wt 165 lb (74.844 kg)   BMI 28.31 kg/m2   SpO2 94%   LMP 01/09/2014 If your blood pressure (BP) was elevated above 135/85 this visit, please have this repeated by your doctor within one month. --------------

## 2014-02-14 ENCOUNTER — Emergency Department (HOSPITAL_COMMUNITY): Payer: Self-pay

## 2014-02-14 ENCOUNTER — Emergency Department (HOSPITAL_COMMUNITY)
Admission: EM | Admit: 2014-02-14 | Discharge: 2014-02-15 | Disposition: A | Discharge status: Home / Self Care | Payer: Self-pay | Attending: Emergency Medicine | Admitting: Emergency Medicine

## 2014-02-14 ENCOUNTER — Encounter (HOSPITAL_COMMUNITY): Payer: Self-pay | Admitting: Emergency Medicine

## 2014-02-14 DIAGNOSIS — Z87440 Personal history of urinary (tract) infections: Secondary | ICD-10-CM | POA: Insufficient documentation

## 2014-02-14 DIAGNOSIS — Z3202 Encounter for pregnancy test, result negative: Secondary | ICD-10-CM | POA: Insufficient documentation

## 2014-02-14 DIAGNOSIS — R109 Unspecified abdominal pain: Secondary | ICD-10-CM | POA: Insufficient documentation

## 2014-02-14 DIAGNOSIS — Z8659 Personal history of other mental and behavioral disorders: Secondary | ICD-10-CM | POA: Insufficient documentation

## 2014-02-14 DIAGNOSIS — F17200 Nicotine dependence, unspecified, uncomplicated: Secondary | ICD-10-CM | POA: Insufficient documentation

## 2014-02-14 DIAGNOSIS — Z872 Personal history of diseases of the skin and subcutaneous tissue: Secondary | ICD-10-CM | POA: Insufficient documentation

## 2014-02-14 DIAGNOSIS — N730 Acute parametritis and pelvic cellulitis: Secondary | ICD-10-CM | POA: Insufficient documentation

## 2014-02-14 LAB — COMPREHENSIVE METABOLIC PANEL
ALT: 22 U/L (ref 0–35)
AST: 19 U/L (ref 0–37)
Albumin: 3.2 g/dL — ABNORMAL LOW (ref 3.5–5.2)
Alkaline Phosphatase: 92 U/L (ref 39–117)
Anion gap: 9 (ref 5–15)
BILIRUBIN TOTAL: 0.4 mg/dL (ref 0.3–1.2)
BUN: 10 mg/dL (ref 6–23)
CALCIUM: 8.9 mg/dL (ref 8.4–10.5)
CO2: 24 meq/L (ref 19–32)
Chloride: 105 mEq/L (ref 96–112)
Creatinine, Ser: 0.64 mg/dL (ref 0.50–1.10)
GLUCOSE: 101 mg/dL — AB (ref 70–99)
Potassium: 3.7 mEq/L (ref 3.7–5.3)
Sodium: 138 mEq/L (ref 137–147)
Total Protein: 6.8 g/dL (ref 6.0–8.3)

## 2014-02-14 LAB — URINALYSIS, ROUTINE W REFLEX MICROSCOPIC
Bilirubin Urine: NEGATIVE
GLUCOSE, UA: NEGATIVE mg/dL
KETONES UR: NEGATIVE mg/dL
Nitrite: NEGATIVE
Protein, ur: NEGATIVE mg/dL
Specific Gravity, Urine: 1.007 (ref 1.005–1.030)
Urobilinogen, UA: 0.2 mg/dL (ref 0.0–1.0)
pH: 6 (ref 5.0–8.0)

## 2014-02-14 LAB — CBC WITH DIFFERENTIAL/PLATELET
BASOS ABS: 0 10*3/uL (ref 0.0–0.1)
Basophils Relative: 0 % (ref 0–1)
EOS PCT: 1 % (ref 0–5)
Eosinophils Absolute: 0.1 10*3/uL (ref 0.0–0.7)
HCT: 43.6 % (ref 36.0–46.0)
Hemoglobin: 14.6 g/dL (ref 12.0–15.0)
LYMPHS ABS: 1.1 10*3/uL (ref 0.7–4.0)
LYMPHS PCT: 12 % (ref 12–46)
MCH: 32.1 pg (ref 26.0–34.0)
MCHC: 33.5 g/dL (ref 30.0–36.0)
MCV: 95.8 fL (ref 78.0–100.0)
Monocytes Absolute: 0.9 10*3/uL (ref 0.1–1.0)
Monocytes Relative: 9 % (ref 3–12)
NEUTROS PCT: 78 % — AB (ref 43–77)
Neutro Abs: 7.3 10*3/uL (ref 1.7–7.7)
Platelets: 139 10*3/uL — ABNORMAL LOW (ref 150–400)
RBC: 4.55 MIL/uL (ref 3.87–5.11)
RDW: 12.4 % (ref 11.5–15.5)
WBC: 9.4 10*3/uL (ref 4.0–10.5)

## 2014-02-14 LAB — URINE MICROSCOPIC-ADD ON

## 2014-02-14 LAB — WET PREP, GENITAL
Trich, Wet Prep: NONE SEEN
Yeast Wet Prep HPF POC: NONE SEEN

## 2014-02-14 LAB — PREGNANCY, URINE: Preg Test, Ur: NEGATIVE

## 2014-02-14 LAB — LIPASE, BLOOD: Lipase: 19 U/L (ref 11–59)

## 2014-02-14 MED ORDER — DIPHENHYDRAMINE HCL 50 MG/ML IJ SOLN
25.0000 mg | Freq: Once | INTRAMUSCULAR | Status: AC
  Administered 2014-02-14: 25 mg via INTRAVENOUS
  Filled 2014-02-14: qty 1

## 2014-02-14 MED ORDER — LIDOCAINE HCL 1 % IJ SOLN
INTRAMUSCULAR | Status: AC
  Administered 2014-02-14: 0.9 mL
  Filled 2014-02-14: qty 20

## 2014-02-14 MED ORDER — SODIUM CHLORIDE 0.9 % IV SOLN
INTRAVENOUS | Status: DC
  Administered 2014-02-14: 15:00:00 via INTRAVENOUS

## 2014-02-14 MED ORDER — OXYCODONE-ACETAMINOPHEN 7.5-325 MG PO TABS: 1.0000 | ORAL_TABLET | ORAL | Status: DC | PRN

## 2014-02-14 MED ORDER — IOHEXOL 300 MG/ML  SOLN
100.0000 mL | Freq: Once | INTRAMUSCULAR | Status: AC | PRN
  Administered 2014-02-14: 100 mL via INTRAVENOUS

## 2014-02-14 MED ORDER — CEFTRIAXONE SODIUM 250 MG IJ SOLR
250.0000 mg | Freq: Once | INTRAMUSCULAR | Status: AC
  Administered 2014-02-14: 250 mg via INTRAMUSCULAR
  Filled 2014-02-14: qty 250

## 2014-02-14 MED ORDER — DOXYCYCLINE HYCLATE 100 MG PO CAPS: 100.0000 mg | ORAL_CAPSULE | Freq: Two times a day (BID) | ORAL | Status: DC

## 2014-02-14 MED ORDER — LORAZEPAM 2 MG/ML IJ SOLN
1.0000 mg | Freq: Once | INTRAMUSCULAR | Status: AC
  Administered 2014-02-14: 1 mg via INTRAVENOUS
  Filled 2014-02-14: qty 1

## 2014-02-14 MED ORDER — HYDROMORPHONE HCL PF 1 MG/ML IJ SOLN
1.0000 mg | Freq: Once | INTRAMUSCULAR | Status: AC
  Administered 2014-02-14: 1 mg via INTRAVENOUS
  Filled 2014-02-14: qty 1

## 2014-02-14 MED ORDER — SODIUM CHLORIDE 0.9 % IV BOLUS (SEPSIS)
1000.0000 mL | Freq: Once | INTRAVENOUS | Status: AC
  Administered 2014-02-14: 1000 mL via INTRAVENOUS

## 2014-02-14 MED ORDER — ONDANSETRON HCL 4 MG/2ML IJ SOLN
4.0000 mg | Freq: Once | INTRAMUSCULAR | Status: AC
  Administered 2014-02-14: 4 mg via INTRAVENOUS
  Filled 2014-02-14: qty 2

## 2014-02-14 MED ORDER — HYDROMORPHONE HCL PF 1 MG/ML IJ SOLN
1.0000 mg | Freq: Once | INTRAMUSCULAR | Status: AC | PRN
  Administered 2014-02-14: 1 mg via INTRAVENOUS
  Filled 2014-02-14: qty 1

## 2014-02-14 NOTE — Discharge Instructions (Signed)
Pelvic Inflammatory Disease °Pelvic inflammatory disease (PID) refers to an infection in some or all of the female organs. The infection can be in the uterus, ovaries, fallopian tubes, or the surrounding tissues in the pelvis. PID can cause abdominal or pelvic pain that comes on suddenly (acute pelvic pain). PID is a serious infection because it can lead to lasting (chronic) pelvic pain or the inability to have children (infertile).  °CAUSES  °The infection is often caused by the normal bacteria found in the vaginal tissues. PID may also be caused by an infection that is spread during sexual contact. PID can also occur following:  °· The birth of a baby.   °· A miscarriage.   °· An abortion.   °· Major pelvic surgery.   °· The use of an intrauterine device (IUD).   °· A sexual assault.   °RISK FACTORS °Certain factors can put a person at higher risk for PID, such as: °· Being younger than 25 years. °· Being sexually active at a young age. °· Using nonbarrier contraception. °· Having multiple sexual partners. °· Having sex with someone who has symptoms of a genital infection. °· Using oral contraception. °Other times, certain behaviors can increase the possibility of getting PID, such as: °· Having sex during your period. °· Using a vaginal douche. °· Having an intrauterine device (IUD) in place. °SYMPTOMS  °· Abdominal or pelvic pain.   °· Fever.   °· Chills.   °· Abnormal vaginal discharge. °· Abnormal uterine bleeding.   °· Unusual pain shortly after finishing your period. °DIAGNOSIS  °Your caregiver will choose some of the following methods to make a diagnosis, such as:  °· Performing a physical exam and history. A pelvic exam typically reveals a very tender uterus and surrounding pelvis.   °· Ordering laboratory tests including a pregnancy test, blood tests, and urine test.  °· Ordering cultures of the vagina and cervix to check for a sexually transmitted infection (STI). °· Performing an ultrasound.    °· Performing a laparoscopic procedure to look inside the pelvis.   °TREATMENT  °· Antibiotic medicines may be prescribed and taken by mouth.   °· Sexual partners may be treated when the infection is caused by a sexually transmitted disease (STD).   °· Hospitalization may be needed to give antibiotics intravenously. °· Surgery may be needed, but this is rare. °It may take weeks until you are completely well. If you are diagnosed with PID, you should also be checked for human immunodeficiency virus (HIV).   °HOME CARE INSTRUCTIONS  °· If given, take your antibiotics as directed. Finish the medicine even if you start to feel better.   °· Only take over-the-counter or prescription medicines for pain, discomfort, or fever as directed by your caregiver.   °· Do not have sexual intercourse until treatment is completed or as directed by your caregiver. If PID is confirmed, your recent sexual partner(s) will need treatment.   °· Keep your follow-up appointments. °SEEK MEDICAL CARE IF:  °· You have increased or abnormal vaginal discharge.   °· You need prescription medicine for your pain.   °· You vomit.   °· You cannot take your medicines.   °· Your partner has an STD.   °SEEK IMMEDIATE MEDICAL CARE IF:  °· You have a fever.   °· You have increased abdominal or pelvic pain.   °· You have chills.   °· You have pain when you urinate.   °· You are not better after 72 hours following treatment.   °MAKE SURE YOU:  °· Understand these instructions. °· Will watch your condition. °· Will get help right away if you are not doing well or get worse. °  Document Released: 05/19/2005 Document Revised: 09/13/2012 Document Reviewed: 05/15/2011 °ExitCare® Patient Information ©2015 [employer]. This information is not intended to replace advice given to you by your health care provider. Make sure you discuss any questions you have with your health care provider. ° °

## 2014-02-14 NOTE — Progress Notes (Signed)
[facility] [employer]  Provided pt with a list of primary care resources, to help patient establish a pcp.

## 2014-02-14 NOTE — Progress Notes (Signed)
  CARE MANAGEMENT ED NOTE 02/14/2014  Patient:  Crystal Hernandez, Crystal Hernandez   Account Number:  [id number]  Date Initiated:  02/14/2014  Documentation initiated by:  Radford Pax  Subjective/Objective Assessment:   Patient presents to Ed with left lower abdominal pain which began yesterday.     Subjective/Objective Assessment Detail:     Action/Plan:   Action/Plan Detail:   Anticipated DC Date:       Status Recommendation to Physician:   Result of Recommendation:    Other ED Services  Consult Working Plan    DC Planning Services  Other  PCP issues    Choice offered to / List presented to:            Status of service:  Completed, signed off  ED Comments:   ED Comments Detail:  EDCM spoke to patient at bedside.  Patient reports she does not have a pcp or insurance living in [place]. [facility] provided patient with pamphlet to [facility].  EDCM explained at [facility] she may establish care, receive assistance with her medications, speak to a financial counselor or social worker and enroll for the orange card. EDCM also provided patient with list of pcps who accept self pay patients, list of discounted pharmacies and websites needymeds.org and goodrx.com for medication assistance, phone number to inquire about Affordable Care Act and phone number for DSS for Medicaid for insurnace, financial resources in the community such as local churches and [employer], and dental assistance for uninsured patients.  Patient thankful for resources.  No further EDCM needs at this time.

## 2014-02-14 NOTE — ED Provider Notes (Signed)
CSN: [id number]     Arrival date & time 02/14/14  1234 History   First MD Initiated Contact with Patient 02/14/14 1308     Chief Complaint  Patient presents with  . Abdominal Pain     (Consider location/radiation/quality/duration/timing/severity/associated sxs/prior Treatment) HPI Comments: Patient here complaining of severe lower abdominal pain localized to her left side which began yesterday. 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 Denies any fever or chills. No vaginal bleeding. No urinary symptoms. Pain characterized as sharp and worse with movement. Has used Motrin without relief. Denies any recent weight loss or night sweats.   The history is provided by the patient.    Past Medical History  Diagnosis Date  . 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 Depression   . Seasonal allergies   . Abnormal Pap smear   . Infection     UTI  . Kidney abscess    Past Surgical History  Procedure Laterality Date  . Colposcopy    . Wisdom tooth extraction    . Tubal ligation    . Anterior cruciate ligament repair      rt  . Urethral stint     Family History  Problem Relation Age of Onset  . Anesthesia problems Neg Hx   . Hypotension Neg Hx   . Malignant hyperthermia Neg Hx   . Pseudochol deficiency Neg Hx   . Cancer Father     2000   History  Substance Use Topics  . Smoking status: Current Every Day Smoker -- 0.50 packs/day for 26 years    Types: Cigarettes  . Smokeless tobacco: Never Used  . Alcohol Use: 0.0 oz/week     Comment: occasional    OB History   Grav Para Term Preterm Abortions TAB SAB Ect Mult Living   Review of Systems  All other systems reviewed and are negative.     Allergies  Morphine and related  Home Medications   Prior to Admission medications   Medication Sig Start Date End Date Taking? Authorizing  Provider  ibuprofen (ADVIL,MOTRIN) 200 MG tablet Take 200 mg by mouth every 6 (six) hours as needed for moderate pain.   Yes Historical Provider, MD   BP 114/79  Pulse 94  Temp(Src) 98.5 F (36.9 C) (Oral)  Resp 20  SpO2 97%  LMP 02/09/2014 Physical Exam  Nursing note and vitals reviewed. Constitutional: She is oriented to person, place, and time. She appears well-developed and well-nourished.  Non-toxic appearance. No distress.  HENT:  Head: Normocephalic and atraumatic.  Eyes: Conjunctivae, EOM and lids are normal. Pupils are equal, round, and reactive to light.  Neck: Normal range of motion. Neck supple. No tracheal deviation present. No mass present.  Cardiovascular: Normal rate, regular rhythm and normal heart sounds.  Exam reveals no gallop.   No murmur heard. Pulmonary/Chest: Effort normal and breath sounds normal. No stridor. No respiratory distress. She has no decreased breath sounds. She has no wheezes. She has no rhonchi. She has no rales.  Abdominal: Soft. Normal appearance and bowel sounds are normal. She exhibits no distension. There is no tenderness. There is no rigidity, no rebound, no guarding and no CVA tenderness.    Genitourinary: Cervix exhibits motion tenderness. No bleeding around the vagina.  Vaginal discharge found.  Musculoskeletal: Normal range of motion. She exhibits no edema and no tenderness.  Neurological: She is alert and oriented to person, place, and time. She has normal strength. No cranial nerve deficit or sensory deficit. GCS eye subscore is 4. GCS verbal subscore is 5. GCS motor subscore is 6.  Skin: Skin is warm and dry. No abrasion and no rash noted.  Psychiatric: She has a normal mood and affect. Her speech is normal and behavior is normal.    ED Course  Procedures (including critical care time) Labs Review Labs Reviewed  CBC WITH DIFFERENTIAL - Abnormal; Notable for the following:    Platelets 139 (*)    Neutrophils Relative % 78 (*)     All other components within normal limits  COMPREHENSIVE METABOLIC PANEL - Abnormal; Notable for the following:    Glucose, Bld 101 (*)    Albumin 3.2 (*)    All other components within normal limits  URINALYSIS, ROUTINE W REFLEX MICROSCOPIC - Abnormal; Notable for the following:    Hgb urine dipstick SMALL (*)    Leukocytes, UA SMALL (*)    All other components within normal limits  URINE MICROSCOPIC-ADD ON - Abnormal; Notable for the following:    Squamous Epithelial / LPF FEW (*)    Bacteria, UA MANY (*)    All other components within normal limits  LIPASE, BLOOD  PREGNANCY, URINE    Imaging Review No results found.   EKG Interpretation None      MDM   Final diagnoses:  None    Patient's pelvic exam is for PID. Given dose of Rocephin here. Pelvic ultrasound is pending at this time and signed out to oncoming provider   Toy Baker, MD 02/14/14 1609

## 2014-02-14 NOTE — ED Notes (Signed)
US at bedside

## 2014-02-14 NOTE — ED Notes (Signed)
Patient transported to Ultrasound 

## 2014-02-14 NOTE — ED Notes (Signed)
Pt c/o lower abd pain x 1 day, denies v/d but states she feels nauseous.

## 2014-02-14 NOTE — ED Notes (Signed)
Patient transported to CT 

## 2014-02-14 NOTE — ED Notes (Addendum)
Pt continues to ask for something to drink.  This RN informed the Pt that she cannot have a drink until after the 2nd [place] is completed.  Pt informed that we are waiting for [place] techs to arrive, so the 2nd [place] can be done and that pain medication will be given directly before the procedure.  Pt is unhappy with this information, but verbalized understanding.

## 2014-02-15 LAB — GC/CHLAMYDIA PROBE AMP
CT Probe RNA: NEGATIVE
GC PROBE AMP APTIMA: NEGATIVE

## 2014-02-15 MED ORDER — OXYCODONE-ACETAMINOPHEN 5-325 MG PO TABS
1.0000 | ORAL_TABLET | Freq: Once | ORAL | Status: AC
  Administered 2014-02-15: 1 via ORAL
  Filled 2014-02-15: qty 1

## 2014-02-15 NOTE — ED Provider Notes (Signed)
Care assumed from Dr. Freida Busman. Plan was to wait for pelvic ultrasound to rule out tubo-ovarian abscess. There was a computer error which did not allow for radiology report crossover into the EMR. However, I discussed ultrasound directly with Dr. Manson Passey, who is the radiologist who read the report originally. She stated that the patient had a large ovarian mass for which she recommended outpatient MRI to further evaluate. Structure did not appear to be consistent with tubo-ovarian abscess or ovarian torsion. I discussed this finding with the patient and advised followup with gynecology.  Clinical Impression: 1. PID (acute pelvic inflammatory disease)       Candyce Churn III, MD 02/15/14 0040

## 2014-02-15 NOTE — Progress Notes (Signed)
02/15/14 1355 Husband arrived to pick up [facility] letter and self pay [facility] resources Reviewed information requested by [facility] 1346 Pt returned a call to Cm with questions with concerns to PID dx Cm answered pt's questions, encouraged practicing safe sexual encounters and continued treatment. Pt inquired about positive results CM informed her that if any of her test were positive she would have been informed by EDP or called by [facility] medical staff to update her on further test results.  Pt voiced anger at her husband "Has he been stepping out on me?" CM referred pt to her husband and encouraged her to encourage him to be tested as needed  1334 CM spoke with Josph Macho at [facility] [address] [phone] Discussed pt returning with a [facility] letter to get rest of Rx filled Pt states she had already paid for a 3 day worth of doxycycline Cm encouraged her to speak with pharmacist about [facility] letter possible refund. Kha requested Cm inform pt that the best time to return is "between 6 and 9 pm" Cm wrote this information on pt's list of resources [id number] No return call from pt therefore Cm returned a call to her  CM reviewed EPIC notes and chart review information CM spoke with the pt about [facility] MATCH program ($3 co pay for each Rx through [facility] program, does not include refills, 7 day expiration of MATCH letter and choice of pharmacies) Pt agreed to receive assistance from program    Pt is eligible for [facility] MATCH program (unable to find pt listed in PDMI per cardholder name inquiry) PDMI information entered. MATCH letter completed Pt seen by [facility] staff on 02/14/14   CM spoke with pt again who confirms self pay [facility] resident with no pcp. CM discussed information for self pay pcps, importance of pcp for f/u care. Pt voiced understanding and appreciation of resources provided and need to obtain a self pay pcp   Pt sending her husband to pick up her MATCH letter from [facility] ED   1214 no answer; left a voice  message for pt at [phone] to offer assistance with medication and Cm office number for a return call from her to provide a better time to reach her  1110 ED CM received a message from an ED RN that pt called about medication cost/assistance left [phone] as return call. Cm noted pt d/c 02/14/14 after concern with abdominal pain with rx for percocet and doxycycline for Dx PID

## 2014-02-15 NOTE — ED Notes (Signed)
Patient is alert and oriented x3.  She was given DC instructions and follow up visit instructions.  Patient gave verbal understanding. She was DC ambulatory under her own power to home.  V/S stable.  He was not showing any signs of distress on DC 

## 2014-02-16 NOTE — Progress Notes (Signed)
                  Dear __________[name]_________________:  Bonita Quin have been approved to have your discharge prescriptions filled through our [facility] (Medication Assistance Through [facility]) program. This program allows for a one-time (no refills) 34-day supply of selected medications for a low copay amount.  The copay is $3.00 per prescription. For instance, if you have one prescription, you will pay $3.00; for two prescriptions, you pay $6.00; for three prescriptions, you pay $9.00; and so on.  Only certain pharmacies are participating in this program with [facility]. You will need to select one of the pharmacies from the attached list and take your prescriptions, this letter, and your photo ID to one of the participating pharmacies.   We are excited that you are able to use the [facility] program to get your medications. These prescriptions must be filled within 7 days of hospital discharge or they will no longer be valid for the [facility] program. Should you have any problems with your prescriptions please contact your case management team member at [phone].  Thank you,   [facility]   Participating [facility]  [facility]   [facility] [id number] [place], [place]   [facility] [address] [place], [place]   [facility] [address] [place], [place]   [facility]   [address] [place], [place]   [address], [place], [place]   [address], [place], [place]   [address], [place], [place]   [address], [place], [place]   [address], [place], [place]   [address], [place], [place]   [address] [place], [place]   [address], [place], [place]   [address] [place], [place], [place]  [facility]   [address] [place], [place]   [address] [place],  [place]   [address] [place], [place], [place]   [address] [place], [place]   [address], [place], [place]   [address] [place] [address] [place], [place], [place] [facility]   [address] [place], [place]   [address] [employer], [place], [place]   [address] [place], [place]   [address], [place], [place]   [address], [place], [place]   [address] [place], [place]   [address] [place], [place]   [address], [place], [place]   [address], [place], [place]   [address], [place], [place]   [address], [place], [place]   [address] [place], [place]   [address] [place], [place]   [address] [place], [place], [place]  Independent Pharmacies   [facility] [address] [place], [address] [place], [place]   [facility] [address] [place], [place]   [facility] [address] [place], [place]   For continued medication needs, please contact the [facility] Department at  [phone].

## 2014-02-27 ENCOUNTER — Encounter (INDEPENDENT_AMBULATORY_CARE_PROVIDER_SITE_OTHER): Payer: Self-pay | Admitting: Internal Medicine

## 2014-02-27 ENCOUNTER — Ambulatory Visit (INDEPENDENT_AMBULATORY_CARE_PROVIDER_SITE_OTHER): Payer: No Typology Code available for payment source | Admitting: Internal Medicine

## 2014-02-27 VITALS — BP 108/72 | HR 81 | Temp 98.4°F | Ht 64.45 in | Wt 140.4 lb

## 2014-02-27 DIAGNOSIS — G4700 Insomnia, unspecified: Secondary | ICD-10-CM

## 2014-02-27 DIAGNOSIS — Z0000 Encounter for general adult medical examination without abnormal findings: Secondary | ICD-10-CM

## 2014-02-27 DIAGNOSIS — F5101 Primary insomnia: Secondary | ICD-10-CM

## 2014-02-27 DIAGNOSIS — F419 Anxiety disorder, unspecified: Secondary | ICD-10-CM

## 2014-02-27 DIAGNOSIS — F411 Generalized anxiety disorder: Secondary | ICD-10-CM

## 2014-02-27 DIAGNOSIS — F988 Other specified behavioral and emotional disorders with onset usually occurring in childhood and adolescence: Secondary | ICD-10-CM

## 2014-02-27 MED ORDER — ESZOPICLONE 1 MG PO TABS
1.00 mg | ORAL_TABLET | Freq: Every evening | ORAL | Status: DC
Start: 2014-02-27 — End: 2014-08-21

## 2014-02-27 MED ORDER — METHYLPHENIDATE HCL ER (OSM) 18 MG PO TBCR
18.00 mg | EXTENDED_RELEASE_TABLET | Freq: Every day | ORAL | Status: DC
Start: 2014-02-27 — End: 2014-08-21

## 2014-02-27 MED ORDER — BUPROPION HCL 100 MG PO TABS
100.00 mg | ORAL_TABLET | Freq: Two times a day (BID) | ORAL | Status: DC
Start: 2014-02-27 — End: 2014-08-21

## 2014-02-27 NOTE — Progress Notes (Signed)
Has the patient saw any care outside of the [facility]? NO

## 2014-02-27 NOTE — Progress Notes (Signed)
Subjective:       Patient ID: Deborah Kirk is a 38 y.o. 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   HPI  Insomnia - she take Lunesta 1 mg twice weekly  Anxiety - she takes Wellbutrin 100 mg BID with improvement  ADD - she is on Concerta ER 18 mg daily    Past Surgical History   Procedure Laterality Date   . Cesarean section     . Wisdom tooth extraction       Past Medical History   Diagnosis Date   . Attention deficit hyperactivity disorder (ADHD)    . Vaginal yeast infection      chronic   . Anxiety      Allergies   Allergen Reactions   . Keflex [Cephalexin] Hives   . Nitrofurantoin      AKA Macrobid   . Percocet [Oxycodone-Acetaminophen] Hives   . Sulfa Antibiotics Hives     Medications: Concerta 188 mg, Wellbutrin 100 mg BID, Melantion 1 mg twice per week.  History   Substance Use Topics   . Smoking status: Never Smoker    . Smokeless tobacco: Not on file   . Alcohol Use: 0.0 oz/week     0 Not specified per week     Family History   Problem Relation Age of Onset   . Sleep disorder Mother    . Anxiety disorder Mother    . Hepatitis Mother    . Alzheimer's disease Father    . Thyroid disease Sister        The following portions of the patient's history were reviewed and updated as appropriate: allergies, current medications, past family history, past medical history, past social history, past surgical history and problem list.    Review of Systems   Constitutional:        She goes to gym 4 times per week.   HENT:        TMJ - resolved after caps to improve bite.   Eyes: Negative.    Respiratory: Negative.  Negative for cough and choking.    Cardiovascular: Negative.    Gastrointestinal: Negative.  Negative for abdominal distention.   Genitourinary: Negative.         Pap smear - 2 years ago.   Musculoskeletal: Positive for back pain. Negative for arthralgias.        She does stretching exercises   Skin:        Alopecia due to telogen effluvium has resolved. She had seen several endocrinologist -  multinodular goiter with normal TFTs.   Neurological: Negative.  Negative for light-headedness and headaches.   Hematological: Negative.  Does not bruise/bleed easily.   Psychiatric/Behavioral: Positive for sleep disturbance and decreased concentration.           Objective:    Physical Exam  BP 108/72 mmHg  Pulse 81  Temp(Src) 98.4 F (36.9 C) (Tympanic)  Ht 1.637 m (5' 4.45")  Wt 63.685 kg (140 lb 6.4 oz)  BMI 23.77 kg/m2  LMP 01/31/2014  Constitutional:  Alert, oriented X 3. NAD  Head    Atraumatic, normocephalic  Eye:   Conjunctival - pink  EENT   Oropharynx is clear, good dentition, lips without lesions  Neck:   Trachea is midline, no thyromegaly  Lymph:  No cervical or supraclavicular adenopathy  Chest:   Unlabored respirations. Lungs clear to auscultation, good breath      sounds  Cardiac:  Normal S1&S2 without murmurs. No  edema.  Abdomen:  Soft, nontender, no palpable masses, liver or spleen  Back   No CVA or spinous tenderness  Skin:   Warm, dry, no rashes in visible areas  Psych:   Normal affect, thought content        Assessment:       1. Routine general medical examination at a health care facility     2. Anxiety     3. Primary insomnia     4. ADD (attention deficit disorder)              Plan:      Procedures    Continue current medications, annual influenza vaccine, seat belts, regular dental visits, regular exercise,and healthy diet.

## 2014-03-03 ENCOUNTER — Ambulatory Visit (INDEPENDENT_AMBULATORY_CARE_PROVIDER_SITE_OTHER): Payer: No Typology Code available for payment source

## 2014-03-03 DIAGNOSIS — Z23 Encounter for immunization: Secondary | ICD-10-CM

## 2014-03-03 DIAGNOSIS — Z0000 Encounter for general adult medical examination without abnormal findings: Secondary | ICD-10-CM

## 2014-03-03 NOTE — Progress Notes (Signed)
Patient presented to the office for Influenza vaccine administration.  Received injection in the Left arm.  No reaction was noted and patient left in good condition

## 2014-04-03 ENCOUNTER — Encounter (HOSPITAL_COMMUNITY): Payer: Self-pay | Admitting: Emergency Medicine

## 2014-08-03 ENCOUNTER — Other Ambulatory Visit (INDEPENDENT_AMBULATORY_CARE_PROVIDER_SITE_OTHER): Payer: Self-pay | Admitting: Internal Medicine

## 2014-08-03 ENCOUNTER — Telehealth (INDEPENDENT_AMBULATORY_CARE_PROVIDER_SITE_OTHER): Payer: Self-pay | Admitting: Internal Medicine

## 2014-08-03 ENCOUNTER — Encounter (INDEPENDENT_AMBULATORY_CARE_PROVIDER_SITE_OTHER): Payer: Self-pay | Admitting: Addiction (Substance Use Disorder)

## 2014-08-03 DIAGNOSIS — F419 Anxiety disorder, unspecified: Secondary | ICD-10-CM

## 2014-08-03 NOTE — Progress Notes (Signed)
Individual Therapy Pre Screening Form  Date/Time: 08/03/2014, 11:39 AM  Interviewer: Lorain Childes    Patient Identification  Patient Name: Deborah Kirk      Patient SSN: [id number]    Patient Phone Number(s):  [phone] (home)  , additional phone number:     Age:39 y.o.   DOB: 13-Nov-1975  Gender: female    Registration Related Information:   Planned Payment Method: Insurance    Referral Required: No  Have you confirmed your mental health benefits with your insurance? Yes    Referral Source:   Can leave message?:  Yes    Why are you choosing to seek therapy at this time?: marriage problems, life problems, anxiety     Current support systems in life: married     Are you seeking long term therapy/more than 12 sessions:  No  Are you looking for court ordered or court recommended therapy? No  Are you wanting any type of evaluation/assessment completed? No  REFER OUT IF NECESSARY    Do you currently drink alcohol? Yes very little    Do you currently or in the recent past use any non prescribed or illegal substances?  no  Have you been in a program or seen a provider for substance abuse treatment including alcohol? no  REFER TO [facility] IF APPROPRIATE    Do you ever worry about your memory?No  Have your family/friends ever expressed concern about your memory? no  REFER OUT IF APPROPRIATE     Do you currently have thoughts of hurting yourself?No  Have you had these thoughts in the past? No    Are you currently on any medication? Yes  What are they? See chart     Who is the prescribing doctor? Dr. 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    No    Time to call back: after 10am  Referred out to: n/a  Therapist Assigned: dr. Will Bonnet  Appointment date and time

## 2014-08-03 NOTE — Telephone Encounter (Signed)
Pt. Was wondering if Dr. Jerolyn Center could write a script for pt. to see a psychologist for first time eval (pt. Didn't give me a specific diagnosis).     Psychologist is [place] (with [facility])  [phone]  F. [phone]    [facility]. Just needs [place] to send them an order so then she can schedule her appt.     If you have questions for her you can reach her at [phone]    Thanks, MCL

## 2014-08-03 NOTE — Telephone Encounter (Signed)
Done

## 2014-08-03 NOTE — Telephone Encounter (Signed)
Please advise, thank you.

## 2014-08-21 ENCOUNTER — Encounter (INDEPENDENT_AMBULATORY_CARE_PROVIDER_SITE_OTHER): Payer: Self-pay | Admitting: Internal Medicine

## 2014-08-21 ENCOUNTER — Ambulatory Visit (INDEPENDENT_AMBULATORY_CARE_PROVIDER_SITE_OTHER): Payer: No Typology Code available for payment source | Admitting: Internal Medicine

## 2014-08-21 VITALS — BP 111/60 | HR 80 | Temp 98.9°F | Ht 64.45 in | Wt 137.6 lb

## 2014-08-21 DIAGNOSIS — F900 Attention-deficit hyperactivity disorder, predominantly inattentive type: Secondary | ICD-10-CM

## 2014-08-21 DIAGNOSIS — F419 Anxiety disorder, unspecified: Secondary | ICD-10-CM | POA: Insufficient documentation

## 2014-08-21 DIAGNOSIS — F5101 Primary insomnia: Secondary | ICD-10-CM | POA: Insufficient documentation

## 2014-08-21 DIAGNOSIS — F988 Other specified behavioral and emotional disorders with onset usually occurring in childhood and adolescence: Secondary | ICD-10-CM | POA: Insufficient documentation

## 2014-08-21 LAB — TSH: TSH: 0.87 u[IU]/mL (ref 0.35–4.94)

## 2014-08-21 LAB — T4, FREE: T4 Free: 0.89 ng/dL (ref 0.70–1.48)

## 2014-08-21 MED ORDER — BUPROPION HCL 100 MG PO TABS
100.0000 mg | ORAL_TABLET | Freq: Two times a day (BID) | ORAL | Status: DC
Start: 2014-08-21 — End: 2015-02-01

## 2014-08-21 MED ORDER — FLUCONAZOLE 150 MG PO TABS
ORAL_TABLET | ORAL | Status: DC
Start: 2014-08-21 — End: 2014-09-21

## 2014-08-21 MED ORDER — METHYLPHENIDATE HCL ER (OSM) 18 MG PO TBCR
18.0000 mg | EXTENDED_RELEASE_TABLET | Freq: Every day | ORAL | Status: DC
Start: 2014-08-21 — End: 2014-08-21

## 2014-08-21 MED ORDER — METHYLPHENIDATE HCL ER (OSM) 18 MG PO TBCR
18.0000 mg | EXTENDED_RELEASE_TABLET | Freq: Every day | ORAL | Status: DC
Start: 2014-08-21 — End: 2015-02-01

## 2014-08-21 MED ORDER — ESZOPICLONE 1 MG PO TABS
1.0000 mg | ORAL_TABLET | Freq: Every evening | ORAL | Status: DC
Start: 2014-08-21 — End: 2017-01-29

## 2014-08-21 NOTE — Progress Notes (Signed)
Have you seen any new specialist outside of [facility] since your last office visit?    No

## 2014-08-21 NOTE — Progress Notes (Signed)
Subjective:       Patient ID: Deborah Kirk is a 39 y.o. 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     HPI  Insomnia - she take Lunesta 1 mg twice weekly  Anxiety - she takes Wellbutrin 100 mg BID with improvement. She is on waiting list to see counselor.  ADD - she is on Concerta ER 18 mg daily. No palpitations. weight loss.    The following portions of the patient's history were reviewed and updated as appropriate: allergies, current medications and problem list.    Review of Systems   Constitutional:        She exercises regularly.  Last Pap 3 years ago. She has vaginal yeast infections in the summer.  Alopecia has resolved.      Endocrine:        She has FH of thyroid disease. She was noted to have multi nodular goiter in the past.           Objective:    Physical Exam  BP 111/60 mmHg  Pulse 80  Temp(Src) 98.9 F (37.2 C) (Oral)  Ht 1.637 m (5' 4.45")  Wt 62.415 kg (137 lb 9.6 oz)  BMI 23.29 kg/m2  LMP 08/01/2014 (Approximate)  Constitutional:  Alert, oriented X 3. NAD  Head    Atraumatic, normocephalic  Eye:   Conjunctival - pink  Chest:   Unlabored respirations. Lungs clear to auscultation, good breath sounds  Cardiac:  Normal S1&S2 without murmurs. No edema.  Skin:   Warm, dry, no rashes in visible areas  Psych:   Normal affect, thought content        Assessment:       1. Anxiety  TSH    T4, free   2. Primary insomnia     3. ADD (attention deficit disorder)              Plan:      Procedures    Continue current medications with routine laboratory monitoring, annual influenza vaccine, seat belts, ,regular exercise,and healthy diet.  Follow up with counselor. See gyn.

## 2014-09-21 ENCOUNTER — Ambulatory Visit (INDEPENDENT_AMBULATORY_CARE_PROVIDER_SITE_OTHER)
Payer: No Typology Code available for payment source | Admitting: Student in an Organized Health Care Education/Training Program

## 2014-09-21 ENCOUNTER — Encounter (INDEPENDENT_AMBULATORY_CARE_PROVIDER_SITE_OTHER): Payer: Self-pay | Admitting: Student in an Organized Health Care Education/Training Program

## 2014-09-21 VITALS — BP 103/60 | HR 79 | Temp 97.8°F | Ht 64.5 in | Wt 140.0 lb

## 2014-09-21 DIAGNOSIS — R102 Pelvic and perineal pain: Secondary | ICD-10-CM

## 2014-09-21 DIAGNOSIS — L821 Other seborrheic keratosis: Secondary | ICD-10-CM

## 2014-09-21 DIAGNOSIS — R112 Nausea with vomiting, unspecified: Secondary | ICD-10-CM

## 2014-09-21 MED ORDER — METRONIDAZOLE 500 MG PO TABS
500.0000 mg | ORAL_TABLET | Freq: Three times a day (TID) | ORAL | Status: AC
Start: 2014-09-21 — End: 2014-09-28

## 2014-09-21 NOTE — Progress Notes (Signed)
Chief Complaint   Patient presents with   . Pelvic Pain   . Skin Problem     dark spot on left cheek         Deborah Kirk is a 38 y.o. female who presents for evaluation of  Above problem   She C/O pelvic Pain that started 3-4  days ago, Located in the pelvic Quadrant, Radiating to the back  Sharp pain, Continuous/ cramps, associated with Nausea and vomiting.  No  C/O Diarrhea/Constipation,  No Blood/Melena in the stool  Not Related to Food, No fever or chills  It happened x 2 in the past and did well on antibiotic, cultures were not done then, she was treated for non STD infections per pt   She is married and have low risk for STd      She c/o atypical mole over L cheek        Past Medical History   Diagnosis Date   . Attention deficit hyperactivity disorder (ADHD)    . Vaginal yeast infection      chronic   . Anxiety       Past Surgical History   Procedure Laterality Date   . Cesarean section     . Wisdom tooth extraction          (Not in a hospital admission)  Current/Home Medications    BUPROPION (WELLBUTRIN) 100 MG TABLET    Take 1 tablet (100 mg total) by mouth 2 (two) times daily.    ESZOPICLONE (LUNESTA) 1 MG TABLET    Take 1 tablet (1 mg total) by mouth nightly. Take immediately before bedtime    METHYLPHENIDATE (CONCERTA) 18 MG CR TABLET    Take 1 tablet (18 mg total) by mouth daily.    MULTIPLE VITAMIN (MULTIVITAMIN) CAPSULE    Take 1 capsule by mouth daily.     Allergies   Allergen Reactions   . Keflex [Cephalexin] Hives   . Nitrofurantoin      AKA Macrobid   . Percocet [Oxycodone-Acetaminophen] Hives   . Sulfa Antibiotics Hives      History   Substance Use Topics   . Smoking status: Never Smoker    . Smokeless tobacco: Never Used   . Alcohol Use: 0.6 oz/week     0 Standard drinks or equivalent, 1 Glasses of wine per week      Family History   Problem Relation Age of Onset   . Sleep disorder Mother    . Anxiety disorder Mother    . Hepatitis Mother    . Alzheimer's disease Father    . Thyroid  disease Sister           Review of Systems - General ROS: negative for - chills, fatigue, fever or malaise  Hematological and Lymphatic ROS: negative for - fatigue, jaundice or weight loss  Gastrointestinal ROS: no , change in bowel habits, or black or bloody stools  positive for - pelvic pain   Genito-Urinary ROS: no dysuria, trouble voiding, or hematuria   +skin lesion   Other ROS were reviewed and were neg       Filed Vitals:    09/21/14 1021   BP: 103/60   Pulse: 79   Temp: 97.8 F (36.6 C)   SpO2: 98%       Physical Examination: General appearance - alert, well appearing, and in no distress and oriented to person, place, and time  Eyes - pupils equal and reactive, extraocular eye movements intact,  no icterus  Mouth - mucous membranes moist, pharynx normal without lesions, tonsils normal and no thrush  Chest - clear to auscultation, no wheezes, rales or rhonchi, symmetric air entry  Heart - normal rate, regular rhythm, normal S1, S2, no murmurs, rubs, clicks or gallops  Abdomen - soft, nondistended, no masses or organomegaly  tenderness noted over the pelvic/suprapubic area   no rebound tenderness noted  Pelvic : + cheesy vaginal d/c, no CMT   Extremities - peripheral pulses normal, no pedal edema, no clubbing or cyanosis  Skin :stuck on appearance round, brown papule over the L cheek       A/P    1. Seborrheic keratoses  Ambulatory referral to Dermatology   2. Pelvic pain  US Pelvis Complete    metroNIDAZOLE (FLAGYL) 500 MG tablet    SureSwab(TM) Plus    CANCELED: Miscellaneous Test               Risk & Benefits of the new medication(s) were explained to the patient who verbalized understanding & agreed to the treatment plan    Phylliss Bob MD

## 2014-09-24 LAB — MISCELLANEOUS QUEST TEST

## 2014-10-02 ENCOUNTER — Ambulatory Visit
Admission: RE | Admit: 2014-10-02 | Discharge: 2014-10-02 | Disposition: A | Discharge status: Home / Self Care | Payer: No Typology Code available for payment source | Source: Ambulatory Visit

## 2014-10-02 DIAGNOSIS — R938 Abnormal findings on diagnostic imaging of other specified body structures: Secondary | ICD-10-CM | POA: Insufficient documentation

## 2014-10-02 DIAGNOSIS — R102 Pelvic and perineal pain: Secondary | ICD-10-CM | POA: Insufficient documentation

## 2014-10-02 DIAGNOSIS — N8329 Other ovarian cysts: Secondary | ICD-10-CM | POA: Insufficient documentation

## 2014-10-03 ENCOUNTER — Telehealth (INDEPENDENT_AMBULATORY_CARE_PROVIDER_SITE_OTHER): Payer: Self-pay | Admitting: Student in an Organized Health Care Education/Training Program

## 2014-10-03 NOTE — Telephone Encounter (Signed)
Patient wants to know does she need to follow up with a GYN    Pt phone [phone]

## 2014-10-04 ENCOUNTER — Encounter (INDEPENDENT_AMBULATORY_CARE_PROVIDER_SITE_OTHER): Payer: Self-pay | Admitting: Student in an Organized Health Care Education/Training Program

## 2014-10-04 ENCOUNTER — Ambulatory Visit (FREE_STANDING_LABORATORY_FACILITY)
Payer: No Typology Code available for payment source | Admitting: Student in an Organized Health Care Education/Training Program

## 2014-10-04 VITALS — BP 101/69 | HR 66 | Temp 98.0°F | Ht 64.5 in | Wt 140.0 lb

## 2014-10-04 DIAGNOSIS — N83202 Unspecified ovarian cyst, left side: Secondary | ICD-10-CM

## 2014-10-04 DIAGNOSIS — N8320 Unspecified ovarian cysts: Secondary | ICD-10-CM

## 2014-10-04 DIAGNOSIS — Z01419 Encounter for gynecological examination (general) (routine) without abnormal findings: Secondary | ICD-10-CM

## 2014-10-04 NOTE — Telephone Encounter (Signed)
Please advise.    Voicemail left for pt x 2 by Dr. Salina April.

## 2014-10-06 NOTE — Progress Notes (Signed)
Subjective:       Patient ID: Deborah Kirk is a 39 y.o. 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   Her pelvic Pain is better Located in the pelvic area , Radiating to the back  Pelvic ultrasound US Pelvic W Transvaginal    10/02/2014    1. Nonspecific endometrial thickening. 2. 16mm right ovarian complex cyst suggestive of a partially hemorrhagic follicular cyst.     Blair Promise, MD  10/02/2014 12:18 PM       She sees dr Mathews/PCP for   Insomnia - she take Lunesta 1 mg twice weekly  Anxiety - she takes Wellbutrin 100 mg BID with improvement  ADD - she is on Concerta ER 18 mg daily    Past Surgical History   Procedure Laterality Date   . Cesarean section     . Wisdom tooth extraction       Past Medical History   Diagnosis Date   . Attention deficit hyperactivity disorder (ADHD)    . Vaginal yeast infection      chronic   . Anxiety      Allergies   Allergen Reactions   . Keflex [Cephalexin] Hives   . Nitrofurantoin      AKA Macrobid   . Percocet [Oxycodone-Acetaminophen] Hives   . Sulfa Antibiotics Hives     Medications: Concerta 188 mg, Wellbutrin 100 mg BID, Melantion 1 mg twice per week.  History   Substance Use Topics   . Smoking status: Never Smoker    . Smokeless tobacco: Not on file   . Alcohol Use: 0.0 oz/week     0 Not specified per week     Family History   Problem Relation Age of Onset   . Sleep disorder Mother    . Anxiety disorder Mother    . Hepatitis Mother    . Alzheimer's disease Father    . Thyroid disease Sister        The following portions of the patient's history were reviewed and updated as appropriate: allergies, current medications, past family history, past medical history, past social history, past surgical history and problem list.    Review of Systems   Constitutional:        She goes to gym 4 times per week.   HENT:        TMJ - resolved after caps to improve bite.   Eyes: Negative.    Respiratory: Negative.  Negative for cough and  choking.    Cardiovascular: Negative.    Gastrointestinal: Negative.  Negative for abdominal distention.   Genitourinary: Negative.         Pap smear - 2 years ago.   Musculoskeletal: Positive for back pain. Negative for arthralgias.        She does stretching exercises   Skin:        Alopecia due to telogen effluvium has resolved. She had seen several endocrinologist - multinodular goiter with normal TFTs.   Neurological: Negative.  Negative for light-headedness and headaches.   Hematological: Negative.  Does not bruise/bleed easily.   Psychiatric/Behavioral: Positive for sleep disturbance and decreased concentration.           Objective:    Physical Exam  Filed Vitals:    10/04/14 1136   BP: 101/69   Pulse: 66   Temp: 98 F (36.7 C)   Height: 1.638 m (5' 4.5")   Weight: 63.504 kg (  140 lb)     Constitutional:  Alert, oriented X 3. NAD  Head    Atraumatic, normocephalic  Eye:   Conjunctival - pink  EENT   Oropharynx is clear, good dentition, lips without lesions  Neck:   Trachea is midline, no thyromegaly  Lymph:  No cervical or supraclavicular adenopathy  Chest:   Unlabored respirations. Lungs clear to auscultation, good breath      sounds  Cardiac:  Normal S1&S2 without murmurs. No edema.  Abdomen:  Soft, nontender, no palpable masses, liver or spleen  Back   No CVA or spinous tenderness  Skin:   Warm, dry, no rashes in visible areas  Psych:   Normal affect, thought content  Pelvic : NAFG, some pain with bimanual exam on the R , no CMT, normal colored vaginal D/c         Assessment:      1. Gynecologic exam normal  Pap Smear, Thin Prep Method   2. Cyst of left ovary  US Pelvis Complete            Plan:      Pelvic U/S discussed with Pt, she will repeat in 3 months   Pap smear checked       Continue current medications, annual influenza vaccine, seat belts, regular dental visits, regular exercise,and healthy diet.

## 2014-10-13 LAB — PAP SMEAR, THIN PREP METHOD

## 2014-10-18 ENCOUNTER — Encounter (INDEPENDENT_AMBULATORY_CARE_PROVIDER_SITE_OTHER): Payer: Self-pay | Admitting: Internal Medicine

## 2014-10-18 ENCOUNTER — Ambulatory Visit (INDEPENDENT_AMBULATORY_CARE_PROVIDER_SITE_OTHER): Payer: No Typology Code available for payment source | Admitting: Internal Medicine

## 2014-10-18 ENCOUNTER — Telehealth (INDEPENDENT_AMBULATORY_CARE_PROVIDER_SITE_OTHER): Payer: Self-pay

## 2014-10-18 VITALS — BP 110/73 | HR 73 | Temp 97.8°F | Ht 64.5 in | Wt 143.0 lb

## 2014-10-18 DIAGNOSIS — S39012A Strain of muscle, fascia and tendon of lower back, initial encounter: Secondary | ICD-10-CM

## 2014-10-18 MED ORDER — ACETAMINOPHEN-CODEINE 300-30 MG PO TABS
1.0000 | ORAL_TABLET | Freq: Four times a day (QID) | ORAL | Status: DC | PRN
Start: 2014-10-18 — End: 2015-02-01

## 2014-10-18 MED ORDER — METHOCARBAMOL 750 MG PO TABS
750.0000 mg | ORAL_TABLET | Freq: Three times a day (TID) | ORAL | Status: DC
Start: 2014-10-18 — End: 2015-02-01

## 2014-10-18 NOTE — Progress Notes (Signed)
Have you seen any specialists outside [facility]?   NO

## 2014-10-18 NOTE — Patient Instructions (Addendum)
Welcome to our practice. If you need to contact me please call [phone] or e-mail me via MY chart.    Rest, avoid lifting heavy objects, no work out for at least one week.  Take Robaxin one tablet every 8 hours as needed. May cause drowsiness, avoid driving or operating machinery.  If you still have pain then take Tylenol #3 one tablet every 6 to 8 hours as needed for pain. It causes drowsiness, please make sure you do not drive while on this medication.  Apply warm compresses over the area every 4 to 6 hours for 5 to 10 minutes.  Do lower back exercises as listed below. Do each exercises twice daily for 10 reps.  Call us back in one week if not better or if you feel worse.      Back Sprain Or Strain    You have injured the muscles (strain) or ligaments (sprain) around the spine. This may occur after a sudden forceful twisting or bending force (such as in a car accident), after a simple awkward movement, or after lifting something heavy with poor body positioning. In either case, muscle spasm is often present and adds to the pain.  A back sprain or muscle strain usually gets better in 1-2 weeks. Unless you had a forceful physical injury (for example, a car accident or fall), X-rays are usually not ordered for the initial evaluation of a back sprain or strain. If pain continues and does not respond to medical treatment, X-rays and other tests may be performed at a later time.  Home care  The following guidelines will help you care for your injury at home:  1. You may need to stay in bed the first few days. But, as soon as possible, begin sitting or walking to avoid problems with prolonged bed rest (muscle weakness, worsening back stiffness and pain, blood clots in the legs).  2. When in bed, try to find a position of comfort. A firm mattress is best. Try lying flat on your back with pillows under your knees. You can also try lying on your side with your knees bent up towards your chest and a pillow between your  knees.  3. Avoid prolonged sitting. This puts more stress on the lower back than standing or walking.  4. During the first two days after injury, apply anice packto the painful area for 20 minutes every 2-4 hours. This will reduce swelling and pain.Heat(hot shower, hot bath or heating pad) works well for muscle spasm. You can start with ice, then switch to heat after two days. Some patients feel best alternating ice and heat treatments. Use the one method that feels the best to you.  5. You may use acetaminophen or ibuprofen to control pain, unless another pain medicine was prescribed. If you have chronic liver or kidney disease or ever had a stomach ulcer or GI bleeding, talk with your doctor before using these medicines.  6. Be aware of safe lifting methods and do not lift anything over 15 pounds until all the pain is gone.  Follow-up care  Follow up with your doctor or this facility as advised. Physical therapy or further tests may be needed if symptoms worsen.  If you had X-rays today, they didn't show any broken bones, breaks, or fractures. Sometimes fractures don't show up on the first X-ray. Bruises and sprains can sometimes hurt as much as a fracture. These injuries can take time to heal completely. If your symptoms don't improve  or they get worse, talk with your doctor. You may need a repeat X-ray.  When to seek medical care  Get prompt medical attention if any of the following occur:   Pain becomes worse or spreads to your arms or legs   Weakness or numbness in one or both arms or legs   Loss of bowel or bladder control   Numbness in the groin or genital area   [address], [place], [place] [address]. All rights reserved. This information is not intended as a substitute for professional medical care. Always follow your healthcare professional's instructions.      Back Exercises: Lower Back Stretch                  To start, sit in a chair with your feet flat on  the floor. Shift your weight slightly forward to avoid rounding your back. Relax, and keep your ears, shoulders, and hips aligned.   Sit with your feet well apart.   Bend forward and touch the floor with the backs of your hands. Relax and let your body drop.   Hold for20seconds. Return to starting position.   Repeat2times.   [address], [place], [place] [address]. All rights reserved. This information is not intended as a substitute for professional medical care. Always follow your healthcare professional's instructions.

## 2014-10-18 NOTE — Telephone Encounter (Signed)
Pt would like Methocarbamol called into [facility] [phone]. Rx called in. Patient aware

## 2014-10-18 NOTE — Progress Notes (Addendum)
Subjective:       Patient ID: Deborah Kirk is a 39 y.o. female who presents for acute L > R low back pain    HPI:  Patient was recently seen by Dr. Salina April in April 2016 for annual pelvic exam, where she c/o pelvic pressure with associated N/V.  TVUS was ordered, and showed 16mm right ovarian complex cyst suggestive of a partially hemorrhagic follicular cyst. No h/o ovarian cysts prior to this.  Patient noted that after the TVUS procedure, the pelvic pressure & N/V went away, however that's when the low back pain started.  Last wed she saw chiropractor for this, but the pain wasn't that bad at the time.  The past 3 days the pain has significantly worsened, and she'd had to take flexril for the last 3 nights, after which she sleeps through the night, but still wakes with pain.  She's also been taking ibuprofen 800mg  TID the last 3 days.  The pain is worse with flexion (squatting, bending over to tie showed): 9/10.  Better with standing: bearable.  She works out daily and has not been able work out the past 2 days because of severe pain.  No recent h/o trauma or falls.  No changes in bowel or bladder function.  No numbness/tingling/parasthesias.      She's also concerned that she's gained 3 lbs since then.      Wt Readings from Last 10 Encounters:   10/18/14 0934 64.864 kg (143 lb)   10/04/14 1136 63.504 kg (140 lb)   09/21/14 1021 63.504 kg (140 lb)   08/21/14 1305 62.415 kg (137 lb 9.6 oz)   02/27/14 1317 63.685 kg (140 lb 6.4 oz)     She's also concerned about possible malignancy.  She has no family h/o GYN malignancies or ovarian cysts.  No fevers, chills, sweats, weight loss, loss of appetite.    Menstrual hx:  She is normally regular with heavy flow.  She had a heavier & more painful than usual period in april.  Period this month started may 15th and is really light relatively.        Review of Systems   Constitutional: Positive for activity change. Negative for fever, chills and appetite change.    Respiratory: Negative for chest tightness and shortness of breath.    Cardiovascular: Negative for chest pain and palpitations.   Gastrointestinal: Negative for nausea, vomiting, abdominal pain and blood in stool.   Genitourinary: Negative for dysuria, hematuria, vaginal discharge, difficulty urinating and vaginal pain.   Musculoskeletal: Positive for back pain. Negative for neck pain and neck stiffness.   Neurological: Negative for dizziness, weakness and numbness.   Hematological: Negative for adenopathy.           Objective:   BP 110/73 mmHg  Pulse 73  Temp(Src) 97.8 F (36.6 C) (Oral)  Ht 1.638 m (5' 4.5")  Wt 64.864 kg (143 lb)  BMI 24.18 kg/m2  LMP 10/15/2014    Physical Exam   Constitutional: She appears well-developed and well-nourished. No distress.   Neck: Normal range of motion. Neck supple.   Cardiovascular: Normal rate, regular rhythm, normal heart sounds and intact distal pulses.    Pulmonary/Chest: Effort normal and breath sounds normal. No respiratory distress. She has no wheezes. She has no rales.   Abdominal: Soft. She exhibits no distension. There is no tenderness.   Musculoskeletal: Normal range of motion. She exhibits no edema or tenderness.   No spinal or paraspinal TTP.  Back muscles w/o TTP.    Patient reports pain in mid back with lumbar flexion & extension, localized to L lower back.    With L/R bending at waist she reports pain in L lower back that radiates down the lateral aspect of thigh and ends around the knee.    With twisting to the L, she reports pain in L lower back.  No pain with R ward rotation of lumbar spine.    L lower back pain also elicited with external rotation of L hip.    No pain with passive leg raises b/l.     Lymphadenopathy:     She has no cervical adenopathy.   Neurological: She has normal strength. No sensory deficit. She exhibits normal muscle tone. Coordination and gait normal.   Reflex Scores:       Patellar reflexes are 2+ on the right side and 2+ on  the left side.       Achilles reflexes are 2+ on the right side and 2+ on the left side.  Skin: Skin is warm and dry. No rash noted. She is not diaphoretic.           Assessment:       1. Back strain, initial encounter           Plan:      Procedures: none    Back Strain: likely 2/2 strenuous work outs, especially given pain elicited with muscle engagement.  no radiculopathy, change in bowel or bladder function.  - symptomatic relief with robaxin & tyl #3 PRN.  Patient advised not to drive after taking these meds.  - warm compresses & rest  - if unresolved in the next couple of weeks, will consider imaging studies    Ovarian cyst  - repeat imaging as recommended by Ob/gyn for follow up       Signed by,  Garnetta Buddy, MD  Internal Medicine, PGY-1    Assessment & Plan discussed with Dr. Rudolpho Sevin.          Attending notes:      Patient seen and examined.  Chart reviewed and agree with Dr. Juanda Chance  A/P.  Current and alternate treatment plans discussed with patient. Patient understands and agrees with current plan. Written instruction was reviewed with the patient and a copy was given to the patient. Common side affect of medications were discussed with the  patient and all questions were addressed.    Wasel S. Rudolpho Sevin, D.O.    [facility]  [address]  [address]   [place], [place] [address]  Phone: [phone]  Fax:     [phone]

## 2014-10-19 ENCOUNTER — Telehealth (INDEPENDENT_AMBULATORY_CARE_PROVIDER_SITE_OTHER): Payer: Self-pay

## 2014-10-19 NOTE — Telephone Encounter (Signed)
Spoke to patient to follow up on acute back pain with Dr.Akbary. Patient states she is feeling slight pain relief with prescriptions. Advised patient to return to office if having worsening pain or keeps having persistent pain. States verba; understanding.

## 2014-10-23 ENCOUNTER — Ambulatory Visit (INDEPENDENT_AMBULATORY_CARE_PROVIDER_SITE_OTHER): Payer: No Typology Code available for payment source | Admitting: Specialist

## 2014-10-23 ENCOUNTER — Encounter (INDEPENDENT_AMBULATORY_CARE_PROVIDER_SITE_OTHER): Payer: Self-pay | Admitting: Specialist

## 2014-10-23 VITALS — BP 110/75 | HR 70 | Ht 64.5 in | Wt 139.2 lb

## 2014-10-23 DIAGNOSIS — N8320 Unspecified ovarian cysts: Secondary | ICD-10-CM

## 2014-10-23 DIAGNOSIS — N83201 Unspecified ovarian cyst, right side: Secondary | ICD-10-CM

## 2014-10-23 DIAGNOSIS — N921 Excessive and frequent menstruation with irregular cycle: Secondary | ICD-10-CM

## 2014-10-23 DIAGNOSIS — N926 Irregular menstruation, unspecified: Secondary | ICD-10-CM

## 2014-10-23 DIAGNOSIS — Z139 Encounter for screening, unspecified: Secondary | ICD-10-CM

## 2014-10-23 LAB — HCG QUANTITATIVE: hCG, Quant.: <1.2 (ref 0.0–4.9)

## 2014-10-23 MED ORDER — TRANEXAMIC ACID 650 MG PO TABS
2.0000 | ORAL_TABLET | Freq: Three times a day (TID) | ORAL | Status: DC | PRN
Start: 2014-10-23 — End: 2016-11-03

## 2014-10-23 NOTE — Progress Notes (Signed)
Subjective:       Patient ID: Deborah Kirk is a 39 y.o. female.    HPI She is a new patient. She goes by "Deborah Kirk"  She presents to discuss a hemorrhagic ovarian cyst on the right with pain which has resolved, however she has lower back pain and hip pain.  This pain is superficial and not deep inside and she has been seeing a chiropractor about it.    Her menses are regular and she uses double protection, vasectomy and condoms, however her menses was late when she had the ovarian cyst and then the next one was very light.  She has a hx of hashimoto's but her thyroid labs have been normal.    She exercises by doing boot camp.  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  The following portions of the patient's history were reviewed and updated as appropriate: allergies, current medications, past family history, past medical history, past social history, past surgical history and problem list.    Review of Systems   Constitutional: Negative.    Respiratory: Negative.    Cardiovascular: Negative.    Gastrointestinal: Negative.    Genitourinary: Negative.    Musculoskeletal: Negative.    Skin: Negative.    Neurological: Negative.    Hematological: Negative.    Psychiatric/Behavioral: Negative.            Objective:    Physical Exam  I reviewed her ultrasound and discussed the hemorrhagic ovarian cyst and how it can cause pain and the fact that it can cause changes in her menses temporarily.  I did recommend an AMH to assess whether she is in the menopausal transition.  I also gave her an order for a FU sono 6 wks later to ensure the cyst is resolved.  Back and pain pain is most likely musculoskeletal and I recommend she get a second opinion for it from a physical therapist as well.  Heavy menses- i did prescribe lysteda for heavy menses and discussed side effects.  I reviewed her pap from this year and the HPV was not done but the pap was normal.      Assessment:       Hemorrhagic right ovarian  cyst-symptoms   Musculoskeletal back pain & hip pain  Heavy menses      Plan:      Procedures  1. Hemorrhagic ovarian cyst-fu sono 6 wks, order given  2. musculoskel back and hip pain-physical therapy referral  3. Heavy menses-lysteda  4. Irregular  menses-check AMH to ascertain if in the menopausal transition

## 2014-10-26 LAB — ANTIMULLERIAN HORMONE (AMH): Anti Mullerian Hormone (AMH): 4.2 ng/mL (ref 0.9–9.5)

## 2014-12-21 ENCOUNTER — Encounter (INDEPENDENT_AMBULATORY_CARE_PROVIDER_SITE_OTHER): Payer: Self-pay | Admitting: Family Medicine

## 2014-12-21 ENCOUNTER — Ambulatory Visit (FREE_STANDING_LABORATORY_FACILITY): Payer: No Typology Code available for payment source | Admitting: Family Medicine

## 2014-12-21 VITALS — BP 98/70 | HR 71 | Temp 97.9°F | Resp 17 | Ht 64.0 in | Wt 129.6 lb

## 2014-12-21 DIAGNOSIS — J0110 Acute frontal sinusitis, unspecified: Secondary | ICD-10-CM

## 2014-12-21 DIAGNOSIS — J029 Acute pharyngitis, unspecified: Secondary | ICD-10-CM

## 2014-12-21 LAB — POCT RAPID STREP A: Rapid Strep A Screen POCT: NEGATIVE

## 2014-12-21 MED ORDER — AMOXICILLIN-POT CLAVULANATE 875-125 MG PO TABS
1.0000 | ORAL_TABLET | Freq: Two times a day (BID) | ORAL | Status: AC
Start: 2014-12-21 — End: 2014-12-31

## 2014-12-21 NOTE — Progress Notes (Signed)
Subjective:      Patient ID: Deborah Kirk  is a 39 y.o.  female.     Chief Complaint   Patient presents with   . Nausea        Sinusitis  This is a new problem. The current episode started in the past 7 days. The problem has been gradually worsening since onset. There has been no fever. The pain is mild. Associated symptoms include congestion, sinus pressure and a sore throat. Pertinent negatives include no chills, coughing, ear pain, headaches, hoarse voice, shortness of breath, sneezing or swollen glands. (Fatigue) Treatments tried: Motrin. The treatment provided moderate relief.                Revi  ew of Systems   Constitutional: Negative for chills.   HENT: Positive for congestion, sinus pressure and sore throat. Negative for ear pain, hoarse voice and sneezing.    Respiratory: Negative for cough and shortness of breath.    Neurological: Negative for headaches.           Objective:     BP 98/70 mmHg  Pulse 71  Temp(Src) 97.9 F (36.6 C) (Oral)  Resp 17  Ht 1.626 m (5\' 4" )  Wt 58.786 kg (129 lb 9.6 oz)  BMI 22.23 kg/m2  SpO2 98%  LMP 12/16/2014 (Exact Date)    Physical Exam   Constitutional: She is well-developed, well-nourished, and in no distress.   HENT:   Nose: Nose normal.   Mouth/Throat: Oropharynx is clear and moist. No oropharyngeal exudate.   Bilateral TMs full but not red     Eyes: Conjunctivae are normal. Right eye exhibits no discharge. Left eye exhibits no discharge.   Neck: Normal range of motion. Neck supple. No thyromegaly present.   Cardiovascular: Normal rate, regular rhythm and normal heart sounds.    No murmur heard.  Pulmonary/Chest: Effort normal and breath sounds normal. No respiratory distress.   Abdominal: Soft. Bowel sounds are normal. She exhibits no distension.   Lymphadenopathy:     She has no cervical adenopathy.   Neurological: She is alert. Gait normal.   Skin:   No visible rash       Vitals reviewed.     Assessment and Plan:     1. Acute frontal sinusitis,  recurrence not specified  Advised with limited time course she should wait and see if it gets better on its own.  If no improvement after 7-10 days of illness, she can take antibiotics.  Prescription for Augmentin was given.  2. Sore throat  - Throat culture  - POCT rapid strep A       OTC analgesics and decongestants as needed - also push fluids and rest.   Call or return to clinic prn if these symptoms worsen or fail to improve as anticipated.    Risk & Benefits of any new medication(s) were explained to the patient who verbalized understanding & agreed to the treatment plan.     Call with updates/questions/concerns.    Ellery Plunk, M.D.

## 2014-12-21 NOTE — Progress Notes (Signed)
Has the patient sought any care outside of the [facility]? YES/NO    -Refer to care team-   Or  List Specialists:      Yes    Dr. Sandria Manly

## 2014-12-22 ENCOUNTER — Telehealth (INDEPENDENT_AMBULATORY_CARE_PROVIDER_SITE_OTHER): Payer: Self-pay | Admitting: Family Medicine

## 2014-12-22 NOTE — Telephone Encounter (Signed)
Called  To follow up acute Care. Patient reported feels much better today, no pain nor cough, patient  appreciated to follow with Patient care, the boys were fine. Will follow-up with patient when throat culture results.

## 2014-12-25 ENCOUNTER — Encounter (INDEPENDENT_AMBULATORY_CARE_PROVIDER_SITE_OTHER): Payer: Self-pay | Admitting: Family Medicine

## 2015-01-10 ENCOUNTER — Emergency Department (HOSPITAL_COMMUNITY)
Admission: EM | Admit: 2015-01-10 | Discharge: 2015-01-10 | Disposition: A | Discharge status: Home / Self Care | Payer: Self-pay | Attending: Emergency Medicine | Admitting: Emergency Medicine

## 2015-01-10 ENCOUNTER — Encounter (HOSPITAL_COMMUNITY): Payer: Self-pay | Admitting: Emergency Medicine

## 2015-01-10 DIAGNOSIS — Z8659 Personal history of other mental and behavioral disorders: Secondary | ICD-10-CM | POA: Insufficient documentation

## 2015-01-10 DIAGNOSIS — L02212 Cutaneous abscess of back [any part, except buttock]: Secondary | ICD-10-CM | POA: Insufficient documentation

## 2015-01-10 DIAGNOSIS — L0291 Cutaneous abscess, unspecified: Secondary | ICD-10-CM

## 2015-01-10 DIAGNOSIS — Z87440 Personal history of urinary (tract) infections: Secondary | ICD-10-CM | POA: Insufficient documentation

## 2015-01-10 DIAGNOSIS — Z720 Tobacco use: Secondary | ICD-10-CM | POA: Insufficient documentation

## 2015-01-10 DIAGNOSIS — Z87448 Personal history of other diseases of urinary system: Secondary | ICD-10-CM | POA: Insufficient documentation

## 2015-01-10 DIAGNOSIS — Z22330 Carrier of Group B streptococcus: Secondary | ICD-10-CM | POA: Insufficient documentation

## 2015-01-10 MED ORDER — LIDOCAINE-EPINEPHRINE-TETRACAINE (LET) SOLUTION
3.0000 mL | Freq: Once | NASAL | Status: AC
  Administered 2015-01-10: 3 mL via TOPICAL
  Filled 2015-01-10: qty 3

## 2015-01-10 MED ORDER — KETOROLAC TROMETHAMINE 30 MG/ML IJ SOLN
30.0000 mg | Freq: Once | INTRAMUSCULAR | Status: AC
  Administered 2015-01-10: 30 mg via INTRAMUSCULAR
  Filled 2015-01-10: qty 1

## 2015-01-10 MED ORDER — HYDROMORPHONE HCL 1 MG/ML IJ SOLN
1.0000 mg | Freq: Once | INTRAMUSCULAR | Status: DC
Start: 2015-01-10 — End: 2015-01-10

## 2015-01-10 MED ORDER — LIDOCAINE-EPINEPHRINE 2 %-1:100000 IJ SOLN
30.0000 mL | Freq: Once | INTRAMUSCULAR | Status: AC
  Administered 2015-01-10: 30 mL
  Filled 2015-01-10: qty 2

## 2015-01-10 MED ORDER — KETOROLAC TROMETHAMINE 30 MG/ML IJ SOLN: 30.0000 mg | Freq: Once | INTRAMUSCULAR | Status: DC

## 2015-01-10 NOTE — ED Notes (Signed)
Pt c/o knot on left shoulder blade area that has been there about 2 weeks that is very painful.  Pt denies any drainage.

## 2015-01-10 NOTE — Discharge Instructions (Signed)
Please keep her wound clean for the first 24 hours. Return without fail for worsening symptoms, including increased swelling, redness, and pain involving your wound, fevers, night sweats, vomiting unable to keep down food or fluids, or any other symptoms concerning to you.  Abscess An abscess (boil or furuncle) is an infected area on or under the skin. This area is filled with yellowish-white fluid (pus) and other material (debris). HOME CARE   Only take medicines as told by your doctor.  If you were given antibiotic medicine, take it as directed. Finish the medicine even if you start to feel better.  If gauze is used, follow your doctor's directions for changing the gauze.  To avoid spreading the infection:  Keep your abscess covered with a bandage.  Wash your hands well.  Do not share personal care items, towels, or whirlpools with others.  Avoid skin contact with others.  Keep your skin and clothes clean around the abscess.  Keep all doctor visits as told. GET HELP RIGHT AWAY IF:   You have more pain, puffiness (swelling), or redness in the wound site.  You have more fluid or blood coming from the wound site.  You have muscle aches, chills, or you feel sick.  You have a fever. MAKE SURE YOU:   Understand these instructions.  Will watch your condition.  Will get help right away if you are not doing well or get worse. Document Released: 11/05/2007 Document Revised: 11/18/2011 Document Reviewed: 08/01/2011 [facility] Patient Information 2015 [place], [place]. This information is not intended to replace advice given to you by your health care provider. Make sure you discuss any questions you have with your health care provider.   Emergency Department Resource Guide 1) Find a Doctor and Pay Out of Pocket Although you won't have to find out who is covered by your insurance plan, it is a good idea to ask around and get recommendations. You will then need to call the office and  see if the doctor you have chosen will accept you as a new patient and what types of options they offer for patients who are self-pay. Some doctors offer discounts or will set up payment plans for their patients who do not have insurance, but you will need to ask so you aren't surprised when you get to your appointment.  2) Contact Your Local Health Department Not all health departments have doctors that can see patients for sick visits, but many do, so it is worth a call to see if yours does. If you don't know where your local health department is, you can check in your phone book. The CDC also has a tool to help you locate your state's health department, and many state websites also have listings of all of their local health departments.  3) Find a Walk-in Clinic If your illness is not likely to be very severe or complicated, you may want to try a walk in clinic. These are popping up all over the country in pharmacies, drugstores, and shopping centers. They're usually staffed by nurse practitioners or physician assistants that have been trained to treat common illnesses and complaints. They're usually fairly quick and inexpensive. However, if you have serious medical issues or chronic medical problems, these are probably not your best option.  No Primary Care Doctor: - Call Health Connect at  [phone] - they can help you locate a primary care doctor that  accepts your insurance, provides certain services, etc. - Physician Referral Service- [phone]  Chronic  Pain Problems: Organization         Address  Phone   Notes  [facility]  [phone] Patients need to be referred by their primary care doctor.   Medication Assistance: Organization         Address  Phone   Notes  [facility] Medication [facility] [address] [place], [place] [address] [phone] --Must be a resident of [facility] -- Must have NO insurance coverage whatsoever  (no Medicaid/ Medicare, etc.) -- The pt. MUST have a primary care doctor that directs their care regularly and follows them in the community   [facility]  [phone]   [employer]  [phone]    Agencies that provide inexpensive medical care: Organization         Address  Phone   Notes  [facility]  [phone]   [facility]    [phone]   [facility] [address] [place], [place] [address] [phone]   [facility] [address] [place] [phone]   [facility]anned Parenthood    [phone]   [facility]    [phone]   [facility]  [address], [place] Phone:  [phone], Fax:  [phone] Hours of Operation:  9 am - 6 pm, M-F.  Also accepts Medicaid/Medicare and self-pay.  [facility]  [address], [place] Phone: [phone], Fax: [phone]. Hours of Operation:  8:30 am - 5:30 pm, M-F.  Also accepts Medicaid and self-pay.  [facility] [place] [address] [place] Phone: [phone]   [facility] [address] Natasha Bence [place], [place] [phone], Ext. [phone] Mondays & Thursdays: 7-9 AM.  First 15 patients are seen on a first come, first serve basis.    Medicaid-accepting [facility] Providers:  Organization         Address  Phone   Notes  [facility] [address], [place] [phone] Also accepts self-pay patients.  [facility] [address] Laurell Josephs [place], [place]  [phone]   [facility] [address], [place] [phone]   [facility] [address] [place] [phone]   Renaye Rakers [address], [place]   [phone] Only accepts [place] patients after they have their name applied to their  card.   Self-Pay (no insurance) in [facility]:  Organization         Address  Phone   Notes  Sickle Cell Patients, [facility] [address] [place], [place] [phone]   [facility] [address] [place], [place] [phone]   [facility] [place]  [address],  [phone]   [facility]/Dr. Osei-Bonsu  [address], [place] or [address], [place] [phone] Phone number for both [place] and [place] locations is the same.  [facility] [address], [place] [phone]   [facility] [address] [place] or [address] [phone]   [facility] [address], [place] [phone], phone; [phone], fax Sees patients  1st and 3rd Saturday of every month.  Must not qualify for public or private insurance (i.e. Medicaid, Medicare, [place] Health Choice, Veterans' Benefits)  Household income should be no more than 200% of the poverty level The clinic cannot treat you if you are pregnant or think you are pregnant  Sexually transmitted diseases are not treated at the clinic.

## 2015-01-10 NOTE — ED Provider Notes (Signed)
CSN: [id number]     Arrival date & time 01/10/15  4098 History   First MD Initiated Contact with Patient 01/10/15 [phone]     Chief Complaint  Patient presents with  . Abscess     (Consider location/radiation/quality/duration/timing/severity/associated sxs/prior Treatment) HPI  39 year old female who presents with abscess. No history of immunosuppression. Reports 2 weeks ago develop a small knot to the left upper back, that has gotten bigger. Denies fever, chills, nausea, vomiting, chest pain, or difficulty breathing. Reports having a similar knot there in the past, but tried to pop it and it was only bruised and went away.   Past Medical History  Diagnosis Date  . 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 Depression   . Seasonal allergies   . Abnormal Pap smear   . Infection     UTI  . Kidney abscess    Past Surgical History  Procedure Laterality Date  . Colposcopy    . Wisdom tooth extraction    . Tubal ligation    . Anterior cruciate ligament repair      rt  . Urethral stint     Family History  Problem Relation Age of Onset  . Anesthesia problems Neg Hx   . Hypotension Neg Hx   . Malignant hyperthermia Neg Hx   . Pseudochol deficiency Neg Hx   . Cancer Father     15   Social History  Substance Use Topics  . Smoking status: Current Every Day Smoker -- 0.50 packs/day for 26 years    Types: Cigarettes  . Smokeless tobacco: Never Used  . Alcohol Use: 0.0 oz/week     Comment: occasional    OB History    Gravida Para Term Preterm AB TAB SAB Ectopic Multiple Living   Review of Systems  Constitutional: Negative for fever and chills.  Respiratory: Negative for shortness of breath.   Cardiovascular: Negative for chest pain.  Gastrointestinal: Negative for nausea and vomiting.  Skin: Negative for wound.  Allergic/Immunologic: Negative for immunocompromised state.  Neurological: Negative for headaches.  Hematological:  Does not bruise/bleed easily.      Allergies  Morphine and related  Home Medications   Prior to Admission medications   Medication Sig Start Date End Date Taking? Authorizing Provider  acetaminophen (TYLENOL) 500 MG tablet Take 1,000 mg by mouth every 6 (six) hours as needed for mild pain, moderate pain or fever.   Yes Historical Provider, MD  Chlorphen-Pseudoephed-APAP (SINUS PAIN RELIEF MAX ST PO) Take 2 tablets by mouth daily as needed (sinus pressure).   Yes Historical Provider, MD  Doxylamine Succinate, Sleep, (SLEEP AID PO) Take 2 tablets by mouth at bedtime as needed (sleep).   Yes Historical Provider, MD  ibuprofen (ADVIL,MOTRIN) 200 MG tablet Take 200 mg by mouth every 6 (six) hours as needed for moderate pain.   Yes Historical Provider, MD   BP 117/85 mmHg  Pulse 89  Temp(Src) 98.4 F (36.9 C) (Oral)  Resp 17  SpO2 99% Physical Exam Physical Exam  Nursing note and vitals reviewed. Constitutional: Well developed, well nourished, non-toxic, and in no acute distress Head: Normocephalic and atraumatic.  Mouth/Throat: Oropharynx is clear and moist.  Neck: Normal range of motion. Neck supple.  Cardiovascular: Normal rate and regular rhythm.  No edema Pulmonary/Chest: Effort normal and breath sounds normal.  Abdominal: Soft. There is  no tenderness. There is no rebound and no guarding.  Musculoskeletal: No deformities. Neurological: Alert, no facial droop, fluent speech, moves all extremities symmetrically Skin: Skin is warm and dry. 3 x 4 cm area of swelling/redness with underlying fluctuance, and no surrounding erythema or induration noted to the mid left scapula.  Psychiatric: Cooperative    ED Course  INCISION AND DRAINAGE Date/Time: 01/10/2015 10:27 AM Performed by: Crista Curb DUO Authorized by: Crista Curb DUO Consent: Verbal consent obtained. Risks and benefits: risks, benefits and alternatives were discussed Consent given by: patient Patient identity confirmed:  verbally with patient Time out: Immediately prior to procedure a "time out" was called to verify the correct patient, procedure, equipment, support staff and site/side marked as required. Type: abscess Body area: trunk Location details: back Anesthesia: local infiltration Local anesthetic: lidocaine 1% with epinephrine Anesthetic total: 5 ml Patient sedated: no Scalpel size: 11 Incision type: single straight Incision depth: subcutaneous Complexity: simple Drainage: purulent Drainage amount: moderate Wound treatment: wound left open Packing material: none Patient tolerance: Patient tolerated the procedure well with no immediate complications   (including critical care time) Labs Review Labs Reviewed - No data to display  Imaging Review No results found.   EKG Interpretation None      MDM   Final diagnoses:  Abscess   EMERGENCY DEPARTMENT US SOFT TISSUE INTERPRETATION "Study: Limited Ultrasound of the noted body part in comments below"  INDICATIONS: Soft tissue infection Multiple views of the body part are obtained with a multi-frequency linear probe  PERFORMED BY:  Myself  IMAGES ARCHIVED?: No  SIDE:Left  BODY PART:Upper back  FINDINGS: Abcess present  LIMITATIONS: none  INTERPRETATION:  Abcess present  COMMENT:  N/A    39 year old female who presents with left upper back abscess. No history of immunosuppression. Vitals signs normal and no concerns for systemic illness. [place] suggestive of abscess, 3 x 4 cm in size. No surrounding cellulitis noted. I&D performed at bedside. Wound care instructions reviewed. Strict return and follow-up instructions reviewed. She expressed understanding of all discharge instructions and felt comfortable with the plan of care.     Lavera Guise, MD 01/10/15 1950

## 2015-02-01 ENCOUNTER — Encounter (INDEPENDENT_AMBULATORY_CARE_PROVIDER_SITE_OTHER): Payer: Self-pay | Admitting: Internal Medicine

## 2015-02-01 ENCOUNTER — Ambulatory Visit (FREE_STANDING_LABORATORY_FACILITY): Payer: No Typology Code available for payment source | Admitting: Internal Medicine

## 2015-02-01 ENCOUNTER — Encounter (INDEPENDENT_AMBULATORY_CARE_PROVIDER_SITE_OTHER): Payer: Self-pay

## 2015-02-01 VITALS — BP 116/77 | HR 71 | Temp 98.5°F | Ht 65.0 in | Wt 128.6 lb

## 2015-02-01 DIAGNOSIS — Z23 Encounter for immunization: Secondary | ICD-10-CM

## 2015-02-01 DIAGNOSIS — J029 Acute pharyngitis, unspecified: Secondary | ICD-10-CM

## 2015-02-01 DIAGNOSIS — Z0184 Encounter for antibody response examination: Secondary | ICD-10-CM

## 2015-02-01 DIAGNOSIS — Z1239 Encounter for other screening for malignant neoplasm of breast: Secondary | ICD-10-CM

## 2015-02-01 DIAGNOSIS — Z0000 Encounter for general adult medical examination without abnormal findings: Secondary | ICD-10-CM

## 2015-02-01 LAB — POCT RAPID STREP A: Rapid Strep A Screen POCT: NEGATIVE

## 2015-02-01 MED ORDER — METHYLPHENIDATE HCL ER (OSM) 18 MG PO TBCR
18.0000 mg | EXTENDED_RELEASE_TABLET | Freq: Every day | ORAL | Status: DC
Start: 2015-02-01 — End: 2015-04-10

## 2015-02-01 MED ORDER — BUPROPION HCL 100 MG PO TABS
100.0000 mg | ORAL_TABLET | Freq: Two times a day (BID) | ORAL | Status: DC
Start: 2015-02-01 — End: 2015-08-29

## 2015-02-01 NOTE — Progress Notes (Signed)
Subjective:       Patient ID: Deborah Kirk is a 39 y.o. 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 She needs documentation of immunity status.  HPI  Insomnia - she take Lunesta 1 mg twice weekly  Anxiety - she takes Wellbutrin 100 mg BID with improvement. She is on waiting list to see counselor.  ADD - she is on Concerta ER 18 mg daily. No palpitations. weight loss.    Past Surgical History   Procedure Laterality Date   . Cesarean section     . Wisdom tooth extraction       Past Medical History   Diagnosis Date   . Attention deficit hyperactivity disorder (ADHD)    . Vaginal yeast infection      chronic   . Anxiety      Allergies   Allergen Reactions   . Keflex [Cephalexin] Hives   . Nitrofurantoin      AKA Macrobid   . Percocet [Oxycodone-Acetaminophen] Hives   . Sulfa Antibiotics Hives     Medication Sig   . buPROPion (WELLBUTRIN) 100 MG tablet Take 1 tablet (100 mg total) by mouth 2 (two) times daily.   . eszopiclone (LUNESTA) 1 MG tablet Take 1 tablet (1 mg total) by mouth nightly. Take immediately before bedtime   . methylphenidate (CONCERTA) 18 MG CR tablet Take 1 tablet (18 mg total) by mouth daily.   . Multiple Vitamin (MULTIVITAMIN) capsule Take 1 capsule by mouth daily.   . Tranexamic Acid 650 MG Tab Take 2 tablets by mouth 3 (three) times daily as needed. Heavy menses       Social History   Substance Use Topics   . Smoking status: Never Smoker    . Smokeless tobacco: Never Used   . Alcohol Use: No     Family History   Problem Relation Age of Onset   . Sleep disorder Mother    . Anxiety disorder Mother    . Hepatitis Mother    . Cancer Mother    . Alzheimer's disease Father    . Thyroid disease Sister      The following portions of the patient's history were reviewed and updated as appropriate: allergies, current medications, past family history, past medical history, past social history, past surgical history and problem  list.    Review of Systems   HENT:        Recent sore throat - no fever, adenopathy. She states her children arestrep carriers.   Eyes: Negative.    Respiratory: Negative.    Cardiovascular: Negative.    Gastrointestinal: Negative.    Genitourinary:        Pap smear April 2016   Musculoskeletal: Negative.    Skin: Negative.    Neurological: Negative.  Negative for dizziness, light-headedness and headaches.   Psychiatric/Behavioral: Positive for decreased concentration. Negative for sleep disturbance and dysphoric mood.        Better with Concerta           Objective:    Physical Exam  BP 116/77 mmHg  Pulse 71  Temp(Src) 98.5 F (36.9 C)  Ht 1.651 m (5\' 5" )  Wt 58.333 kg (128 lb 9.6 oz)  BMI 21.40 kg/m2  Constitutional:  Alert, oriented X 3. NAD  Head    Atraumatic, normocephalic  Eye:   Conjunctival - pink, PERRL  EENT   Oropharynx is clear  Neck:  Trachea is midline, no thyromegaly  Lymph:  No cervical or supraclavicular adenopathy  Chest:   Unlabored respirations. Lungs clear to auscultation, good breath sounds  Cardiac:  Normal S1&S2 without murmurs. No edema.  Abdomen:  Soft, nontender, no palpable masses, liver or spleen  Back   No CVA or spinous tenderness  Skin:   Warm, dry, no rashes in visible areas  Psych:   Normal affect, thought content        Assessment:       1. Routine general medical examination at a health care facility    2. Need for diphtheria-tetanus-pertussis (Tdap) vaccine, adult/adolescent    3. Need for immunization against influenza    4. Immunity status testing    5. Breast cancer screening    6. Sore throat             Plan:      Procedures Rapid strep - negative; Tdap, Influzena vaccine    Continue current medications with routine laboratory monitoring, annual influenza vaccine, seat belts, regular dental visits,regular exercise,and healthy diet.  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

## 2015-02-01 NOTE — Progress Notes (Signed)
Pt aware that she is overdue for a mammogram and would like to set up an appointment.    Have you seen any new specialist outside of [place] since your last office visit?  No.

## 2015-02-01 NOTE — Progress Notes (Signed)
Patient presented to the office for Tdap  administration.  Received injection in the Right arm.  No reaction was noted and patient left in good condition.     Patient presented to the office for Influenza administration.  Received injection in the Left arm.  No reaction was noted and patient left in good condition.

## 2015-02-02 LAB — RUBELLA ANTIBODY, IGG: Rubella AB, IgG: 4.04

## 2015-02-02 LAB — MUMPS ANTIBODY, IGG: Mumps Ab, IgG: 1.23

## 2015-02-02 LAB — RUBEOLA ANTIBODY IGG: Rubeola (Measles), IgG: 2.38

## 2015-02-02 LAB — VARICELLA ZOSTER ANTIBODY, IGG: Varicella, IgG: 2.33

## 2015-02-06 ENCOUNTER — Ambulatory Visit (INDEPENDENT_AMBULATORY_CARE_PROVIDER_SITE_OTHER): Payer: 59

## 2015-02-06 ENCOUNTER — Other Ambulatory Visit (INDEPENDENT_AMBULATORY_CARE_PROVIDER_SITE_OTHER): Payer: Self-pay | Admitting: Internal Medicine

## 2015-02-06 DIAGNOSIS — Z111 Encounter for screening for respiratory tuberculosis: Secondary | ICD-10-CM

## 2015-02-07 ENCOUNTER — Ambulatory Visit (INDEPENDENT_AMBULATORY_CARE_PROVIDER_SITE_OTHER): Payer: No Typology Code available for payment source

## 2015-02-07 DIAGNOSIS — Z111 Encounter for screening for respiratory tuberculosis: Secondary | ICD-10-CM

## 2015-02-07 NOTE — Progress Notes (Signed)
Patient presented to the office for TB screening.  The PPD was placed on the inner aspect of the Left arm.  Patient tolerated procedure and left in good condition.

## 2015-02-09 ENCOUNTER — Ambulatory Visit (INDEPENDENT_AMBULATORY_CARE_PROVIDER_SITE_OTHER): Payer: No Typology Code available for payment source

## 2015-02-09 ENCOUNTER — Ambulatory Visit
Admission: RE | Admit: 2015-02-09 | Discharge: 2015-02-09 | Disposition: A | Discharge status: Home / Self Care | Payer: No Typology Code available for payment source | Source: Ambulatory Visit | Attending: Specialist | Admitting: Specialist

## 2015-02-09 ENCOUNTER — Ambulatory Visit
Admission: RE | Admit: 2015-02-09 | Discharge: 2015-02-09 | Disposition: A | Discharge status: Home / Self Care | Payer: No Typology Code available for payment source | Source: Ambulatory Visit | Attending: Internal Medicine | Admitting: Internal Medicine

## 2015-02-09 DIAGNOSIS — N8320 Unspecified ovarian cysts: Secondary | ICD-10-CM | POA: Insufficient documentation

## 2015-02-09 DIAGNOSIS — Z1231 Encounter for screening mammogram for malignant neoplasm of breast: Secondary | ICD-10-CM | POA: Insufficient documentation

## 2015-02-09 DIAGNOSIS — Z111 Encounter for screening for respiratory tuberculosis: Secondary | ICD-10-CM

## 2015-02-09 DIAGNOSIS — N926 Irregular menstruation, unspecified: Secondary | ICD-10-CM | POA: Insufficient documentation

## 2015-02-09 NOTE — Progress Notes (Signed)
Patient presented to the office for PPD reading.  Results were Negative.    Results relayed to physician for recommended follow-up.

## 2015-03-27 ENCOUNTER — Ambulatory Visit (INDEPENDENT_AMBULATORY_CARE_PROVIDER_SITE_OTHER): Payer: No Typology Code available for payment source | Admitting: Internal Medicine

## 2015-03-27 ENCOUNTER — Encounter (INDEPENDENT_AMBULATORY_CARE_PROVIDER_SITE_OTHER): Payer: Self-pay | Admitting: Internal Medicine

## 2015-03-27 VITALS — BP 109/72 | HR 66 | Temp 98.1°F | Wt 127.6 lb

## 2015-03-27 DIAGNOSIS — H109 Unspecified conjunctivitis: Secondary | ICD-10-CM

## 2015-03-27 MED ORDER — OFLOXACIN 0.3 % OP SOLN
1.0000 [drp] | Freq: Four times a day (QID) | OPHTHALMIC | Status: DC
Start: 2015-03-27 — End: 2015-04-10

## 2015-03-27 NOTE — Progress Notes (Signed)
SUBJECTIVE:   39 y.o. female with burning, redness, discharge and mattering in both eyes for 2 days.  No other symptoms.  No significant prior ophthalmological history. No change in visual acuity, no photophobia, no severe eye pain.  She says that one of her patient had conjunctivitis. She had some crusting this morning with mild blurred vision but no eye pain, no nausea, no vomiting and no headache. She does not wear or use contact lenses.    Review of Systems   Constitutional: Negative for fever, chills and weight loss.   HENT: Negative for congestion and sore throat.    Eyes: Positive for blurred vision, discharge and redness. Negative for double vision, photophobia and pain.   Respiratory: Negative for cough, sputum production and shortness of breath.    Cardiovascular: Negative for chest pain and palpitations.   Gastrointestinal: Negative for nausea, vomiting and abdominal pain.   Skin: Negative for itching and rash.     Patient Active Problem List   Diagnosis   . ADD (attention deficit disorder)   . Primary insomnia   . Anxiety   . Back strain, initial encounter     Outpatient Prescriptions Marked as Taking for the 03/27/15 encounter (Office Visit) with Leticia Clas, DO   Medication Sig Dispense Refill   . buPROPion (WELLBUTRIN) 100 MG tablet Take 1 tablet (100 mg total) by mouth 2 (two) times daily. 180 tablet 3   . eszopiclone (LUNESTA) 1 MG tablet Take 1 tablet (1 mg total) by mouth nightly. Take immediately before bedtime 90 tablet 3   . methylphenidate (CONCERTA) 18 MG CR tablet Take 1 tablet (18 mg total) by mouth daily. 90 tablet 0   . Multiple Vitamin (MULTIVITAMIN) capsule Take 1 capsule by mouth daily.     . Tranexamic Acid 650 MG Tab Take 2 tablets by mouth 3 (three) times daily as needed. Heavy menses 60 tablet 11     Social History     Social History   . Marital Status: Married     Spouse Name: N/A   . Number of Children: N/A   . Years of Education: N/A     Occupational History   . Not on file.      Social History Main Topics   . Smoking status: Never Smoker    . Smokeless tobacco: Never Used   . Alcohol Use: No   . Drug Use: No   . Sexual Activity:     Partners: Male     Birth Control/ Protection: Condom      Comment: spouse vasectomy     Other Topics Concern   . Not on file     Social History Narrative     BP 109/72 mmHg  Pulse 66  Temp(Src) 98.1 F (36.7 C) (Oral)  Wt 57.879 kg (127 lb 9.6 oz)  LMP 03/17/2015    OBJECTIVE:   Patient appears well, vitals signs are normal.  Mental status exam; she is alert, orient to time, person and place. Normal thought content, speech, affect, mood and dress are noted.   Eyes: both eyes with findings of typical conjunctivitis noted; erythema and discharge. PERRLA, no foreign body noted. No periorbital cellulitis. The corneas are clear and fundi normal. Visual acuity normal.     ASSESSMENT:   Conjunctivitis - probably bacterial    PLAN:   Antibiotic drops per order. Hygiene discussed. If other family members develop same condition, may use same medication for them if they are not known to  be allergic to it. Call prn.  Current and alternate treatment plans discussed with patient. Patient understands and agrees with current plan. Written instruction was reviewed with the patient and a copy was given to the patient. Common side affect of medications were discussed with the  patient and all questions were addressed.    Kayslee Furey S. Rudolpho Sevin, D.O.    [facility]  [address]  [address]   [place], [place] [address]  Phone: [phone]  Fax:     [phone]

## 2015-03-27 NOTE — Patient Instructions (Signed)
Conjunctivitis:    This is contagious. Always wash hand each time your touch your eye  Use antibiotic solution Ofloxin one  Drop in each eye 3 to 4 times per day for about 5 days.  Call us back if you are not better in one week . Call soon if you have severe eye pain, or vision problems      Conjunctivitis Caused by Infection  Infections are caused by viruses or germs (bacteria). Treatment includes keeping your eyes and hands clean. Your health care provider may prescribe eye drops, and tell you to stay home from work or school if you're contagious. Untreated infections can be serious. It'simportant to see yourprovider for a diagnosis.    Viral infections  A cold, flu, or other virus can spread to your eyes. This causes a watery discharge. Your eyes may burn or itch and get red. Your eyelids may also be puffy and sore.  Treatment  Most viral infections go away on their own. Artificial tears and warm compresses can relieve symptoms. Your provider may also prescribe eye drops. A viral infection can be very contagious and spreads quickly. To prevent this, wash your hands often. Use a separate tissue to wipe each eye. Don't touch your eyes or share bedding or towels.  Bacterial infections  Bacterial infections often occur in one eye. There may be a watery or a thick discharge from the eye. These infections can cause serious damage to your eye if not treated promptly.  Treatment  Your provider may prescribe eye drops or ointment to kill the bacteria. Warm compresses can help keep the eyelids clean. To keep the bacteria from spreading, wash your hands often. Use a separate tissue to wipe each eye. Don't touch your eyes or share bedding or towels.   [address] [employer] [address] [place], [place] [address]. All rights reserved. This information is not intended as a substitute for professional medical care. Always follow your healthcare professional's instructions.

## 2015-03-27 NOTE — Progress Notes (Signed)
Have you seen any specialists outside [facility]?   NO

## 2015-03-29 ENCOUNTER — Ambulatory Visit (INDEPENDENT_AMBULATORY_CARE_PROVIDER_SITE_OTHER): Payer: No Typology Code available for payment source | Admitting: Internal Medicine

## 2015-03-29 ENCOUNTER — Ambulatory Visit (INDEPENDENT_AMBULATORY_CARE_PROVIDER_SITE_OTHER): Payer: Self-pay | Admitting: Internal Medicine

## 2015-03-29 ENCOUNTER — Encounter (INDEPENDENT_AMBULATORY_CARE_PROVIDER_SITE_OTHER): Payer: Self-pay | Admitting: Internal Medicine

## 2015-03-29 ENCOUNTER — Other Ambulatory Visit (INDEPENDENT_AMBULATORY_CARE_PROVIDER_SITE_OTHER): Payer: Self-pay

## 2015-03-29 VITALS — BP 109/73 | HR 75 | Temp 98.5°F | Wt 127.6 lb

## 2015-03-29 DIAGNOSIS — J329 Chronic sinusitis, unspecified: Secondary | ICD-10-CM

## 2015-03-29 DIAGNOSIS — H6983 Other specified disorders of Eustachian tube, bilateral: Secondary | ICD-10-CM

## 2015-03-29 NOTE — Progress Notes (Signed)
Have you seen any specialists outside [facility]?   NO

## 2015-03-29 NOTE — Patient Instructions (Addendum)
Take Sudafed one tablet twice daily or use over the counter Theraflu cold and sinus one tablet twice daily  Use warm compresses over the area every 6 hours for 5 to 10 minutes until pain is gone.  Call back in 3 to 4 days if not better or if you feel worse.

## 2015-03-29 NOTE — Progress Notes (Signed)
SUBJECTIVE:   Deborah Kirk is a 39 y.o. female who complains of bilateral ear discomfort, sinus congestion and sinus pain for 2 days.   Was seen here for conjunctivitis two days ago. Says current symptoms started after office visit.  She denies a history of chest pain, chills, dizziness, fatigue, fevers, myalgias, nausea, shortness of breath, sweats, vomiting, weakness, wheezing, cough and sputum production and does not a history of asthma. Patient does not smoke cigarettes. Eye symptoms are mostly improved.    Review of Systems   Constitutional: Negative for fever, chills and malaise/fatigue.   Respiratory: Negative for cough, hemoptysis and sputum production.    Cardiovascular: Negative for chest pain and palpitations.   Gastrointestinal: Negative for heartburn, nausea, vomiting and abdominal pain.   Genitourinary: Negative for dysuria, frequency and hematuria.   Skin: Negative for itching and rash.     Patient Active Problem List   Diagnosis   . ADD (attention deficit disorder)   . Primary insomnia   . Anxiety   . Back strain, initial encounter     Outpatient Prescriptions Marked as Taking for the 03/29/15 encounter (Office Visit) with Leticia Clas, DO   Medication Sig Dispense Refill   . buPROPion (WELLBUTRIN) 100 MG tablet Take 1 tablet (100 mg total) by mouth 2 (two) times daily. 180 tablet 3   . eszopiclone (LUNESTA) 1 MG tablet Take 1 tablet (1 mg total) by mouth nightly. Take immediately before bedtime 90 tablet 3   . methylphenidate (CONCERTA) 18 MG CR tablet Take 1 tablet (18 mg total) by mouth daily. 90 tablet 0   . Multiple Vitamin (MULTIVITAMIN) capsule Take 1 capsule by mouth daily.     Marland Kitchen ofloxacin (OCUFLOX) 0.3 % ophthalmic solution Place 1 drop into both eyes 4 (four) times daily. 5 mL 0   . Tranexamic Acid 650 MG Tab Take 2 tablets by mouth 3 (three) times daily as needed. Heavy menses 60 tablet 11     Social History     Social History   . Marital Status: Married     Spouse Name: N/A   .  Number of Children: N/A   . Years of Education: N/A     Occupational History   . Not on file.     Social History Main Topics   . Smoking status: Never Smoker    . Smokeless tobacco: Never Used   . Alcohol Use: No   . Drug Use: No   . Sexual Activity:     Partners: Male     Birth Control/ Protection: Condom      Comment: spouse vasectomy     Other Topics Concern   . Not on file     Social History Narrative     BP 109/73 mmHg  Pulse 75  Temp(Src) 98.5 F (36.9 C) (Oral)  Wt 57.879 kg (127 lb 9.6 oz)  LMP 03/17/2015         OBJECTIVE:  She appears well, vital signs are as noted.  Mental status exam; she is alert, orient to time, person and place. Normal thought content, speech, affect, mood and dress are noted.  Ears normal.  Ear canals without any debris, TMs intact, no bogginess. Throat and pharynx normal.  Neck supple. No adenopathy in the neck. Nose is congested. Sinuses are slightly tender.   The chest is clear, without wheezes or rales.  S1 and S2 normal, no murmurs, clicks, gallops or rubs. Regular rate and rhythm.  ASSESSMENT:   Viral rhinosinusitis  ET tube dysfunction.    PLAN:  Symptomatic therapy suggested: push fluids, rest, gargle warm salt water, use vaporizer or mist prn, use antihistamine-decongestant of choice prn and return office visit prn if symptoms persist or worsen. Lack of antibiotic effectiveness discussed with her. Call or return to clinic prn if these symptoms worsen or fail to improve as anticipated.  Current and alternate treatment plans discussed with patient. Patient understands and agrees with current plan. Written instruction was reviewed with the patient and a copy was given to the patient. Common side affect of medications were discussed with the  patient and all questions were addressed.    Tenika Keeran S. Rudolpho Sevin, D.O.    [facility]  [address]  [address]   [place], [place] [address]  Phone: [phone]  Fax:     [phone]

## 2015-03-31 ENCOUNTER — Emergency Department (HOSPITAL_COMMUNITY)
Admission: EM | Admit: 2015-03-31 | Discharge: 2015-03-31 | Disposition: A | Discharge status: Home / Self Care | Payer: Self-pay | Attending: Emergency Medicine | Admitting: Emergency Medicine

## 2015-03-31 ENCOUNTER — Encounter (HOSPITAL_COMMUNITY): Payer: Self-pay | Admitting: *Deleted

## 2015-03-31 ENCOUNTER — Emergency Department (HOSPITAL_COMMUNITY): Payer: Self-pay

## 2015-03-31 DIAGNOSIS — J209 Acute bronchitis, unspecified: Secondary | ICD-10-CM | POA: Insufficient documentation

## 2015-03-31 DIAGNOSIS — Z87440 Personal history of urinary (tract) infections: Secondary | ICD-10-CM | POA: Insufficient documentation

## 2015-03-31 DIAGNOSIS — Z22330 Carrier of Group B streptococcus: Secondary | ICD-10-CM | POA: Insufficient documentation

## 2015-03-31 DIAGNOSIS — Z720 Tobacco use: Secondary | ICD-10-CM | POA: Insufficient documentation

## 2015-03-31 DIAGNOSIS — J40 Bronchitis, not specified as acute or chronic: Secondary | ICD-10-CM

## 2015-03-31 DIAGNOSIS — H9209 Otalgia, unspecified ear: Secondary | ICD-10-CM | POA: Insufficient documentation

## 2015-03-31 DIAGNOSIS — Z8619 Personal history of other infectious and parasitic diseases: Secondary | ICD-10-CM | POA: Insufficient documentation

## 2015-03-31 DIAGNOSIS — Z87448 Personal history of other diseases of urinary system: Secondary | ICD-10-CM | POA: Insufficient documentation

## 2015-03-31 DIAGNOSIS — J069 Acute upper respiratory infection, unspecified: Secondary | ICD-10-CM | POA: Insufficient documentation

## 2015-03-31 DIAGNOSIS — Z8659 Personal history of other mental and behavioral disorders: Secondary | ICD-10-CM | POA: Insufficient documentation

## 2015-03-31 LAB — URINE MICROSCOPIC-ADD ON

## 2015-03-31 LAB — URINALYSIS, ROUTINE W REFLEX MICROSCOPIC
Bilirubin Urine: NEGATIVE
GLUCOSE, UA: NEGATIVE mg/dL
Hgb urine dipstick: NEGATIVE
Ketones, ur: NEGATIVE mg/dL
Nitrite: NEGATIVE
PH: 7 (ref 5.0–8.0)
PROTEIN: NEGATIVE mg/dL
Specific Gravity, Urine: 1.02 (ref 1.005–1.030)
Urobilinogen, UA: 1 mg/dL (ref 0.0–1.0)

## 2015-03-31 MED ORDER — NITROFURANTOIN MONOHYD MACRO 100 MG PO CAPS: 100.0000 mg | ORAL_CAPSULE | Freq: Two times a day (BID) | ORAL | Status: DC

## 2015-03-31 MED ORDER — AZITHROMYCIN 250 MG PO TABS: ORAL_TABLET | ORAL | Status: DC

## 2015-03-31 MED ORDER — HYDROCOD POLST-CPM POLST ER 10-8 MG/5ML PO SUER
5.0000 mL | Freq: Once | ORAL | Status: AC
Start: 2015-03-31 — End: 2015-03-31
  Administered 2015-03-31: 5 mL via ORAL
  Filled 2015-03-31: qty 5

## 2015-03-31 MED ORDER — IPRATROPIUM-ALBUTEROL 0.5-2.5 (3) MG/3ML IN SOLN
3.0000 mL | RESPIRATORY_TRACT | Status: DC
  Administered 2015-03-31: 3 mL via RESPIRATORY_TRACT
  Filled 2015-03-31: qty 3

## 2015-03-31 NOTE — Discharge Instructions (Signed)
Viral Infections °A viral infection can be caused by different types of viruses. Most viral infections are not serious and resolve on their own. However, some infections may cause severe symptoms and may lead to further complications. °SYMPTOMS °Viruses can frequently cause: °· Minor sore throat. °· Aches and pains. °· Headaches. °· Runny nose. °· Different types of rashes. °· Watery eyes. °· Tiredness. °· Cough. °· Loss of appetite. °· Gastrointestinal infections, resulting in nausea, vomiting, and diarrhea. °These symptoms do not respond to antibiotics because the infection is not caused by bacteria. However, you might catch a bacterial infection following the viral infection. This is sometimes called a "superinfection." Symptoms of such a bacterial infection may include: °· Worsening sore throat with pus and difficulty swallowing. °· Swollen neck glands. °· Chills and a high or persistent fever. °· Severe headache. °· Tenderness over the sinuses. °· Persistent overall ill feeling (malaise), muscle aches, and tiredness (fatigue). °· Persistent cough. °· Yellow, green, or brown mucus production with coughing. °HOME CARE INSTRUCTIONS  °· Only take over-the-counter or prescription medicines for pain, discomfort, diarrhea, or fever as directed by your caregiver. °· Drink enough water and fluids to keep your urine clear or pale yellow. Sports drinks can provide valuable electrolytes, sugars, and hydration. °· Get plenty of rest and maintain proper nutrition. Soups and broths with crackers or rice are fine. °SEEK IMMEDIATE MEDICAL CARE IF:  °· You have severe headaches, shortness of breath, chest pain, neck pain, or an unusual rash. °· You have uncontrolled vomiting, diarrhea, or you are unable to keep down fluids. °· You or your child has an oral temperature above 102° F (38.9° C), not controlled by medicine. °· Your baby is older than 3 months with a rectal temperature of 102° F (38.9° C) or higher. °· Your baby is 3  months old or younger with a rectal temperature of 100.4° F (38° C) or higher. °MAKE SURE YOU:  °· Understand these instructions. °· Will watch your condition. °· Will get help right away if you are not doing well or get worse. °  °This information is not intended to replace advice given to you by your health care provider. Make sure you discuss any questions you have with your health care provider. °  °Document Released: 02/26/2005 Document Revised: 08/11/2011 Document Reviewed: 10/25/2014 °Elsevier Interactive Patient Education ©2016 [employer] ° °

## 2015-03-31 NOTE — ED Provider Notes (Signed)
CSN: [id number]     Arrival date & time 03/31/15  1125 History   First MD Initiated Contact with Patient 03/31/15 1155     Chief Complaint  Patient presents with  . Headache  . Otalgia  . Cough     (Consider location/radiation/quality/duration/timing/severity/associated sxs/prior Treatment) HPI Comments: Patient here 3 weeks of headaches. She's had mild sensitivity to light. Described as a pressure sensation in her frontal sinuses. No fever, chills. She started to develop sinus congestion, cough, general malaise. She has no trouble breathing. She has no productivity to her cough. She had some posttussive emesis once yesterday, but otherwise no vomiting. No relief with excedrin.  Patient is a 39 y.o. female presenting with headaches, ear pain, and cough. The history is provided by the patient.  Headache Pain location:  Frontal Quality:  Dull (pressure-like) Onset quality:  Gradual Duration:  3 weeks Timing:  Intermittent Progression:  Unchanged Chronicity:  New Similar to prior headaches: no   Relieved by:  Nothing Worsened by:  Light Ineffective treatments: excedrin. Associated symptoms: cough and ear pain   Associated symptoms: no fever, no nausea and no vomiting   Otalgia Associated symptoms: cough and headaches   Associated symptoms: no fever and no vomiting   Cough Associated symptoms: ear pain and headaches   Associated symptoms: no fever and no shortness of breath     Past Medical History  Diagnosis Date  . 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 Depression   . Seasonal allergies   . Abnormal Pap smear   . Infection     UTI  . Kidney abscess    Past Surgical History  Procedure Laterality Date  . Colposcopy    . Wisdom tooth extraction    . Tubal ligation    . Anterior cruciate ligament repair      rt  . Urethral stint     Family History  Problem Relation Age of Onset  . Anesthesia problems Neg Hx   . Hypotension Neg Hx   .  Malignant hyperthermia Neg Hx   . Pseudochol deficiency Neg Hx   . Cancer Father     85   Social History  Substance Use Topics  . Smoking status: Current Every Day Smoker -- 0.50 packs/day for 26 years    Types: Cigarettes  . Smokeless tobacco: Never Used  . Alcohol Use: 0.0 oz/week     Comment: occasional    OB History    Gravida Para Term Preterm AB TAB SAB Ectopic Multiple Living   Review of Systems  Constitutional: Negative for fever.  HENT: Positive for ear pain.   Respiratory: Positive for cough. Negative for shortness of breath.   Gastrointestinal: Negative for nausea and vomiting.  Neurological: Positive for headaches.  All other systems reviewed and are negative.     Allergies  Morphine and related  Home Medications   Prior to Admission medications   Medication Sig Start Date End Date Taking? Authorizing Provider  acetaminophen (TYLENOL) 500 MG tablet Take 1,000 mg by mouth every 6 (six) hours as needed for mild pain, moderate pain or fever.    Historical Provider, MD  Chlorphen-Pseudoephed-APAP (SINUS PAIN RELIEF MAX ST PO) Take 2 tablets by mouth daily as needed (sinus pressure).    Historical Provider, MD  Doxylamine Succinate, Sleep, (SLEEP AID PO) Take 2 tablets by mouth at  bedtime as needed (sleep).    Historical Provider, MD  ibuprofen (ADVIL,MOTRIN) 200 MG tablet Take 200 mg by mouth every 6 (six) hours as needed for moderate pain.    Historical Provider, MD   BP 131/94 mmHg  Pulse 72  Temp(Src) 98.5 F (36.9 C) (Oral)  Resp 16  SpO2 98%  LMP 03/21/2015 (Exact Date) Physical Exam  Constitutional: She is oriented to person, place, and time. She appears well-developed and well-nourished. No distress.  HENT:  Head: Normocephalic and atraumatic.  Mouth/Throat: Oropharynx is clear and moist.  Eyes: EOM are normal. Pupils are equal, round, and reactive to light.  Neck: Normal range of motion. Neck supple.  Cardiovascular: Normal  rate and regular rhythm.  Exam reveals no friction rub.   No murmur heard. Pulmonary/Chest: Effort normal and breath sounds normal. No respiratory distress. She has no wheezes. She has no rales.  Abdominal: Soft. She exhibits no distension. There is no tenderness. There is no rebound.  Musculoskeletal: Normal range of motion. She exhibits no edema.  Neurological: She is alert and oriented to person, place, and time.  Skin: She is not diaphoretic.  Nursing note and vitals reviewed.   ED Course  Procedures (including critical care time) Labs Review Labs Reviewed  URINALYSIS, ROUTINE W REFLEX MICROSCOPIC (NOT AT [facility]) - Abnormal; Notable for the following:    Leukocytes, UA SMALL (*)    All other components within normal limits  URINE MICROSCOPIC-ADD ON - Abnormal; Notable for the following:    Squamous Epithelial / LPF FEW (*)    Bacteria, UA MANY (*)    All other components within normal limits    Imaging Review Dg Chest 2 View  03/31/2015  CLINICAL DATA:  Cough.  Smoker. EXAM: CHEST - 2 VIEW COMPARISON:  11/25/2008 FINDINGS: The heart size and mediastinal contours are within normal limits. There is no evidence of pulmonary edema, consolidation, pneumothorax, nodule or pleural fluid. The visualized skeletal structures are unremarkable. IMPRESSION: No active disease. Electronically Signed   By: Irish LackGlenn  Yamagata M.D.   On: 03/31/2015 13:46   I have personally reviewed and evaluated these images and lab results as part of my medical decision-making.   EKG Interpretation None      MDM   Final diagnoses:  Bronchitis  URI (upper respiratory infection)    39 year old female here with cough, congestion, headaches for past 3 weeks. No productivity to cough. No fever. No SOB. No CP. Here mild frontal sinus tenderness, normal oropharynx. Lungs clear. Will obtain CXR. Likely has URI. Patient also having some dysuria, will check a urinalysis.  Urine shows mild UTI, she's having urinary  symptoms, so will treat  - given macrobid. CXR ok. Given azithromycin for bronchitis, will cover for sinusitis also.  Elwin MochaBlair Tremell Reimers, MD 03/31/15 1434

## 2015-03-31 NOTE — ED Notes (Signed)
Pt laughing and talking on phone upon entering room. In NAD

## 2015-03-31 NOTE — ED Notes (Addendum)
Pt reports daily headaches x3 weeks with left ear pain, productive cough x1 week, white sputum, now having right ear pain as well. Right rib pain and lower back pain. 8/10. Pt has been taking sudafed, benadryl, and nasal spray with no relief.   Pt was told that the building she was staying in had mold found in it about 3.5 weeks ago.

## 2015-04-10 ENCOUNTER — Ambulatory Visit (INDEPENDENT_AMBULATORY_CARE_PROVIDER_SITE_OTHER): Payer: No Typology Code available for payment source | Admitting: Internal Medicine

## 2015-04-10 ENCOUNTER — Encounter (INDEPENDENT_AMBULATORY_CARE_PROVIDER_SITE_OTHER): Payer: Self-pay | Admitting: Internal Medicine

## 2015-04-10 VITALS — BP 97/60 | HR 75 | Temp 98.4°F

## 2015-04-10 DIAGNOSIS — H109 Unspecified conjunctivitis: Secondary | ICD-10-CM

## 2015-04-10 DIAGNOSIS — F988 Other specified behavioral and emotional disorders with onset usually occurring in childhood and adolescence: Secondary | ICD-10-CM

## 2015-04-10 MED ORDER — METHYLPHENIDATE HCL ER (OSM) 18 MG PO TBCR
18.0000 mg | EXTENDED_RELEASE_TABLET | Freq: Every day | ORAL | Status: DC
Start: 2015-04-10 — End: 2015-06-07

## 2015-04-10 MED ORDER — OFLOXACIN 0.3 % OP SOLN
1.0000 [drp] | Freq: Four times a day (QID) | OPHTHALMIC | Status: DC
Start: 2015-04-10 — End: 2016-11-03

## 2015-04-10 NOTE — Progress Notes (Signed)
Have you seen any new specialists/physicians since you were last here? no      Limb alert protocol reviewed?  Yes

## 2015-04-10 NOTE — Progress Notes (Signed)
Subjective:       Patient ID: Deborah Kirk is a 39 y.o. female developed red eyes 2 weeks ago. She was treated with olfoxacin with improvement, but redness has recurred after stopping antibiotic drops.   She need refill of Concerta for ADD.    HPI    The following portions of the patient's history were reviewed and updated as appropriate: allergies, current medications and problem list.    Review of Systems        Objective:    Physical Exam  BP 97/60 mmHg  Pulse 75  Temp(Src) 98.4 F (36.9 C) (Oral)  LMP 03/17/2015  Constitutional:  Alert, oriented X 3. NAD  Head    Atraumatic, normocephalic  Eye:   Slight conjunctival redness  Lymph:  No cervical or supraclavicular adenopathys  Psych:   Normal affect, thought content        Assessment:       1. Conjunctivitis of both eyes, unspecified conjunctivitis type  ofloxacin (OCUFLOX) 0.3 % ophthalmic solution   2. ADD (attention deficit disorder)              Plan:      Procedures    Renew eye drops. If persistent see ophthalmologist. Renew Concerta.

## 2015-06-06 ENCOUNTER — Encounter (INDEPENDENT_AMBULATORY_CARE_PROVIDER_SITE_OTHER): Payer: Self-pay | Admitting: Internal Medicine

## 2015-06-06 ENCOUNTER — Emergency Department (HOSPITAL_COMMUNITY)
Admission: EM | Admit: 2015-06-06 | Discharge: 2015-06-06 | Disposition: A | Discharge status: Home / Self Care | Payer: Self-pay | Attending: Emergency Medicine | Admitting: Emergency Medicine

## 2015-06-06 ENCOUNTER — Encounter (HOSPITAL_COMMUNITY): Payer: Self-pay | Admitting: Emergency Medicine

## 2015-06-06 ENCOUNTER — Emergency Department (HOSPITAL_COMMUNITY): Payer: Self-pay

## 2015-06-06 DIAGNOSIS — F419 Anxiety disorder, unspecified: Secondary | ICD-10-CM | POA: Insufficient documentation

## 2015-06-06 DIAGNOSIS — Z22330 Carrier of Group B streptococcus: Secondary | ICD-10-CM | POA: Insufficient documentation

## 2015-06-06 DIAGNOSIS — R0789 Other chest pain: Secondary | ICD-10-CM

## 2015-06-06 DIAGNOSIS — F17210 Nicotine dependence, cigarettes, uncomplicated: Secondary | ICD-10-CM | POA: Insufficient documentation

## 2015-06-06 DIAGNOSIS — J209 Acute bronchitis, unspecified: Secondary | ICD-10-CM | POA: Insufficient documentation

## 2015-06-06 DIAGNOSIS — R42 Dizziness and giddiness: Secondary | ICD-10-CM | POA: Insufficient documentation

## 2015-06-06 DIAGNOSIS — Z87440 Personal history of urinary (tract) infections: Secondary | ICD-10-CM | POA: Insufficient documentation

## 2015-06-06 DIAGNOSIS — R071 Chest pain on breathing: Secondary | ICD-10-CM | POA: Insufficient documentation

## 2015-06-06 DIAGNOSIS — Z87448 Personal history of other diseases of urinary system: Secondary | ICD-10-CM | POA: Insufficient documentation

## 2015-06-06 LAB — CBC WITH DIFFERENTIAL/PLATELET
BASOS ABS: 0 10*3/uL (ref 0.0–0.1)
Basophils Relative: 1 %
Eosinophils Absolute: 0.6 10*3/uL (ref 0.0–0.7)
Eosinophils Relative: 9 %
HEMATOCRIT: 43.8 % (ref 36.0–46.0)
Hemoglobin: 14.2 g/dL (ref 12.0–15.0)
Lymphocytes Relative: 29 %
Lymphs Abs: 1.9 10*3/uL (ref 0.7–4.0)
MCH: 31.6 pg (ref 26.0–34.0)
MCHC: 32.4 g/dL (ref 30.0–36.0)
MCV: 97.3 fL (ref 78.0–100.0)
Monocytes Absolute: 0.5 10*3/uL (ref 0.1–1.0)
Monocytes Relative: 7 %
NEUTROS ABS: 3.6 10*3/uL (ref 1.7–7.7)
Neutrophils Relative %: 54 %
PLATELETS: 166 10*3/uL (ref 150–400)
RBC: 4.5 MIL/uL (ref 3.87–5.11)
RDW: 12.7 % (ref 11.5–15.5)
WBC: 6.6 10*3/uL (ref 4.0–10.5)

## 2015-06-06 LAB — BASIC METABOLIC PANEL
Anion gap: 7 (ref 5–15)
BUN: 10 mg/dL (ref 6–20)
CALCIUM: 8.5 mg/dL — AB (ref 8.9–10.3)
CO2: 25 mmol/L (ref 22–32)
Chloride: 109 mmol/L (ref 101–111)
Creatinine, Ser: 0.74 mg/dL (ref 0.44–1.00)
Glucose, Bld: 98 mg/dL (ref 65–99)
POTASSIUM: 3.6 mmol/L (ref 3.5–5.1)
Sodium: 141 mmol/L (ref 135–145)

## 2015-06-06 MED ORDER — PREDNISONE 20 MG PO TABS: 40.0000 mg | ORAL_TABLET | Freq: Every day | ORAL | Status: DC

## 2015-06-06 MED ORDER — ALBUTEROL SULFATE (2.5 MG/3ML) 0.083% IN NEBU
5.0000 mg | INHALATION_SOLUTION | Freq: Once | RESPIRATORY_TRACT | Status: AC
  Administered 2015-06-06: 5 mg via RESPIRATORY_TRACT
  Filled 2015-06-06: qty 6

## 2015-06-06 MED ORDER — BENZONATATE 100 MG PO CAPS
100.0000 mg | ORAL_CAPSULE | Freq: Once | ORAL | Status: AC
  Administered 2015-06-06: 100 mg via ORAL
  Filled 2015-06-06: qty 1

## 2015-06-06 MED ORDER — NAPROXEN 500 MG PO TABS: 500.0000 mg | ORAL_TABLET | Freq: Two times a day (BID) | ORAL | Status: DC

## 2015-06-06 MED ORDER — ALBUTEROL SULFATE HFA 108 (90 BASE) MCG/ACT IN AERS
2.0000 | INHALATION_SPRAY | Freq: Once | RESPIRATORY_TRACT | Status: DC
  Filled 2015-06-06: qty 6.7

## 2015-06-06 MED ORDER — KETOROLAC TROMETHAMINE 30 MG/ML IJ SOLN
30.0000 mg | Freq: Once | INTRAMUSCULAR | Status: AC
  Administered 2015-06-06: 30 mg via INTRAVENOUS
  Filled 2015-06-06: qty 1

## 2015-06-06 MED ORDER — LORAZEPAM 2 MG/ML IJ SOLN
1.0000 mg | Freq: Once | INTRAMUSCULAR | Status: AC
  Administered 2015-06-06: 1 mg via INTRAVENOUS
  Filled 2015-06-06: qty 1

## 2015-06-06 MED ORDER — BENZONATATE 100 MG PO CAPS: 100.0000 mg | ORAL_CAPSULE | Freq: Three times a day (TID) | ORAL | Status: DC | PRN

## 2015-06-06 MED ORDER — METHYLPREDNISOLONE SODIUM SUCC 125 MG IJ SOLR
125.0000 mg | Freq: Once | INTRAMUSCULAR | Status: AC
  Administered 2015-06-06: 125 mg via INTRAVENOUS
  Filled 2015-06-06: qty 2

## 2015-06-06 NOTE — ED Notes (Signed)
Pt asked for a meal prior to ambulation. She was given a sandwich

## 2015-06-06 NOTE — ED Notes (Signed)
Bed: WA14 Expected date:  Expected time:  Means of arrival:  Comments: Short of breath 

## 2015-06-06 NOTE — ED Notes (Signed)
Pt ambulated in the hallway without assistance. O2 was 98% and pulse was 108

## 2015-06-06 NOTE — ED Provider Notes (Signed)
CSN: [id number]     Arrival date & time 06/06/15  0248 History   First MD Initiated Contact with Patient 06/06/15 0301     Chief Complaint  Patient presents with  . Shortness of Breath     (Consider location/radiation/quality/duration/timing/severity/associated sxs/prior Treatment) HPI Comments: 40 year old female with a history of depression, seasonal allergies, and kidney abscess presents to the emergency department for further evaluation of upper respiratory symptoms and shortness of breath. She reports experiencing nasal congestion as well as rhinorrhea and a cough productive of white sputum over the past week. This evening, the patient states that she awoke feeling short of breath. Patient was found to be hyperventilating upon EMS arrival. She denies any reported sick contacts or known fever. She felt lightheaded, but never experienced syncope. No vomiting or diarrhea. No medications taken prior to arrival. Patient is a one pack per day smoker  The history is provided by the patient. No language interpreter was used.    Past Medical History  Diagnosis Date  . 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 Depression   . Seasonal allergies   . Abnormal Pap smear   . Infection     UTI  . Kidney abscess    Past Surgical History  Procedure Laterality Date  . Colposcopy    . Wisdom tooth extraction    . Tubal ligation    . Anterior cruciate ligament repair      rt  . Urethral stint     Family History  Problem Relation Age of Onset  . Anesthesia problems Neg Hx   . Hypotension Neg Hx   . Malignant hyperthermia Neg Hx   . Pseudochol deficiency Neg Hx   . Cancer Father     48   Social History  Substance Use Topics  . Smoking status: Current Every Day Smoker -- 0.50 packs/day for 26 years    Types: Cigarettes  . Smokeless tobacco: Never Used  . Alcohol Use: 0.0 oz/week     Comment: occasional    OB History    Gravida Para Term Preterm AB TAB SAB  Ectopic Multiple Living   3 3 3       3       Review of Systems  Constitutional: Positive for chills. Negative for fever.  HENT: Positive for congestion and rhinorrhea.   Respiratory: Positive for cough and shortness of breath.   Cardiovascular: Positive for chest pain (central when coughing only).  Gastrointestinal: Negative for nausea and vomiting.  Neurological: Positive for light-headedness. Negative for syncope.  All other systems reviewed and are negative.   Allergies  Morphine and related and Lactose intolerance (gi)  Home Medications   Prior to Admission medications   Medication Sig Start Date End Date Taking? Authorizing Provider  ibuprofen (ADVIL,MOTRIN) 200 MG tablet Take 200 mg by mouth every 6 (six) hours as needed for moderate pain.   Yes Historical Provider, MD  Phenylephrine-Pheniramine-DM [facility] COLD & COUGH) 03-22-19 MG PACK Take 1 Package by mouth every 8 (eight) hours as needed (cold symptoms).   Yes Historical Provider, MD  azithromycin (ZITHROMAX Z-PAK) 250 MG tablet 2 po day one, then 1 daily x 4 days Patient not taking: Reported on 06/06/2015 03/31/15   Elwin Mocha, MD  nitrofurantoin, macrocrystal-monohydrate, (MACROBID) 100 MG capsule Take 1 capsule (100 mg total) by mouth 2 (two) times daily. X 5 days Patient not taking: Reported on 06/06/2015 03/31/15   Elwin Mocha, MD  BP 120/83 mmHg  Pulse 94  Temp(Src) 97.8 F (36.6 C) (Oral)  Resp 24  SpO2 96%  LMP 06/04/2015   Physical Exam  Constitutional: She is oriented to person, place, and time. She appears well-developed and well-nourished. No distress.  Nontoxic/nonseptic appearing  HENT:  Head: Normocephalic and atraumatic.  Eyes: Conjunctivae and EOM are normal. No scleral icterus.  Neck: Normal range of motion.  Cardiovascular: Normal rate, regular rhythm and intact distal pulses.   Pulmonary/Chest: Effort normal. No respiratory distress. She has wheezes. She has no rales.  Mild expiratory  wheezing in the right midlung field. Chest expansion symmetric. No tachypnea or dyspnea. No accessory muscle use. No rales or rhonchi.  Abdominal: Soft. She exhibits no distension. There is no tenderness. There is no rebound.  Soft, nontender  Musculoskeletal: Normal range of motion.  Neurological: She is alert and oriented to person, place, and time. She exhibits normal muscle tone. Coordination normal.  GCS 15. Patient speaking in full sentences. She is moving all of her extremities  Skin: Skin is warm and dry. No rash noted. She is not diaphoretic. No erythema. No pallor.  Psychiatric: Her behavior is normal. Her mood appears anxious.  Nursing note and vitals reviewed.   ED Course  Procedures (including critical care time) Labs Review Labs Reviewed  BASIC METABOLIC PANEL - Abnormal; Notable for the following:    Calcium 8.5 (*)    All other components within normal limits  CBC WITH DIFFERENTIAL/PLATELET    Imaging Review Dg Chest 2 View  06/06/2015  CLINICAL DATA:  Sudden onset of shortness of breath this morning. Mid chest pain. EXAM: CHEST  2 VIEW COMPARISON:  03/31/2015 FINDINGS: The cardiomediastinal contours are normal. Mild bronchial thickening. Pulmonary vasculature is normal. No consolidation, pleural effusion, or pneumothorax. No acute osseous abnormalities are seen. IMPRESSION: Mild bronchial thickening. Electronically Signed   By: Rubye OaksMelanie  Ehinger M.D.   On: 06/06/2015 03:51   I have personally reviewed and evaluated these images and lab results as part of my medical decision-making.   EKG Interpretation None       Medications  albuterol (PROVENTIL HFA;VENTOLIN HFA) 108 (90 Base) MCG/ACT inhaler 2 puff (2 puffs Inhalation Not Given 06/06/15 0524)  albuterol (PROVENTIL) (2.5 MG/3ML) 0.083% nebulizer solution 5 mg (5 mg Nebulization Given 06/06/15 0435)  ketorolac (TORADOL) 30 MG/ML injection 30 mg (30 mg Intravenous Given 06/06/15 0436)  methylPREDNISolone sodium succinate  (SOLU-MEDROL) 125 mg/2 mL injection 125 mg (125 mg Intravenous Given 06/06/15 0436)  benzonatate (TESSALON) capsule 100 mg (100 mg Oral Given 06/06/15 0436)  LORazepam (ATIVAN) injection 1 mg (1 mg Intravenous Given 06/06/15 0525)    MDM   Final diagnoses:  Acute bronchitis, unspecified organism  Costochondral chest pain    Pt presenting for SOB with nasal congestion, rhinorrhea, and cough x 1 week. CXR negative for acute infiltrate. Patient ambulatory in the ED without hypoxia; SpO2 >98%. Patients symptoms are consistent with URI, likely bronchitis of viral etiology. Discussed that antibiotics are not indicated for viral infections. Pt will be discharged with symptomatic treatment. Patient verbalizes understanding and is agreeable with plan. Pt is hemodynamically stable and in NAD prior to discharge.   Filed Vitals:   06/06/15 0248 06/06/15 0255 06/06/15 0342 06/06/15 0602  BP:  120/83  111/65  Pulse:  94 86 96  Temp:  97.8 F (36.6 C)    TempSrc:  Oral    Resp:  24 18 21   SpO2: 96% 96% 98% 96%  Antony Madura, PA-C 06/06/15 1610  Rolland Porter, MD 06/09/15 [phone]

## 2015-06-06 NOTE — Discharge Instructions (Signed)
Use an albuterol inhaler, 2 puffs every 4-6 hours, as needed for cough or shortness of breath. Take prednisone as prescribed and Tessalon as needed for cough. Take naproxen for chest pain as needed. Follow-up with your primary care doctor.  Acute Bronchitis Bronchitis is when the airways that extend from the windpipe into the lungs get red, puffy, and painful (inflamed). Bronchitis often causes thick spit (mucus) to develop. This leads to a cough. A cough is the most common symptom of bronchitis. In acute bronchitis, the condition usually begins suddenly and goes away over time (usually in 2 weeks). Smoking, allergies, and asthma can make bronchitis worse. Repeated episodes of bronchitis may cause more lung problems. HOME CARE  Rest.  Drink enough fluids to keep your pee (urine) clear or pale yellow (unless you need to limit fluids as told by your doctor).  Only take over-the-counter or prescription medicines as told by your doctor.  Avoid smoking and secondhand smoke. These can make bronchitis worse. If you are a smoker, think about using nicotine gum or skin patches. Quitting smoking will help your lungs heal faster.  Reduce the chance of getting bronchitis again by:  Washing your hands often.  Avoiding people with cold symptoms.  Trying not to touch your hands to your mouth, nose, or eyes.  Follow up with your doctor as told. GET HELP IF: Your symptoms do not improve after 1 week of treatment. Symptoms include:  Cough.  Fever.  Coughing up thick spit.  Body aches.  Chest congestion.  Chills.  Shortness of breath.  Sore throat. GET HELP RIGHT AWAY IF:   You have an increased fever.  You have chills.  You have severe shortness of breath.  You have bloody thick spit (sputum).  You throw up (vomit) often.  You lose too much body fluid (dehydration).  You have a severe headache.  You faint. MAKE SURE YOU:   Understand these instructions.  Will watch your  condition.  Will get help right away if you are not doing well or get worse.   This information is not intended to replace advice given to you by your health care provider. Make sure you discuss any questions you have with your health care provider.   Document Released: 11/05/2007 Document Revised: 01/19/2013 Document Reviewed: 11/09/2012 Elsevier Interactive Patient Education 2016 Elsevier Inc. Costochondritis Costochondritis is a condition in which the tissue (cartilage) that connects your ribs with your breastbone (sternum) becomes irritated. It causes pain in the chest and rib area. It usually goes away on its own over time. HOME CARE  Avoid activities that wear you out.  Do not strain your ribs. Avoid activities that use your:  Chest.  Belly.  Side muscles.  Put ice on the area for the first 2 days after the pain starts.  Put ice in a plastic bag.  Place a towel between your skin and the bag.  Leave the ice on for 20 minutes, 2-3 times a day.  Only take medicine as told by your doctor. GET HELP IF:  You have redness or puffiness (swelling) in the rib area.  Your pain does not go away with rest or medicine. GET HELP RIGHT AWAY IF:   Your pain gets worse.  You are very uncomfortable.  You have trouble breathing.  You cough up blood.  You start sweating or throwing up (vomiting).  You have a fever or lasting symptoms for more than 2-3 days.  You have a fever and your symptoms suddenly  get worse. MAKE SURE YOU:   Understand these instructions.  Will watch your condition.  Will get help right away if you are not doing well or get worse.   This information is not intended to replace advice given to you by your health care provider. Make sure you discuss any questions you have with your health care provider.   Document Released: 11/05/2007 Document Revised: 01/19/2013 Document Reviewed: 12/21/2012 Elsevier Interactive Patient Education 2016 [employer]  Inc.    Emergency Department Resource Guide 1) Find a Doctor and Pay Out of Pocket Although you won't have to find out who is covered by your insurance plan, it is a good idea to ask around and get recommendations. You will then need to call the office and see if the doctor you have chosen will accept you as a new patient and what types of options they offer for patients who are self-pay. Some doctors offer discounts or will set up payment plans for their patients who do not have insurance, but you will need to ask so you aren't surprised when you get to your appointment.  2) Contact Your Local Health Department Not all health departments have doctors that can see patients for sick visits, but many do, so it is worth a call to see if yours does. If you don't know where your local health department is, you can check in your phone book. The CDC also has a tool to help you locate your state's health department, and many state websites also have listings of all of their local health departments.  3) Find a Walk-in Clinic If your illness is not likely to be very severe or complicated, you may want to try a walk in clinic. These are popping up all over the country in pharmacies, drugstores, and shopping centers. They're usually staffed by nurse practitioners or physician assistants that have been trained to treat common illnesses and complaints. They're usually fairly quick and inexpensive. However, if you have serious medical issues or chronic medical problems, these are probably not your best option.  No Primary Care Doctor: - Call Health Connect at  [phone] - they can help you locate a primary care doctor that  accepts your insurance, provides certain services, etc. - Physician Referral Service- [phone]  Chronic Pain Problems: Organization         Address  Phone   Notes  [facility]  [phone] Patients need to be referred by their primary care doctor.   Medication  Assistance: Organization         Address  Phone   Notes  [facility] Medication [facility] [address] [place], [place] [address] [phone]) [phone] --Must be a resident of [facility] -- Must have NO insurance coverage whatsoever (no Medicaid/ Medicare, etc.) -- The pt. MUST have a primary care doctor that directs their care regularly and follows them in the community   MedAssist  [phone]   [employer]  [phone]    Agencies that provide inexpensive medical care: Organization         Address  Phone   Notes  [facility]  [phone]   [facility]    [phone]   [facility] [address] [place], [place] [address] [phone]   [facility] [place] [address], [place] [phone]) [phone]   Planned Parenthood    [phone]   [facility]    (  [phone]) [phone]   [facility]  [address], [place] Phone:  [phone]) [phone], Fax:  [phone] Hours of Operation:  9 am - 6 pm, M-F.  Also accepts Medicaid/Medicare and self-pay.  [facility]  [address], [place] Phone: [phone](336) [phone], Fax: [phone]. Hours of Operation:  8:30 am - 5:30 pm, M-F.  Also accepts Medicaid and self-pay.  [facility] [place] [address], [place] Phone: [phone](336) [phone]   [facility] [address], [place], [place] [phone], Ext. [phone] Mondays & Thursdays: 7-9 AM.  First 15 patients are seen on a first come, first serve basis.    Medicaid-accepting [facility] Providers:  Organization         Address  Phone   Notes  [facility] [address], [place] [phone] Also accepts self-pay patients.  [facility] [address], [place]  [phone]) [phone]   [facility] [address], [place]  [phone]) [phone]   [facility] [address] [place] [address], [place] [phone]) [phone]   Renaye RakersVeita Bland [address], [place]   [phone] Only accepts [place] Access [place] patients after they have their name applied to their card.   Self-Pay (no insurance) in [facility] [place]:  Organization         Address  Phone   Notes  Sickle Cell Patients, [facility] [address], [place] [phone]) [phone]   [facility] [address], [place] [phone]) [phone]   [facility]  [address], [place] [phone]   [facility]/Dr. Osei-Bonsu  [address] [place] [address], [place] or [phone], [place] [phone] Phone number for both [place] and [place] locations is the same.  [facility] [address], [place] [phone]) [phone]   [facility] [place] [address] [place] [address], [place] or [address] [phone]   [facility] [address], [place] [phone]) [phone], phone; [phone](336) [phone], fax Sees patients 1st and 3rd Saturday of every month.  Must not qualify for public or private insurance (i.e. Medicaid, Medicare, [place] Health Choice, Veterans' Benefits)  Household income should be no more than 200% of the poverty level The clinic cannot treat you if you are pregnant or think you are pregnant  Sexually transmitted diseases are not treated at the clinic.    Dental Care: Organization         Address  Phone  Notes  [facility] Department of [facility] [address], [place] [phone]) [phone] Accepts children up to age 42 who are enrolled in [place]icaid or [place] Health Choice; pregnant women with a Medicaid card; and children who have applied for Medicaid or [place] Health Choice, but were declined, whose parents can pay a reduced fee at time of service.  [facility]  Department of [facility] [place]  [address], [place] [phone] Accepts children up to age 42 who are enrolled in [place]icaid or [place] Health Choice; pregnant women with a Medicaid card; and children who have applied for Medicaid or [place] Health Choice, but were declined, whose parents can pay a reduced fee at time of service.  [facility]  [address] [place], [place] [phone] Patients are seen by appointment only. Walk-ins are not accepted. [facility] will see patients 15 years of age and older. Monday - Tuesday (8am-5pm) Most Wednesdays (8:30-5pm) $30 per visit, cash only  [facility] Access PROGRAM  [address], [facility] [phone] Patients are seen by appointment only. Walk-ins are not accepted. [facility] will see patients 64 years of age and older. One Wednesday Evening (Monthly: Volunteer Based).  $30 per visit, cash only  [employer]  [phone] for adults; Children under age 75, call Graduate Pediatric Dentistry at [phone]. Children aged 86-14, please call [phone] to request a pediatric application.  Dental services are provided in all areas of dental care including fillings, crowns and bridges, complete and partial dentures, implants, gum treatment, root canals, and extractions. Preventive care is also provided. Treatment is provided to both adults and children. Patients are selected via a lottery and there is often a waiting list.   [facility] [address] [place]  [phone] [email or web address]   [facility] [address] [place], [place] [phone], Ext. [phone] Second and Fourth Thursday of each month, opens at 6:30 AM; Clinic ends at 9 AM.  Patients are seen on a first-come first-served basis, and a limited number are seen during each clinic.   [facility]  [address] Ether [place], [place] [phone]    Eligibility Requirements You must have lived in [place], [place], or [place] counties for at least the last three months.   You cannot be eligible for state or federal sponsored [place], including [employer], [place], or [employer].   You generally cannot be eligible for healthcare insurance through your employer.    How to apply: Eligibility screenings are held every Tuesday and Wednesday afternoon from 1:00 pm until 4:00 pm. You do not need an appointment for the interview!  [facility] [address], [place], [place] [phone]   [facility] Health Department  [phone]   [facility] Health Department  [phone]   [facility] Health Department  [phone]    Behavioral Health Resources in the Community: Intensive Outpatient Programs Organization         Address  Phone  Notes  [facility] [address] [place], [place] [phone]   [facility]patient [address], [place], [place] [phone]   ADS: Alcohol & Drug Svcs [address], [place], [place]  [phone]   [facility] Mental Health [address],  [place], [place] [phone] or [phone]   Substance Abuse Resources Organization         Address  Phone  Notes  [facility]cohol [facility] Drug Services  [phone]   [facility]  [phone]   [facility] [place]  [phone]   Floydene Flock  [phone]   Residential & Outpatient Substance Abuse Program  [phone]   Psychological Services Organization         Address  Phone  Notes  [facility] Behavioral Health  [phone]   [facility]  [phone]   [facility] Mental Health [address], [place] [phone] or [phone]    Mobile Crisis Teams Organization         Address  Phone  Notes  [facility], Mobile Crisis Care Unit  [phone]   [facility]  [address]  [place],  [place] [phone]   [facility] [address] [place] [phone]    Self-Help/Support Groups Organization         Address  Phone             Notes  Mental Health Assoc. of [place] - variety of support groups  336- [phone] Call for more information  Narcotics Anonymous (NA), [facility] [address], [place]  2 meetings at this location   Statisticianesidential Treatment Programs Organization         Address  Phone  Notes  [facility] [address],    [place]  [phone]   [facility]  [address], [place] [phone], [place], [place] [phone]   [facility] [address], [place] [phone] Admissions: 8am-3pm M-F  [facility] [address]    [place], [place] [phone]   [facility] [address], [place], [place] [phone]   [facility] [address]  [place], [place] [phone]   [facility] - Intensive Outpatient [address], [place], [place] [phone]   [facility] ([facility]iction Recovery [facility] [address]  [place], [place] [phone] or [phone]   [facility] ([facility]) [address] [place], [place] [phone] Accepts Medicaid  [facility] [address]  [place] [phone] Substance Abuse/Addiction Treatment   [facility] Behavioral Health Resources Organization         Address  Phone  Notes  [facility]  [phone]) [phone]   Angie FavaJulie Brannon, PhD [address], [address] [place], [place]   [phone]) [phone] or [phone](336) [phone]   [facility]   [address] [place], [place] [phone](336) [phone]   [facility] [address], [place], [place] [phone](336) [phone] Insurance/Medicaid/sponsorship through [facility]  Faith and Families [address]                                    [place], [place] [phone](336) [phone] Therapy/tele-psych/case    [facility] [address].   [place], [place] [phone]) [phone]    Dr. Lolly MustacheArfeen  [phone](336) [phone]   [facility]  [employer] [facility]. 1) [address],  2) [address], [place] 3)  [address], [place] [phone]) [phone]) [phone]  [phone]) [phone]   [facility] Child Abuse Hotline [phone](336) [phone] or [phone](336) 2493802912 (After Hours)

## 2015-06-06 NOTE — ED Notes (Signed)
Pt here via EMS with c/o sob. Per EMS pt was hyperventilating and reporting white sputum. Pt states she has been feeling this way x 1 week

## 2015-06-06 NOTE — ED Notes (Signed)
Pt tearful upon assessment. She states her pain is 5/10 in her ribs.

## 2015-06-07 ENCOUNTER — Other Ambulatory Visit (INDEPENDENT_AMBULATORY_CARE_PROVIDER_SITE_OTHER): Payer: Self-pay

## 2015-06-07 MED ORDER — METHYLPHENIDATE HCL ER (OSM) 18 MG PO TBCR
18.0000 mg | EXTENDED_RELEASE_TABLET | Freq: Every day | ORAL | Status: DC
Start: 2015-06-07 — End: 2015-06-08

## 2015-06-07 NOTE — Telephone Encounter (Signed)
Please re prescribe 30 day supply for concerta since insurance will not cover 90 day supply please. Thanks. This Rx also needs prior authorization-please see email.

## 2015-06-08 ENCOUNTER — Other Ambulatory Visit (INDEPENDENT_AMBULATORY_CARE_PROVIDER_SITE_OTHER): Payer: Self-pay | Admitting: Internal Medicine

## 2015-06-08 MED ORDER — METHYLPHENIDATE HCL ER (OSM) 18 MG PO TBCR
18.0000 mg | EXTENDED_RELEASE_TABLET | Freq: Every day | ORAL | Status: DC
Start: 2015-06-08 — End: 2015-06-08

## 2015-06-08 MED ORDER — METHYLPHENIDATE HCL ER (OSM) 18 MG PO TBCR
18.0000 mg | EXTENDED_RELEASE_TABLET | Freq: Every day | ORAL | Status: DC
Start: 2015-06-08 — End: 2015-08-29

## 2015-06-08 NOTE — Telephone Encounter (Signed)
Pre-authorization in process.

## 2015-06-11 ENCOUNTER — Encounter (INDEPENDENT_AMBULATORY_CARE_PROVIDER_SITE_OTHER): Payer: Self-pay | Admitting: Internal Medicine

## 2015-06-12 ENCOUNTER — Encounter (INDEPENDENT_AMBULATORY_CARE_PROVIDER_SITE_OTHER): Payer: Self-pay

## 2015-06-12 ENCOUNTER — Telehealth (INDEPENDENT_AMBULATORY_CARE_PROVIDER_SITE_OTHER): Payer: Self-pay

## 2015-06-12 ENCOUNTER — Encounter (INDEPENDENT_AMBULATORY_CARE_PROVIDER_SITE_OTHER): Payer: Self-pay | Admitting: Internal Medicine

## 2015-06-12 NOTE — Telephone Encounter (Signed)
Called pt to advise prior auth for concerta faxed to script care. (form scanned in Epic)  LM to call back.

## 2015-06-12 NOTE — Telephone Encounter (Signed)
This is a duplicate message, closing encounter.  Please refer to earlier encounter.

## 2015-06-12 NOTE — Telephone Encounter (Signed)
I call insurance to fax PA- Form over to be completed, since that is the only way to process PA with insurance.  They will be faxing it over today.

## 2015-06-12 NOTE — Telephone Encounter (Signed)
Pt called in regards to PA for Concerta. Script [facility] states that a Dx code for F98.8 will not work for the PA. When filling out a new PA or contacting [facility] please note that the PA would only be approved for Dx code for ADD.     Please let pt know once it is approved.      -Anastasija Anfinson

## 2015-06-12 NOTE — Telephone Encounter (Signed)
Pt called back to ask for PA for her concerta to be resubmitted with correct code because it was rejected.    Pt advised to give 24-48hrs for such to be process will call pt, when done.    [phone], please refer to Email encounter from 06/05/05

## 2015-06-12 NOTE — Telephone Encounter (Signed)
[facility] Clinical prior authorization request form for concerta signed and faxed to [phone].

## 2015-06-13 NOTE — Telephone Encounter (Signed)
Pt sent  mychart message to inform office that PA was approved and that she was able to pick up medication.  Please refer to email encounter 06/13/15.

## 2015-08-03 ENCOUNTER — Inpatient Hospital Stay (HOSPITAL_COMMUNITY)
Admission: AD | Admit: 2015-08-03 | Discharge: 2015-08-03 | Disposition: A | Discharge status: Home / Self Care | Payer: Self-pay | Source: Ambulatory Visit | Attending: Obstetrics & Gynecology | Admitting: Obstetrics & Gynecology

## 2015-08-03 ENCOUNTER — Encounter (HOSPITAL_COMMUNITY): Payer: Self-pay | Admitting: *Deleted

## 2015-08-03 DIAGNOSIS — Z87891 Personal history of nicotine dependence: Secondary | ICD-10-CM | POA: Insufficient documentation

## 2015-08-03 DIAGNOSIS — N760 Acute vaginitis: Secondary | ICD-10-CM | POA: Insufficient documentation

## 2015-08-03 DIAGNOSIS — M549 Dorsalgia, unspecified: Secondary | ICD-10-CM | POA: Insufficient documentation

## 2015-08-03 DIAGNOSIS — B9689 Other specified bacterial agents as the cause of diseases classified elsewhere: Secondary | ICD-10-CM

## 2015-08-03 DIAGNOSIS — F329 Major depressive disorder, single episode, unspecified: Secondary | ICD-10-CM | POA: Insufficient documentation

## 2015-08-03 DIAGNOSIS — R109 Unspecified abdominal pain: Secondary | ICD-10-CM | POA: Insufficient documentation

## 2015-08-03 DIAGNOSIS — N898 Other specified noninflammatory disorders of vagina: Secondary | ICD-10-CM | POA: Insufficient documentation

## 2015-08-03 DIAGNOSIS — Z113 Encounter for screening for infections with a predominantly sexual mode of transmission: Secondary | ICD-10-CM | POA: Insufficient documentation

## 2015-08-03 DIAGNOSIS — A499 Bacterial infection, unspecified: Secondary | ICD-10-CM

## 2015-08-03 LAB — WET PREP, GENITAL
SPERM: NONE SEEN
TRICH WET PREP: NONE SEEN
WBC, Wet Prep HPF POC: NONE SEEN
YEAST WET PREP: NONE SEEN

## 2015-08-03 LAB — CBC
HEMATOCRIT: 42.4 % (ref 36.0–46.0)
HEMOGLOBIN: 14.1 g/dL (ref 12.0–15.0)
MCH: 31 pg (ref 26.0–34.0)
MCHC: 33.3 g/dL (ref 30.0–36.0)
MCV: 93.2 fL (ref 78.0–100.0)
Platelets: 198 10*3/uL (ref 150–400)
RBC: 4.55 MIL/uL (ref 3.87–5.11)
RDW: 12.6 % (ref 11.5–15.5)
WBC: 8.3 10*3/uL (ref 4.0–10.5)

## 2015-08-03 LAB — COMPREHENSIVE METABOLIC PANEL
ALT: 27 U/L (ref 14–54)
ANION GAP: 6 (ref 5–15)
AST: 23 U/L (ref 15–41)
Albumin: 4.1 g/dL (ref 3.5–5.0)
Alkaline Phosphatase: 98 U/L (ref 38–126)
BUN: 11 mg/dL (ref 6–20)
CALCIUM: 8.8 mg/dL — AB (ref 8.9–10.3)
CHLORIDE: 106 mmol/L (ref 101–111)
CO2: 25 mmol/L (ref 22–32)
CREATININE: 0.62 mg/dL (ref 0.44–1.00)
Glucose, Bld: 130 mg/dL — ABNORMAL HIGH (ref 65–99)
Potassium: 4.1 mmol/L (ref 3.5–5.1)
SODIUM: 137 mmol/L (ref 135–145)
Total Bilirubin: 0.7 mg/dL (ref 0.3–1.2)
Total Protein: 7.2 g/dL (ref 6.5–8.1)

## 2015-08-03 LAB — URINALYSIS, ROUTINE W REFLEX MICROSCOPIC
Bilirubin Urine: NEGATIVE
Glucose, UA: NEGATIVE mg/dL
Ketones, ur: NEGATIVE mg/dL
Leukocytes, UA: NEGATIVE
NITRITE: NEGATIVE
PROTEIN: NEGATIVE mg/dL
Specific Gravity, Urine: 1.01 (ref 1.005–1.030)
pH: 7 (ref 5.0–8.0)

## 2015-08-03 LAB — URINE MICROSCOPIC-ADD ON: WBC, UA: NONE SEEN WBC/hpf (ref 0–5)

## 2015-08-03 LAB — POCT PREGNANCY, URINE: Preg Test, Ur: NEGATIVE

## 2015-08-03 MED ORDER — METRONIDAZOLE 500 MG PO TABS: 500.0000 mg | ORAL_TABLET | Freq: Two times a day (BID) | ORAL | Status: DC

## 2015-08-03 NOTE — MAU Provider Note (Signed)
History     CSN: [id number]  Arrival date and time: 08/03/15 1419   First Provider Initiated Contact with Patient 08/03/15 1517        Chief Complaint  Patient presents with  . Back Pain  . Abdominal Pain  . Vaginal Discharge   HPI Comments: Crystal Hernandez is a 40 y.o. Female who presents for vaginal discharge & abdominal cramping. Symptoms started 2 days ago. Reports creamy white discharge with foul odor. Denies vaginal irritation or itching. Is sexually active and desires STD testing. Also reports abdominal cramping. Lower abdomen, comes & goes x 2 days. Currently in no pain. When pain comes it is cramp like. No treatment.  Denies n/v/d, constipation, fever, dyspareunia, or post coital bleeding.     OB History    Gravida Para Term Preterm AB TAB SAB Ectopic Multiple Living   Past Medical History  Diagnosis Date  . 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 Depression   . Seasonal allergies   . Abnormal Pap smear   . Infection     UTI  . Kidney abscess     Past Surgical History  Procedure Laterality Date  . Colposcopy    . Wisdom tooth extraction    . Tubal ligation    . Anterior cruciate ligament repair      rt  . Urethral stint      Family History  Problem Relation Age of Onset  . Anesthesia problems Neg Hx   . Hypotension Neg Hx   . Malignant hyperthermia Neg Hx   . Pseudochol deficiency Neg Hx   . Cancer Father     70    Social History  Substance Use Topics  . Smoking status: Former Smoker -- 0.50 packs/day for 26 years    Types: Cigarettes  . Smokeless tobacco: Never Used  . Alcohol Use: 0.0 oz/week     Comment: occasional     Allergies:  Allergies  Allergen Reactions  . Morphine And Related Itching    Had severe itching and was given benadryl.   . Lactose Intolerance (Gi)     Gi upset     No prescriptions prior to admission    Review of Systems  Constitutional: Negative.    Gastrointestinal: Positive for abdominal pain. Negative for nausea, vomiting, diarrhea and constipation.  Genitourinary: Negative for dysuria, frequency and flank pain.       + vaginal discharge No vaginal bleeding, post coital bleeding, or dyspareunia   Physical Exam   Blood pressure 143/88, pulse 91, temperature 98.4 F (36.9 C), resp. rate 18, last menstrual period 07/06/2015.  Physical Exam  Nursing note and vitals reviewed. Constitutional: She is oriented to person, place, and time. She appears well-developed and well-nourished. No distress.  HENT:  Head: Normocephalic and atraumatic.  Eyes: Conjunctivae are normal. Right eye exhibits no discharge. Left eye exhibits no discharge. No scleral icterus.  Neck: Normal range of motion.  Cardiovascular: Normal rate, regular rhythm and normal heart sounds.   No murmur heard. Respiratory: Effort normal and breath sounds normal. No respiratory distress. She has no wheezes.  GI: Soft. Bowel sounds are normal. She exhibits no distension. There is no tenderness.  Genitourinary: Uterus normal. Cervix exhibits no motion tenderness and no friability. Right adnexum displays no tenderness. Left adnexum displays no tenderness. No bleeding in the vagina. Vaginal discharge (  small amount of thin white frothy discharge) found.  Neurological: She is alert and oriented to person, place, and time.  Skin: Skin is warm and dry. She is not diaphoretic.  Psychiatric: She has a normal mood and affect. Her behavior is normal. Judgment and thought content normal.    MAU Course  Procedures Results for orders placed or performed during the hospital encounter of 08/03/15 (from the past 24 hour(s))  Urinalysis, Routine w reflex microscopic (not at [facility])     Status: Abnormal   Collection Time: 08/03/15  2:30 PM  Result Value Ref Range   Color, Urine YELLOW YELLOW   APPearance CLEAR CLEAR   Specific Gravity, Urine 1.010 1.005 - 1.030   pH 7.0 5.0 - 8.0    Glucose, UA NEGATIVE NEGATIVE mg/dL   Hgb urine dipstick TRACE (A) NEGATIVE   Bilirubin Urine NEGATIVE NEGATIVE   Ketones, ur NEGATIVE NEGATIVE mg/dL   Protein, ur NEGATIVE NEGATIVE mg/dL   Nitrite NEGATIVE NEGATIVE   Leukocytes, UA NEGATIVE NEGATIVE  Urine microscopic-add on     Status: Abnormal   Collection Time: 08/03/15  2:30 PM  Result Value Ref Range   Squamous Epithelial / LPF 0-5 (A) NONE SEEN   WBC, UA NONE SEEN 0 - 5 WBC/hpf   RBC / HPF 0-5 0 - 5 RBC/hpf   Bacteria, UA RARE (A) NONE SEEN  Pregnancy, urine POC     Status: None   Collection Time: 08/03/15  2:47 PM  Result Value Ref Range   Preg Test, Ur NEGATIVE NEGATIVE  Wet prep, genital     Status: Abnormal   Collection Time: 08/03/15  3:45 PM  Result Value Ref Range   Yeast Wet Prep HPF POC NONE SEEN NONE SEEN   Trich, Wet Prep NONE SEEN NONE SEEN   Clue Cells Wet Prep HPF POC PRESENT (A) NONE SEEN   WBC, Wet Prep HPF POC NONE SEEN NONE SEEN   Sperm NONE SEEN   CBC     Status: None   Collection Time: 08/03/15  3:49 PM  Result Value Ref Range   WBC 8.3 4.0 - 10.5 K/uL   RBC 4.55 3.87 - 5.11 MIL/uL   Hemoglobin 14.1 12.0 - 15.0 g/dL   HCT 16.142.4 09.636.0 - 04.546.0 %   MCV 93.2 78.0 - 100.0 fL   MCH 31.0 26.0 - 34.0 pg   MCHC 33.3 30.0 - 36.0 g/dL   RDW 40.912.6 81.111.5 - 91.415.5 %   Platelets 198 150 - 400 K/uL  Comprehensive metabolic panel     Status: Abnormal   Collection Time: 08/03/15  3:49 PM  Result Value Ref Range   Sodium 137 135 - 145 mmol/L   Potassium 4.1 3.5 - 5.1 mmol/L   Chloride 106 101 - 111 mmol/L   CO2 25 22 - 32 mmol/L   Glucose, Bld 130 (H) 65 - 99 mg/dL   BUN 11 6 - 20 mg/dL   Creatinine, Ser 7.820.62 0.44 - 1.00 mg/dL   Calcium 8.8 (L) 8.9 - 10.3 mg/dL   Total Protein 7.2 6.5 - 8.1 g/dL   Albumin 4.1 3.5 - 5.0 g/dL   AST 23 15 - 41 U/L   ALT 27 14 - 54 U/L   Alkaline Phosphatase 98 38 - 126 U/L   Total Bilirubin 0.7 0.3 - 1.2 mg/dL   GFR calc non Af Amer >60 >60 mL/min   GFR calc Af Amer >60 >60  mL/min   Anion gap 6 5 -  15   MDM UPT negative STD testing per patient request - CBC, HIV, RPR, wet prep, GC/CT Pt concerned about kidney infection dt history but denies any symptoms at this time. CMP for kidney function & will send urine for culture.  Assessment and Plan  A: 1. BV (bacterial vaginosis)   2. Screen for STD (sexually transmitted disease)    P: Discharge home Rx flagyl (no alcohol) GC/CT, HIV, RPR, urine culture pending  Judeth Horn 08/03/2015, 3:16 PM

## 2015-08-03 NOTE — Discharge Instructions (Signed)
Bacterial Vaginosis Bacterial vaginosis is a vaginal infection that occurs when the normal balance of bacteria in the vagina is disrupted. It results from an overgrowth of certain bacteria. This is the most common vaginal infection in women of childbearing age. Treatment is important to prevent complications, especially in pregnant women, as it can cause a premature delivery. CAUSES  Bacterial vaginosis is caused by an increase in harmful bacteria that are normally present in smaller amounts in the vagina. Several different kinds of bacteria can cause bacterial vaginosis. However, the reason that the condition develops is not fully understood. RISK FACTORS Certain activities or behaviors can put you at an increased risk of developing bacterial vaginosis, including:  Having a new sex partner or multiple sex partners.  Douching.  Using an intrauterine device (IUD) for contraception. Women do not get bacterial vaginosis from toilet seats, bedding, swimming pools, or contact with objects around them. SIGNS AND SYMPTOMS  Some women with bacterial vaginosis have no signs or symptoms. Common symptoms include:  Grey vaginal discharge.  A fishlike odor with discharge, especially after sexual intercourse.  Itching or burning of the vagina and vulva.  Burning or pain with urination. DIAGNOSIS  Your health care provider will take a medical history and examine the vagina for signs of bacterial vaginosis. A sample of vaginal fluid may be taken. Your health care provider will look at this sample under a microscope to check for bacteria and abnormal cells. A vaginal pH test may also be done.  TREATMENT  Bacterial vaginosis may be treated with antibiotic medicines. These may be given in the form of a pill or a vaginal cream. A second round of antibiotics may be prescribed if the condition comes back after treatment. Because bacterial vaginosis increases your risk for sexually transmitted diseases, getting  treated can help reduce your risk for chlamydia, gonorrhea, HIV, and herpes. HOME CARE INSTRUCTIONS   Only take over-the-counter or prescription medicines as directed by your health care provider.  If antibiotic medicine was prescribed, take it as directed. Make sure you finish it even if you start to feel better.  During treatment, it is important that you follow these instructions:  Avoid sexual activity or use condoms correctly.  Do not douche.  Avoid alcohol as directed by your health care provider.  Avoid breastfeeding as directed by your health care provider. SEEK MEDICAL CARE IF:   Your symptoms are not improving after 3 days of treatment.  You have increased discharge or pain.  You have a fever. MAKE SURE YOU:   Understand these instructions.  Will watch your condition.  Will get help right away if you are not doing well or get worse. FOR MORE INFORMATION  American Sexual Health Association (ASHA): www.ashastd.org    This information is not intended to replace advice given to you by your health care provider. Make sure you discuss any questions you have with your health care provider.   Document Released: 05/19/2005 Document Revised: 06/09/2014 Document Reviewed: 12/29/2012 Elsevier Interactive Patient Education [employer]2016 [employer]vier Inc.

## 2015-08-03 NOTE — MAU Note (Signed)
Pt presents to MAU with complaints of lower back pain, lower abdominal pain and vaginal discharge with an odor for approximately a  week

## 2015-08-04 LAB — HIV ANTIBODY (ROUTINE TESTING W REFLEX): HIV SCREEN 4TH GENERATION: NONREACTIVE

## 2015-08-04 LAB — RPR: RPR: NONREACTIVE

## 2015-08-05 LAB — URINE CULTURE

## 2015-08-06 LAB — GC/CHLAMYDIA PROBE AMP (~~LOC~~) NOT AT ARMC
Chlamydia: NEGATIVE
Neisseria Gonorrhea: NEGATIVE

## 2015-08-29 ENCOUNTER — Other Ambulatory Visit (INDEPENDENT_AMBULATORY_CARE_PROVIDER_SITE_OTHER): Payer: Self-pay | Admitting: Internal Medicine

## 2015-08-29 NOTE — Telephone Encounter (Signed)
Patient requesting refills on Concerta 18mg  CR and Wellbutrin 100mg 

## 2015-08-30 MED ORDER — METHYLPHENIDATE HCL ER (OSM) 18 MG PO TBCR
18.0000 mg | EXTENDED_RELEASE_TABLET | Freq: Every day | ORAL | Status: DC
Start: 2015-08-30 — End: 2016-07-24

## 2015-08-30 MED ORDER — BUPROPION HCL 100 MG PO TABS
100.0000 mg | ORAL_TABLET | Freq: Two times a day (BID) | ORAL | Status: DC
Start: 2015-08-30 — End: 2015-11-30

## 2015-10-10 ENCOUNTER — Encounter (INDEPENDENT_AMBULATORY_CARE_PROVIDER_SITE_OTHER): Payer: Self-pay | Admitting: Student in an Organized Health Care Education/Training Program

## 2015-10-10 ENCOUNTER — Ambulatory Visit (FREE_STANDING_LABORATORY_FACILITY): Payer: 59 | Admitting: Student in an Organized Health Care Education/Training Program

## 2015-10-10 VITALS — BP 103/64 | HR 83 | Temp 98.7°F | Resp 15 | Wt 131.2 lb

## 2015-10-10 DIAGNOSIS — L659 Nonscarring hair loss, unspecified: Secondary | ICD-10-CM

## 2015-10-10 DIAGNOSIS — F988 Other specified behavioral and emotional disorders with onset usually occurring in childhood and adolescence: Secondary | ICD-10-CM

## 2015-10-10 DIAGNOSIS — N83202 Unspecified ovarian cyst, left side: Secondary | ICD-10-CM

## 2015-10-10 DIAGNOSIS — R41840 Attention and concentration deficit: Secondary | ICD-10-CM

## 2015-10-10 LAB — COMPREHENSIVE METABOLIC PANEL
ALT: 13 U/L (ref 0–55)
AST (SGOT): 19 U/L (ref 5–34)
Albumin/Globulin Ratio: 1.2 (ref 0.9–2.2)
Albumin: 4.2 g/dL (ref 3.5–5.0)
Alkaline Phosphatase: 51 U/L (ref 37–106)
BUN: 11 mg/dL (ref 7.0–19.0)
Bilirubin, Total: 0.6 mg/dL (ref 0.1–1.2)
CO2: 26 mEq/L (ref 21–30)
Calcium: 9.7 mg/dL (ref 8.5–10.5)
Chloride: 106 mEq/L (ref 100–111)
Creatinine: 0.9 mg/dL (ref 0.4–1.5)
Globulin: 3.5 g/dL (ref 2.0–3.7)
Glucose: 80 mg/dL (ref 70–100)
Potassium: 4.5 mEq/L (ref 3.5–5.3)
Protein, Total: 7.7 g/dL (ref 6.0–8.3)
Sodium: 139 mEq/L (ref 135–146)

## 2015-10-10 LAB — PROLACTIN: Prolactin: 7.3 ng/mL (ref 5.2–26.5)

## 2015-10-10 LAB — CBC AND DIFFERENTIAL
Basophils Absolute Automated: 0.03 10*3/uL (ref 0.00–0.20)
Basophils Automated: 0 %
Eosinophils Absolute Automated: 0.11 10*3/uL (ref 0.00–0.70)
Eosinophils Automated: 2 %
Hematocrit: 37.8 % (ref 37.0–47.0)
Hgb: 12.8 g/dL (ref 12.0–16.0)
Immature Granulocytes Absolute: 0.01 10*3/uL
Immature Granulocytes: 0 %
Lymphocytes Absolute Automated: 1.66 10*3/uL (ref 0.50–4.40)
Lymphocytes Automated: 22 %
MCH: 30.1 pg (ref 28.0–32.0)
MCHC: 33.9 g/dL (ref 32.0–36.0)
MCV: 88.9 fL (ref 80.0–100.0)
MPV: 9.6 fL (ref 9.4–12.3)
Monocytes Absolute Automated: 0.44 10*3/uL (ref 0.00–1.20)
Monocytes: 6 %
Neutrophils Absolute: 5.14 10*3/uL (ref 1.80–8.10)
Neutrophils: 70 %
Nucleated RBC: 0 /100 WBC (ref 0–1)
Platelets: 439 10*3/uL — ABNORMAL HIGH (ref 140–400)
RBC: 4.25 10*6/uL (ref 4.20–5.40)
RDW: 13 % (ref 12–15)
WBC: 7.39 10*3/uL (ref 3.50–10.80)

## 2015-10-10 LAB — VITAMIN D,25 OH,TOTAL: Vitamin D, 25 OH, Total: 72 ng/mL (ref 30–100)

## 2015-10-10 LAB — T3, FREE: T3, Free: 2.84 pg/mL (ref 1.71–3.71)

## 2015-10-10 LAB — FERRITIN: Ferritin: 39.42 ng/mL (ref 4.60–204.00)

## 2015-10-10 LAB — TSH: TSH: 0.55 u[IU]/mL (ref 0.35–4.94)

## 2015-10-10 LAB — T4, FREE: T4 Free: 0.94 ng/dL (ref 0.70–1.48)

## 2015-10-10 LAB — PROGESTERONE: Progesterone: 8.4 ng/mL

## 2015-10-10 LAB — GFR: EGFR: >60

## 2015-10-10 LAB — LUTEINIZING HORMONE: LH: 2.7

## 2015-10-10 LAB — VITAMIN B12: Vitamin B-12: 1585 pg/mL — ABNORMAL HIGH (ref 211–911)

## 2015-10-10 LAB — HEMOLYSIS INDEX: Hemolysis Index: 2 (ref 0–18)

## 2015-10-10 LAB — ESTRADIOL: Estradiol: 90 pg/mL

## 2015-10-10 LAB — FOLLICLE STIMULATING HORMONE: Follicle Stimulating Hormone: 2.82

## 2015-10-10 MED ORDER — METHYLPHENIDATE HCL ER (OSM) 18 MG PO TBCR
18.0000 mg | EXTENDED_RELEASE_TABLET | Freq: Every day | ORAL | Status: DC
Start: 2015-10-10 — End: 2015-11-30

## 2015-10-10 MED ORDER — METHYLPHENIDATE HCL ER (OSM) 18 MG PO TBCR
18.0000 mg | EXTENDED_RELEASE_TABLET | Freq: Every morning | ORAL | Status: DC
Start: 2015-10-10 — End: 2015-11-30

## 2015-10-10 NOTE — Progress Notes (Signed)
Have you seen any new specialist/physicians since you were last here?    No       Limb alert protocol reviewed? Yes or No   Yes

## 2015-10-10 NOTE — Patient Instructions (Signed)
Ovarian Cysts    Ovarian cysts are sacs filled with fluid or tissue that form on or inside the ovaries. The ovaries are two small organs located on each side of a woman's uterus (womb). They are part of the female reproductive system.  Ovarian cysts are common in women, especially during childbearing years. There are different types of cysts. Most are harmless (benign) and go away on their own. They often cause no symptoms. If symptoms do occur, they can include mild pain or pressure in the lower belly (abdomen).  Cysts that are large or break (rupture) may cause more severe pain and symptoms. In these cases, hospital care or treatment such as surgery may be needed. More extensive treatment may also be needed if a cyst causes an ovary to twist (called torsion) or if a cyst is suspected to be cancerous. Keep in mind that most cysts are not cancerous, however.  General care   To help relieve pain, your healthcare provider may recommend using over-the-counter pain medicine. If needed, stronger pain medicine may be prescribed.   Depending on the type of cyst you have, your healthcare provider may advise taking birth control pills. These help shrink cysts in certain cases. They may also help prevent new cysts from forming. Be sure to take these medicines as directed if they are prescribed.   Your healthcare provider may advise you to watch your symptoms over time to see if they go away or worsen. Regular ultrasound tests may also be advised. These can help check if a cyst goes away or grows in size.  Follow-up care  Follow up with your healthcare provider, or as advised.  When to seek medical advice  Call your healthcare provider right away if any of these occur:   Pain worsens or fails to get better with home treatment   Fever of 100.4F (38C) or higher (or other fever amount directed by your healthcare provider)   Nausea and vomiting   Weakness, dizziness, or fainting   Abnormal vaginal bleeding  Date Last  Reviewed: 11/10/2013   2000-2016 [employer] [address], [place] [address]. All rights reserved. This information is not intended as a substitute for professional medical care. Always follow your healthcare professional's instructions.

## 2015-10-13 LAB — TESTOSTERONE, FREE, TOTAL
Testosterone Free: 2.3 pg/mL (ref 0.1–6.4)
Testosterone Total MS: 31 ng/dL (ref 2–45)

## 2015-10-14 NOTE — Progress Notes (Signed)
CC: chronic ADD for refills    Patient is on long term use of  Medication for ADHD  ADD:  Changing PCP?   Continues on Concerta 18 .  Tolerating well, no adverse drug reactions related to Concerta since she has been on it for long time .  Taking daily as directed.  Symptoms of inattention, trouble focusing, finishing tasks all well controlled.  No other specific concerns today.     Denies any  Weight loss ,  Abdominal pain  Loss of appetite  Dry mouth, Headache  Insomnia ,   She is  Feeling nervous, had 2 periods this months attributed to hormonal imbalance, seen by gyn in the past for L ovarian cyst , advised by her PCP to do nothing    No SOB, No Palpitations, headache  Reviewed BP and HR which is normal here today  BP Readings from Last 3 Encounters:   10/10/15 103/64   04/10/15 97/60   03/29/15 109/73       No h/o substance abuse   She c/o long standing hair loss , thought to be hormonal, seen by derm numerous times       Past Medical History   Diagnosis Date   . Attention deficit hyperactivity disorder (ADHD)    . Vaginal yeast infection      chronic   . Anxiety       Past Surgical History   Procedure Laterality Date   . Cesarean section     . Wisdom tooth extraction          (Not in a hospital admission)  Current/Home Medications    BUPROPION (WELLBUTRIN) 100 MG TABLET    Take 1 tablet (100 mg total) by mouth 2 (two) times daily.    ESZOPICLONE (LUNESTA) 1 MG TABLET    Take 1 tablet (1 mg total) by mouth nightly. Take immediately before bedtime    MULTIPLE VITAMIN (MULTIVITAMIN) CAPSULE    Take 1 capsule by mouth daily.    OFLOXACIN (OCUFLOX) 0.3 % OPHTHALMIC SOLUTION    Place 1 drop into both eyes 4 (four) times daily.    TRANEXAMIC ACID 650 MG TAB    Take 2 tablets by mouth 3 (three) times daily as needed. Heavy menses     Allergies   Allergen Reactions   . Keflex [Cephalexin] Hives   . Nitrofurantoin      AKA Macrobid   . Percocet [Oxycodone-Acetaminophen] Hives   . Sulfa Antibiotics Hives      Social  History   Substance Use Topics   . Smoking status: Never Smoker    . Smokeless tobacco: Never Used   . Alcohol Use: No      Family History   Problem Relation Age of Onset   . Sleep disorder Mother    . Anxiety disorder Mother    . Hepatitis Mother    . Cancer Mother    . Breast cancer Mother      dx in her 32   . Alzheimer's disease Father    . Thyroid disease Sister           Review of Systems - General ROS: negative for - fatigue, malaise, sleep disturbance or weight loss  Psychological ROS: chronic ADHD , stable on meds   Ophthalmic ROS: negative for - blurry vision or double vision  Respiratory ROS: no cough, shortness of breath, or wheezing  Cardiovascular ROS: no chest pain or dyspnea on exertion  negative for - irregular heartbeat  or palpitations  Gastrointestinal ROS: no abdominal pain, change in bowel habits, or black or bloody stools  negative for - diarrhea or nausea/vomiting  Musculoskeletal ROS: negative for - muscle pain or muscular weakness  Neurological ROS: negative for - behavioral changes, dizziness, headaches, numbness/tingling, seizures, speech problems, tremors, visual changes or weakness  +hair loss   +menometrorrhagia   Other ROS were reviewed and were neg       BP 103/64 mmHg  Pulse 83  Temp(Src) 98.7 F (37.1 C)  Resp 15  Wt 59.512 kg (131 lb 3.2 oz)  SpO2 98%  Wt Readings from Last 3 Encounters:   10/10/15 59.512 kg (131 lb 3.2 oz)   03/29/15 57.879 kg (127 lb 9.6 oz)   03/27/15 57.879 kg (127 lb 9.6 oz)           Physical Examination: General appearance - alert, well appearing, and in no distress, oriented to person, place, and time, normal appearing weight and anxious  Mental status - alert, oriented to person, place, and time, normal mood, behavior, speech, dress, motor activity, and thought processes, affect appropriate to mood,  Not anxious or agitated  Eyes - pupils equal and reactive, extraocular eye movements intact  Chest - clear to auscultation, no wheezes, rales or  rhonchi, symmetric air entry  Heart - normal rate, regular rhythm, normal S1, S2, no murmurs, rubs, clicks or gallops, no JVD  Abdomen - soft, nontender, nondistended, no masses or organomegaly  Neurological - alert, oriented, normal speech, no focal findings or movement disorder noted, DTR's normal and symmetric, motor and sensory grossly normal bilaterally, no tremors  Musculoskeletal - no joint tenderness, deformity or swelling, no muscular tenderness noted  Extremities - peripheral pulses normal, no pedal edema, no clubbing or cyanosis          A/P:    1. Alopecia  CBC and differential    Comprehensive metabolic panel    TSH    T3, free    T4, free    Vitamin D,25 OH, Total    Vitamin B12    Estradiol    Progesterone    Prolactin    Testosterone, free, total    Follicle stimulating hormone    Luteinizing hormone    Ferritin   2. Cyst of left ovary  Estradiol    Progesterone    Prolactin    Testosterone, free, total    Follicle stimulating hormone    Luteinizing hormone    US Pelvis with Transvaginal   3. ADD (attention deficit disorder)         Check lab   meds refilled   Pelvic U.S and see gyn , Pt agreed   May need to see derm   Biotin :   Discussed common side effects as below  Cardiovascular: Increased systolic arterial pressure          Endocrine metabolic: Weight loss          Gastrointestinal: Abdominal pain  Loss of appetite , Xerostomia          Neurologic: Headache  Insomnia         Psychiatric: Feeling nervous     Discussed below  Serious side effects   Cardiovascular: Cardiomyopathy, Myocardial infarction, Peripheral vascular disease, Raynaud's disease, Sudden cardiac death   Dermatologic: Stevens-Johnson syndrome, Toxic epidermal necrolysis   Immunologic: Hypersensitivity reaction   Neurologic: Cerebrovascular accident, Seizure   Psychiatric: Psychotic disorder    Phylliss Bob MD

## 2015-10-22 ENCOUNTER — Telehealth (INDEPENDENT_AMBULATORY_CARE_PROVIDER_SITE_OTHER): Payer: Self-pay | Admitting: Student in an Organized Health Care Education/Training Program

## 2015-10-25 ENCOUNTER — Encounter (INDEPENDENT_AMBULATORY_CARE_PROVIDER_SITE_OTHER): Payer: Self-pay | Admitting: Student in an Organized Health Care Education/Training Program

## 2015-10-26 ENCOUNTER — Encounter (INDEPENDENT_AMBULATORY_CARE_PROVIDER_SITE_OTHER): Payer: Self-pay

## 2015-10-26 ENCOUNTER — Telehealth (INDEPENDENT_AMBULATORY_CARE_PROVIDER_SITE_OTHER): Payer: Self-pay | Admitting: Student in an Organized Health Care Education/Training Program

## 2015-10-26 NOTE — Telephone Encounter (Signed)
Pt called very upset about some billing questions, please call her at [phone]

## 2015-11-06 ENCOUNTER — Ambulatory Visit (INDEPENDENT_AMBULATORY_CARE_PROVIDER_SITE_OTHER): Payer: Self-pay | Admitting: Specialist

## 2015-11-12 ENCOUNTER — Telehealth (INDEPENDENT_AMBULATORY_CARE_PROVIDER_SITE_OTHER): Payer: Self-pay | Admitting: Student in an Organized Health Care Education/Training Program

## 2015-11-12 NOTE — Telephone Encounter (Addendum)
Pt called today 2nd time pt stated she been waiting on a phone call from you since last week.  Pt contact   [phone] (H)  [phone] (W)  [phone]

## 2015-11-30 ENCOUNTER — Ambulatory Visit (INDEPENDENT_AMBULATORY_CARE_PROVIDER_SITE_OTHER): Payer: 59 | Admitting: Internal Medicine

## 2015-11-30 ENCOUNTER — Encounter (INDEPENDENT_AMBULATORY_CARE_PROVIDER_SITE_OTHER): Payer: Self-pay | Admitting: Internal Medicine

## 2015-11-30 VITALS — BP 112/78 | HR 71 | Temp 97.4°F | Wt 130.2 lb

## 2015-11-30 DIAGNOSIS — F988 Other specified behavioral and emotional disorders with onset usually occurring in childhood and adolescence: Secondary | ICD-10-CM

## 2015-11-30 DIAGNOSIS — F419 Anxiety disorder, unspecified: Secondary | ICD-10-CM

## 2015-11-30 MED ORDER — METHYLPHENIDATE HCL ER (OSM) 18 MG PO TBCR
18.0000 mg | EXTENDED_RELEASE_TABLET | Freq: Every day | ORAL | Status: DC
Start: 2015-11-30 — End: 2016-03-27

## 2015-11-30 MED ORDER — METHYLPHENIDATE HCL ER (OSM) 18 MG PO TBCR
18.0000 mg | EXTENDED_RELEASE_TABLET | Freq: Every day | ORAL | Status: DC
Start: 2015-11-30 — End: 2016-02-25

## 2015-11-30 MED ORDER — METHYLPHENIDATE HCL ER (OSM) 18 MG PO TBCR
18.0000 mg | EXTENDED_RELEASE_TABLET | Freq: Every morning | ORAL | Status: DC
Start: 2015-11-30 — End: 2016-02-25

## 2015-11-30 MED ORDER — BUPROPION HCL 100 MG PO TABS
100.0000 mg | ORAL_TABLET | Freq: Two times a day (BID) | ORAL | Status: DC
Start: 2015-11-30 — End: 2016-03-27

## 2015-11-30 NOTE — Progress Notes (Signed)
Subjective:       Patient ID: Deborah Kirk is a 40 y.o. female with Anxiety and ADD requests refill of medication.    HPI  Anxiety - she takes Wellbutrin 100 mg BID with improvement. Since her son developmental problems have improved she feel much better.  ADD - she is on Concerta ER 18 mg daily. No palpitations, weight loss. She uses Lunesta infrequently for insomnia    The following portions of the patient's history were reviewed and updated as appropriate: allergies, current medications and problem list.    Review of Systems        Objective:    Physical Exam  BP 112/78 mmHg  Pulse 71  Temp(Src) 97.4 F (36.3 C) (Oral)  Wt 59.058 kg (130 lb 3.2 oz)  Constitutional:  Alert, oriented X 3. NAD  Head    Atraumatic, normocephalic  Eye:   Conjunctival - pink  Chest:   Unlabored respirations. Lungs clear to auscultation, good breath sounds  Cardiac:  Normal S1&S2 without murmurs. No edema.  Skin:   Warm, dry, no rashes in visible areas  Psych:   Normal affect, thought content        Assessment:       1. ADD (attention deficit disorder)     2. Anxiety              Plan:      Procedures    Renew medications.

## 2015-11-30 NOTE — Progress Notes (Signed)
Have you seen any new specialists/physicians since you were last here?no      Limb alert protocol reviewed?  Yes    Limb questionnaire reviewed.   Patient denies any limb restrictions.

## 2015-12-05 ENCOUNTER — Telehealth (INDEPENDENT_AMBULATORY_CARE_PROVIDER_SITE_OTHER): Payer: Self-pay

## 2015-12-05 NOTE — Telephone Encounter (Signed)
Called pt to confirm prior authorization for concerta. Pt advised she will call first then process concerta, pt stated she hasn't turned in her refill for concerta yet.    Advised pt to call at least 4 days prior to running out of concerta. Pt verbalized understanding.

## 2015-12-12 NOTE — Telephone Encounter (Signed)
Pt called requesting Eileen Stanford to start doing the Prior Authorization for Concerta.

## 2015-12-13 ENCOUNTER — Telehealth (INDEPENDENT_AMBULATORY_CARE_PROVIDER_SITE_OTHER): Payer: Self-pay | Admitting: Internal Medicine

## 2015-12-13 NOTE — Telephone Encounter (Signed)
Spoke to patient who advised rx ([facility]) phone number [phone] concerta prior authorization need to be started over the phone.

## 2015-12-13 NOTE — Telephone Encounter (Signed)
Pt called stating that the insurance company will fax over some form to fill up for Concerta requesting prior authorization.

## 2015-12-14 ENCOUNTER — Encounter (INDEPENDENT_AMBULATORY_CARE_PROVIDER_SITE_OTHER): Payer: Self-pay

## 2015-12-14 ENCOUNTER — Telehealth (INDEPENDENT_AMBULATORY_CARE_PROVIDER_SITE_OTHER): Payer: Self-pay

## 2015-12-14 NOTE — Telephone Encounter (Signed)
Called pt to advise prior authorization for concerta (methylphenid) approved. Pt verbalized understanding.    Approval letter scanned in Epic.

## 2015-12-26 ENCOUNTER — Emergency Department (HOSPITAL_COMMUNITY)
Admission: EM | Admit: 2015-12-26 | Discharge: 2015-12-26 | Disposition: A | Discharge status: Home / Self Care | Payer: Self-pay | Attending: Emergency Medicine | Admitting: Emergency Medicine

## 2015-12-26 ENCOUNTER — Encounter (HOSPITAL_COMMUNITY): Payer: Self-pay | Admitting: Nurse Practitioner

## 2015-12-26 ENCOUNTER — Emergency Department (HOSPITAL_COMMUNITY): Payer: Self-pay

## 2015-12-26 DIAGNOSIS — W231XXA Caught, crushed, jammed, or pinched between stationary objects, initial encounter: Secondary | ICD-10-CM | POA: Insufficient documentation

## 2015-12-26 DIAGNOSIS — Y990 Civilian activity done for income or pay: Secondary | ICD-10-CM | POA: Insufficient documentation

## 2015-12-26 DIAGNOSIS — Y92512 Supermarket, store or market as the place of occurrence of the external cause: Secondary | ICD-10-CM | POA: Insufficient documentation

## 2015-12-26 DIAGNOSIS — S60222A Contusion of left hand, initial encounter: Secondary | ICD-10-CM | POA: Insufficient documentation

## 2015-12-26 DIAGNOSIS — Z87891 Personal history of nicotine dependence: Secondary | ICD-10-CM | POA: Insufficient documentation

## 2015-12-26 DIAGNOSIS — Y939 Activity, unspecified: Secondary | ICD-10-CM | POA: Insufficient documentation

## 2015-12-26 NOTE — ED Triage Notes (Signed)
She c/o 2 day history increasing pain, swelling, bruising to L hand after a crush injury between a metal shopping cart and metal rack. She has an abrasion to dorsum of hand with dried scab. She has been applying ice and taking tylenol at home for relief. Pulses intact, skin w/d, can wiggle all digits. C/o numbness in hand today.

## 2015-12-26 NOTE — ED Provider Notes (Signed)
[facility]-EMERGENCY DEPT Provider Note   CSN: [id number] Arrival date & time: 12/26/15  1503  First Provider Contact: 4:35pm.  By signing my name below, I, Crystal Hernandez, attest that this documentation has been prepared under the direction and in the presence of [employer].  Electronically Signed: Vista Hernandez, ED Scribe. 12/26/15. 4:44 PM.   History   Chief Complaint Chief Complaint  Patient presents with  . Hand Injury    HPI HPI Comments: Crystal Hernandez is a 40 y.o. Right handed female who presents to the Emergency Department complaining of an injury to her left hand that occurred 2 days ago. Pt works at [employer] and was moving her cart in the middle of an aisle when she jammed her left hand between another shoppers cart. Pt states the area on her left hand has progressively become more swollen and numb. Pt reports no pain currently which she believes is due to the numbness. Pt states she has "very little" feeling in her fingers. Pt has taken Tylenol and applied ice to the area with no relief. Pt denies any pain in her elbow.   The history is provided by the patient. No language interpreter was used.  Wrist/Forearm Injury     Past Medical History:  Diagnosis Date  . Abnormal Pap smear   . Depression   . 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 Infection    UTI  . Kidney abscess   . Seasonal allergies     There are no active problems to display for this patient.   Past Surgical History:  Procedure Laterality Date  . ANTERIOR CRUCIATE LIGAMENT REPAIR     rt  . COLPOSCOPY    . KNEE SURGERY    . TUBAL LIGATION    . urethral stint    . WISDOM TOOTH EXTRACTION      OB History    Gravida Para Term Preterm AB Living   SAB TAB Ectopic Multiple Live Births                   Home Medications    Prior to Admission medications   Medication Sig Start Date End Date Taking? Authorizing Provider  metroNIDAZOLE (FLAGYL) 500  MG tablet Take 1 tablet (500 mg total) by mouth 2 (two) times daily. 08/03/15   Judeth Horn, NP    Family History Family History  Problem Relation Age of Onset  . Cancer Father     76  . Anesthesia problems Neg Hx   . Hypotension Neg Hx   . Malignant hyperthermia Neg Hx   . Pseudochol deficiency Neg Hx     Social History Social History  Substance Use Topics  . Smoking status: Former Smoker    Packs/day: 0.50    Years: 26.00    Types: Cigarettes  . Smokeless tobacco: Never Used  . Alcohol use No     Comment: occasional      Allergies   Morphine and related and Lactose intolerance (gi)   Review of Systems Review of Systems  Musculoskeletal: Positive for arthralgias (left hand).  Neurological: Positive for numbness (left hand).  All other systems reviewed and are negative.    Physical Exam Updated Vital Signs BP (!) 149/103 (BP Location: Left Arm)   Pulse 95   Temp 98.7 F (37.1 C) (Oral)   Resp 17   Ht  (1.626 m)  Wt 196 lb 3.2 oz (89 kg)   LMP 12/21/2015   SpO2 100%   BMI 33.68 kg/m   Physical Exam  Constitutional: She is oriented to person, place, and time. She appears well-developed and well-nourished. No distress.  HENT:  Head: Normocephalic and atraumatic.  Neck: Normal range of motion.  Pulmonary/Chest: Effort normal.  Musculoskeletal:  Left elbow: No obvious swelling or deformity. No tenderness to palpation. FROM.  Left forearm: No obvious swelling or deformity. No tenderness to palpation. Left wrist: No obvious swelling or deformity. No tenderness to palpation. FROM.  Left hand: There is mild swelling and bruising over dorsal aspect of hand. No deformity. Mild tenderness to palpation. FROM of fingers. N/V intact.     Neurological: She is alert and oriented to person, place, and time.  Skin: Skin is warm and dry. She is not diaphoretic.  Psychiatric: She has a normal mood and affect. Judgment normal.  Nursing note and vitals  reviewed.    ED Treatments / Results  Labs (all labs ordered are listed, but only abnormal results are displayed) Labs Reviewed - No data to display  EKG  EKG Interpretation None       Radiology Dg Hand Complete Left  Result Date: 12/26/2015 CLINICAL DATA:  Left hand pain after injury 2 days ago at work. EXAM: LEFT HAND - COMPLETE 3+ VIEW COMPARISON:  None. FINDINGS: There is no evidence of fracture or dislocation. There is no evidence of arthropathy or other focal bone abnormality. Soft tissues are unremarkable. IMPRESSION: Normal left hand. Electronically Signed   By: Lupita Raider, M.D.   On: 12/26/2015 16:21   Procedures Procedures  DIAGNOSTIC STUDIES: Oxygen Saturation is 100% on RA, normal by my interpretation.  COORDINATION OF CARE: 4:33 PM-Will order medication and instructions for care of injury. Discussed treatment plan with pt at bedside and pt agreed to plan.    Medications Ordered in ED Medications - No data to display   Initial Impression / Assessment and Plan / ED Course  I have reviewed the triage vital signs and the nursing notes.  Pertinent labs & imaging results that were available during my care of the patient were reviewed by me and considered in my medical decision making (see chart for details).  Clinical Course   40 year old female who presents with MSK after an injury. Xray negative for acute bony pathology. Mild swelling and bruising noted on exam. Thumb spica applied for comfort. Patient is NAD, non-toxic, with stable VS. Patient is informed of clinical course, understands medical decision making process, and agrees with plan. Opportunity for questions provided and all questions answered. Return precautions given.  I personally performed the services described in this documentation, which was scribed in my presence. The recorded information has been reviewed and is accurate.  Final Clinical Impressions(s) / ED Diagnoses   Final diagnoses:   Hand contusion, left, initial encounter    New Prescriptions Discharge Medication List as of 12/26/2015  5:10 PM        Crystal Born, PA-C 12/26/15 2052    Crystal Rhine, MD 12/27/15 1419

## 2015-12-26 NOTE — ED Notes (Signed)
Pt is in stable condition upon d/c and ambulates from ED. 

## 2015-12-28 ENCOUNTER — Ambulatory Visit (INDEPENDENT_AMBULATORY_CARE_PROVIDER_SITE_OTHER): Payer: Self-pay | Admitting: Internal Medicine

## 2016-02-21 ENCOUNTER — Ambulatory Visit (INDEPENDENT_AMBULATORY_CARE_PROVIDER_SITE_OTHER): Payer: 59 | Admitting: Internal Medicine

## 2016-02-25 ENCOUNTER — Ambulatory Visit (INDEPENDENT_AMBULATORY_CARE_PROVIDER_SITE_OTHER): Payer: 59 | Admitting: Specialist

## 2016-02-25 ENCOUNTER — Encounter (INDEPENDENT_AMBULATORY_CARE_PROVIDER_SITE_OTHER): Payer: Self-pay | Admitting: Specialist

## 2016-02-25 VITALS — BP 102/69 | HR 81 | Ht 64.5 in | Wt 127.8 lb

## 2016-02-25 DIAGNOSIS — N926 Irregular menstruation, unspecified: Secondary | ICD-10-CM

## 2016-02-25 DIAGNOSIS — Z01411 Encounter for gynecological examination (general) (routine) with abnormal findings: Secondary | ICD-10-CM

## 2016-02-25 NOTE — Progress Notes (Signed)
Subjective:       Patient ID: Deborah Kirk is a 40 y.o. female.    HPI She is an established patient.  She presents for an annual exam.  She is generally having regular menses.      The following portions of the patient's history were reviewed and updated as appropriate: allergies, current medications, past family history, past medical history, past social history, past surgical history and problem list.    Review of Systems   Constitutional: Negative.    Respiratory: Negative.    Cardiovascular: Negative.    Gastrointestinal: Negative.    Genitourinary: Negative.    Musculoskeletal: Negative.    Skin: Negative.    Neurological: Negative.    Hematological: Negative.    Psychiatric/Behavioral: Negative.            Objective:    Physical Exam   Constitutional: She is oriented to person, place, and time. She appears well-developed.   Cardiovascular: Normal rate.    Pulmonary/Chest: Effort normal and breath sounds normal.   Abdominal: Soft.   Genitourinary: Vagina normal and uterus normal. No breast tenderness, discharge or bleeding. There is no rash, tenderness, lesion or injury on the right labia. There is no rash, tenderness, lesion or injury on the left labia. Cervix exhibits no motion tenderness, no discharge and no friability. Right adnexum displays no mass, no tenderness and no fullness. Left adnexum displays no mass, no tenderness and no fullness.   Musculoskeletal: Normal range of motion.   Neurological: She is alert and oriented to person, place, and time.   Skin: Skin is warm and dry.           Assessment:       Normal well woman exam      Plan:      Procedures  Pap  mammo  Menopausal transition symptoms discussed and all questions answered.

## 2016-03-01 LAB — PAP IG WITH HPV AND REFLEXX TO 16 18 GENOTYPING
.: 0
HPV Genotype, 16: NEGATIVE
HPV Genotype, 18: NEGATIVE
HPV, high-risk: POSITIVE — AB

## 2016-03-14 ENCOUNTER — Encounter (HOSPITAL_COMMUNITY): Payer: Self-pay | Admitting: *Deleted

## 2016-03-14 ENCOUNTER — Inpatient Hospital Stay (HOSPITAL_COMMUNITY)
Admission: AD | Admit: 2016-03-14 | Discharge: 2016-03-14 | Disposition: A | Discharge status: Home / Self Care | Payer: Self-pay | Source: Ambulatory Visit | Attending: Obstetrics & Gynecology | Admitting: Obstetrics & Gynecology

## 2016-03-14 DIAGNOSIS — R399 Unspecified symptoms and signs involving the genitourinary system: Secondary | ICD-10-CM

## 2016-03-14 DIAGNOSIS — M545 Low back pain: Secondary | ICD-10-CM | POA: Insufficient documentation

## 2016-03-14 DIAGNOSIS — Z885 Allergy status to narcotic agent status: Secondary | ICD-10-CM | POA: Insufficient documentation

## 2016-03-14 DIAGNOSIS — Z87891 Personal history of nicotine dependence: Secondary | ICD-10-CM | POA: Insufficient documentation

## 2016-03-14 DIAGNOSIS — R300 Dysuria: Secondary | ICD-10-CM | POA: Insufficient documentation

## 2016-03-14 DIAGNOSIS — Z87440 Personal history of urinary (tract) infections: Secondary | ICD-10-CM | POA: Insufficient documentation

## 2016-03-14 DIAGNOSIS — N760 Acute vaginitis: Secondary | ICD-10-CM | POA: Insufficient documentation

## 2016-03-14 DIAGNOSIS — B9689 Other specified bacterial agents as the cause of diseases classified elsewhere: Secondary | ICD-10-CM | POA: Insufficient documentation

## 2016-03-14 HISTORY — DX: Unspecified abnormal cytological findings in specimens from vagina: R87.629

## 2016-03-14 LAB — WET PREP, GENITAL
Sperm: NONE SEEN
TRICH WET PREP: NONE SEEN
Yeast Wet Prep HPF POC: NONE SEEN

## 2016-03-14 LAB — URINALYSIS, ROUTINE W REFLEX MICROSCOPIC
BILIRUBIN URINE: NEGATIVE
Glucose, UA: NEGATIVE mg/dL
KETONES UR: NEGATIVE mg/dL
Leukocytes, UA: NEGATIVE
NITRITE: NEGATIVE
PH: 6 (ref 5.0–8.0)
Protein, ur: NEGATIVE mg/dL
Specific Gravity, Urine: >1.03 — ABNORMAL HIGH (ref 1.005–1.030)

## 2016-03-14 LAB — URINE MICROSCOPIC-ADD ON: WBC, UA: NONE SEEN WBC/hpf (ref 0–5)

## 2016-03-14 LAB — POCT PREGNANCY, URINE: PREG TEST UR: NEGATIVE

## 2016-03-14 MED ORDER — METRONIDAZOLE 500 MG PO TABS: 500.0000 mg | ORAL_TABLET | Freq: Two times a day (BID) | ORAL | 2 refills | Status: DC

## 2016-03-14 MED ORDER — CIPROFLOXACIN HCL 500 MG PO TABS: 500.0000 mg | ORAL_TABLET | Freq: Two times a day (BID) | ORAL | 2 refills | Status: DC

## 2016-03-14 MED ORDER — FLUCONAZOLE 150 MG PO TABS: 150.0000 mg | ORAL_TABLET | Freq: Once | ORAL | 3 refills | Status: AC

## 2016-03-14 NOTE — MAU Note (Signed)
Pt C/O lower back pain for the last 1 1/2 months, also lower abd pain for 1 1/2 weeks.  Also vaginal discharge - white, milky with blood, odorous.  Has noted blood in urine.  Slight pain with urination.

## 2016-03-14 NOTE — Discharge Instructions (Signed)
Bacterial Vaginosis °Bacterial vaginosis is a vaginal infection that occurs when the normal balance of bacteria in the vagina is disrupted. It results from an overgrowth of certain bacteria. This is the most common vaginal infection in women of childbearing age. Treatment is important to prevent complications, especially in pregnant women, as it can cause a premature delivery. °CAUSES  °Bacterial vaginosis is caused by an increase in harmful bacteria that are normally present in smaller amounts in the vagina. Several different kinds of bacteria can cause bacterial vaginosis. However, the reason that the condition develops is not fully understood. °RISK FACTORS °Certain activities or behaviors can put you at an increased risk of developing bacterial vaginosis, including: °· Having a new sex partner or multiple sex partners. °· Douching. °· Using an intrauterine device (IUD) for contraception. °Women do not get bacterial vaginosis from toilet seats, bedding, swimming pools, or contact with objects around them. °SIGNS AND SYMPTOMS  °Some women with bacterial vaginosis have no signs or symptoms. Common symptoms include: °· Grey vaginal discharge. °· A fishlike odor with discharge, especially after sexual intercourse. °· Itching or burning of the vagina and vulva. °· Burning or pain with urination. °DIAGNOSIS  °Your health care provider will take a medical history and examine the vagina for signs of bacterial vaginosis. A sample of vaginal fluid may be taken. Your health care provider will look at this sample under a microscope to check for bacteria and abnormal cells. A vaginal pH test may also be done.  °TREATMENT  °Bacterial vaginosis may be treated with antibiotic medicines. These may be given in the form of a pill or a vaginal cream. A second round of antibiotics may be prescribed if the condition comes back after treatment. Because bacterial vaginosis increases your risk for sexually transmitted diseases, getting  treated can help reduce your risk for chlamydia, gonorrhea, HIV, and herpes. °HOME CARE INSTRUCTIONS  °· Only take over-the-counter or prescription medicines as directed by your health care provider. °· If antibiotic medicine was prescribed, take it as directed. Make sure you finish it even if you start to feel better. °· Tell all sexual partners that you have a vaginal infection. They should see their health care provider and be treated if they have problems, such as a mild rash or itching. °· During treatment, it is important that you follow these instructions: °¨ Avoid sexual activity or use condoms correctly. °¨ Do not douche. °¨ Avoid alcohol as directed by your health care provider. °¨ Avoid breastfeeding as directed by your health care provider. °SEEK MEDICAL CARE IF:  °· Your symptoms are not improving after 3 days of treatment. °· You have increased discharge or pain. °· You have a fever. °MAKE SURE YOU:  °· Understand these instructions. °· Will watch your condition. °· Will get help right away if you are not doing well or get worse. °FOR MORE INFORMATION  °Centers for Disease Control and Prevention, Division of STD Prevention: www.cdc.gov/std °American Sexual Health Association (ASHA): www.ashastd.org  °  °This information is not intended to replace advice given to you by your health care provider. Make sure you discuss any questions you have with your health care provider. °  °Document Released: 05/19/2005 Document Revised: 06/09/2014 Document Reviewed: 12/29/2012 °Elsevier Interactive Patient Education ©2016 Elsevier Inc. ° ° ° °Vaginitis °Vaginitis is an inflammation of the vagina. It is most often caused by a change in the normal balance of the bacteria and yeast that live in the vagina. This change in balance   causes an overgrowth of certain bacteria or yeast, which causes the inflammation. There are different types of vaginitis, but the most common types are:  Bacterial vaginosis.  Yeast  infection (candidiasis).  Trichomoniasis vaginitis. This is a sexually transmitted infection (STI).  Viral vaginitis.  Atrophic vaginitis.  Allergic vaginitis. CAUSES  The cause depends on the type of vaginitis. Vaginitis can be caused by:  Bacteria (bacterial vaginosis).  Yeast (yeast infection).  A parasite (trichomoniasis vaginitis)  A virus (viral vaginitis).  Low hormone levels (atrophic vaginitis). Low hormone levels can occur during pregnancy, breastfeeding, or after menopause.  Irritants, such as bubble baths, scented tampons, and feminine sprays (allergic vaginitis). Other factors can change the normal balance of the yeast and bacteria that live in the vagina. These include:  Antibiotic medicines.  Poor hygiene.  Diaphragms, vaginal sponges, spermicides, birth control pills, and intrauterine devices (IUD).  Sexual intercourse.  Infection.  Uncontrolled diabetes.  A weakened immune system. SYMPTOMS  Symptoms can vary depending on the cause of the vaginitis. Common symptoms include:  Abnormal vaginal discharge.  The discharge is white, gray, or yellow with bacterial vaginosis.  The discharge is thick, white, and cheesy with a yeast infection.  The discharge is frothy and yellow or greenish with trichomoniasis.  A bad vaginal odor.  The odor is fishy with bacterial vaginosis.  Vaginal itching, pain, or swelling.  Painful intercourse.  Pain or burning when urinating. Sometimes, there are no symptoms. TREATMENT  Treatment will vary depending on the type of infection.   Bacterial vaginosis and trichomoniasis are often treated with antibiotic creams or pills.  Yeast infections are often treated with antifungal medicines, such as vaginal creams or suppositories.  Viral vaginitis has no cure, but symptoms can be treated with medicines that relieve discomfort. Your sexual partner should be treated as well.  Atrophic vaginitis may be treated with an  estrogen cream, pill, suppository, or vaginal ring. If vaginal dryness occurs, lubricants and moisturizing creams may help. You may be told to avoid scented soaps, sprays, or douches.  Allergic vaginitis treatment involves quitting the use of the product that is causing the problem. Vaginal creams can be used to treat the symptoms. HOME CARE INSTRUCTIONS   Take all medicines as directed by your caregiver.  Keep your genital area clean and dry. Avoid soap and only rinse the area with water.  Avoid douching. It can remove the healthy bacteria in the vagina.  Do not use tampons or have sexual intercourse until your vaginitis has been treated. Use sanitary pads while you have vaginitis.  Wipe from front to back. This avoids the spread of bacteria from the rectum to the vagina.  Let air reach your genital area.  Wear cotton underwear to decrease moisture buildup.  Avoid wearing underwear while you sleep until your vaginitis is gone.  Avoid tight pants and underwear or nylons without a cotton panel.  Take off wet clothing (especially bathing suits) as soon as possible.  Use mild, non-scented products. Avoid using irritants, such as:  Scented feminine sprays.  Fabric softeners.  Scented detergents.  Scented tampons.  Scented soaps or bubble baths.  Practice safe sex and use condoms. Condoms may prevent the spread of trichomoniasis and viral vaginitis. SEEK MEDICAL CARE IF:   You have abdominal pain.  You have a fever or persistent symptoms for more than 2-3 days.  You have a fever and your symptoms suddenly get worse.   This information is not intended to replace advice given  to you by your health care provider. Make sure you discuss any questions you have with your health care provider.   Document Released: 03/16/2007 Document Revised: 10/03/2014 Document Reviewed: 10/30/2011 Elsevier Interactive Patient Education 2016 Elsevier Inc.    Urinary Tract Infection Urinary  tract infections (UTIs) can develop anywhere along your urinary tract. Your urinary tract is your body's drainage system for removing wastes and extra water. Your urinary tract includes two kidneys, two ureters, a bladder, and a urethra. Your kidneys are a pair of bean-shaped organs. Each kidney is about the size of your fist. They are located below your ribs, one on each side of your spine. CAUSES Infections are caused by microbes, which are microscopic organisms, including fungi, viruses, and bacteria. These organisms are so small that they can only be seen through a microscope. Bacteria are the microbes that most commonly cause UTIs. SYMPTOMS  Symptoms of UTIs may vary by age and gender of the patient and by the location of the infection. Symptoms in young women typically include a frequent and intense urge to urinate and a painful, burning feeling in the bladder or urethra during urination. Older women and men are more likely to be tired, shaky, and weak and have muscle aches and abdominal pain. A fever may mean the infection is in your kidneys. Other symptoms of a kidney infection include pain in your back or sides below the ribs, nausea, and vomiting. DIAGNOSIS To diagnose a UTI, your caregiver will ask you about your symptoms. Your caregiver will also ask you to provide a urine sample. The urine sample will be tested for bacteria and white blood cells. White blood cells are made by your body to help fight infection. TREATMENT  Typically, UTIs can be treated with medication. Because most UTIs are caused by a bacterial infection, they usually can be treated with the use of antibiotics. The choice of antibiotic and length of treatment depend on your symptoms and the type of bacteria causing your infection. HOME CARE INSTRUCTIONS  If you were prescribed antibiotics, take them exactly as your caregiver instructs you. Finish the medication even if you feel better after you have only taken some of the  medication.  Drink enough water and fluids to keep your urine clear or pale yellow.  Avoid caffeine, tea, and carbonated beverages. They tend to irritate your bladder.  Empty your bladder often. Avoid holding urine for long periods of time.  Empty your bladder before and after sexual intercourse.  After a bowel movement, women should cleanse from front to back. Use each tissue only once. SEEK MEDICAL CARE IF:   You have back pain.  You develop a fever.  Your symptoms do not begin to resolve within 3 days. SEEK IMMEDIATE MEDICAL CARE IF:   You have severe back pain or lower abdominal pain.  You develop chills.  You have nausea or vomiting.  You have continued burning or discomfort with urination. MAKE SURE YOU:   Understand these instructions.  Will watch your condition.  Will get help right away if you are not doing well or get worse.   This information is not intended to replace advice given to you by your health care provider. Make sure you discuss any questions you have with your health care provider.   Document Released: 02/26/2005 Document Revised: 02/07/2015 Document Reviewed: 06/27/2011 Elsevier Interactive Patient Education [employer]2016 [employer]

## 2016-03-14 NOTE — MAU Provider Note (Signed)
History     CSN: [id number]  Arrival date and time: 03/14/16 1328   First Provider Initiated Contact with Patient 03/14/16 1406      Chief Complaint  Patient presents with  . Back Pain  . Abdominal Pain  . Vaginal Discharge   Crystal Hernandez is a 40 y.o. [phone] who presents to MAU today for evaluation of back pain, abdominal pain and vaginal discharge. According to RN note: "Patient c/o lower back pain for the last 1 1/2 months, also lower abdominal pain for 1 1/2 weeks.  Also has vaginal discharge - white, milky with blood, odorous.  Has noted blood in urine.  Slight pain with urination."  She feels she has a UTI involving her kidneys and BV.  Denies any fevers, chills, sweats, dysuria, nausea, vomiting, other GI or GU symptoms or other general symptoms.    Obstetric History   G3   P3   T3   P0   A0   L3    SAB0   TAB0   Ectopic0   Multiple0   Live Births0     # Outcome Date GA Lbr Len/2nd Weight Sex Delivery Anes PTL Lv  3 Term      Vag-Spont     2 Term      Vag-Spont     1 Term      Vag-Spont         Past Medical History:  Diagnosis Date  . Abnormal Pap smear   . Depression   . 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 Infection    UTI  . Kidney abscess   . Seasonal allergies   . Vaginal Pap smear, abnormal     Past Surgical History:  Procedure Laterality Date  . ANTERIOR CRUCIATE LIGAMENT REPAIR     rt  . COLPOSCOPY    . KNEE SURGERY    . TUBAL LIGATION    . urethral stint    . WISDOM TOOTH EXTRACTION      Family History  Problem Relation Age of Onset  . Cancer Father     21  . Anesthesia problems Neg Hx   . Hypotension Neg Hx   . Malignant hyperthermia Neg Hx   . Pseudochol deficiency Neg Hx     Social History  Substance Use Topics  . Smoking status: Former Smoker    Packs/day: 0.50    Years: 26.00    Types: Cigarettes  . Smokeless tobacco: Never Used  . Alcohol use No     Comment: occasional      Allergies:  Allergies  Allergen Reactions  . Morphine And Related Itching    Had severe itching and was given benadryl.   . Lactose Intolerance (Gi)     Gi upset     Prescriptions Prior to Admission  Medication Sig Dispense Refill Last Dose  . acidophilus (RISAQUAD) CAPS capsule Take 1 capsule by mouth daily.   03/13/2016 at Unknown time  . [DISCONTINUED] metroNIDAZOLE (FLAGYL) 500 MG tablet Take 1 tablet (500 mg total) by mouth 2 (two) times daily. (Patient not taking: Reported on 03/14/2016) 14 tablet 0 Completed Course at Unknown time    Review of Systems  All other systems reviewed and are negative.  Physical Exam   Blood pressure 124/83, pulse 75, temperature 98.1 F (36.7 C), temperature source Oral, resp. rate 18, last menstrual period 03/08/2016.  Physical Exam  Constitutional: She is oriented to person, place,  and time. She appears well-developed and well-nourished.  HENT:  Head: Normocephalic and atraumatic.  Eyes: Conjunctivae and EOM are normal. Pupils are equal, round, and reactive to light.  Neck: Neck supple.  Cardiovascular: Normal rate.   Respiratory: Effort normal and breath sounds normal.  GI: Soft. Bowel sounds are normal. She exhibits no mass. There is tenderness. There is no rebound and no guarding.  Mild suprapubic and LLQ tenderness to palpation  Genitourinary: Uterus normal. Vaginal discharge found.  Genitourinary Comments: Thin, white discharge seen  Musculoskeletal: Normal range of motion.  Neurological: She is alert and oriented to person, place, and time.  Skin: Skin is warm and dry.  Psychiatric: She has a normal mood and affect.    MAU Course  Procedures MDM Cultures, UA and C&S done   Results for orders placed or performed during the hospital encounter of 03/14/16 (from the past 24 hour(s))  Urinalysis, Routine w reflex microscopic (not at [facility])     Status: Abnormal   Collection Time: 03/14/16  1:37 PM  Result Value Ref Range    Color, Urine YELLOW YELLOW   APPearance CLEAR CLEAR   Specific Gravity, Urine >1.030 (H) 1.005 - 1.030   pH 6.0 5.0 - 8.0   Glucose, UA NEGATIVE NEGATIVE mg/dL   Hgb urine dipstick TRACE (A) NEGATIVE   Bilirubin Urine NEGATIVE NEGATIVE   Ketones, ur NEGATIVE NEGATIVE mg/dL   Protein, ur NEGATIVE NEGATIVE mg/dL   Nitrite NEGATIVE NEGATIVE   Leukocytes, UA NEGATIVE NEGATIVE  Urine microscopic-add on     Status: Abnormal   Collection Time: 03/14/16  1:37 PM  Result Value Ref Range   Squamous Epithelial / LPF 0-5 (A) NONE SEEN   WBC, UA NONE SEEN 0 - 5 WBC/hpf   RBC / HPF 0-5 0 - 5 RBC/hpf   Bacteria, UA MANY (A) NONE SEEN  Wet prep, genital     Status: Abnormal   Collection Time: 03/14/16  2:10 PM  Result Value Ref Range   Yeast Wet Prep HPF POC NONE SEEN NONE SEEN   Trich, Wet Prep NONE SEEN NONE SEEN   Clue Cells Wet Prep HPF POC PRESENT (A) NONE SEEN   WBC, Wet Prep HPF POC FEW (A) NONE SEEN   Sperm NONE SEEN   Pregnancy, urine POC     Status: None   Collection Time: 03/14/16  2:40 PM  Result Value Ref Range   Preg Test, Ur NEGATIVE NEGATIVE    Assessment and Plan  BV (bacterial vaginosis): Metronidazole prescribed. Proper vulvar hygiene emphasized: discussed avoidance of perfumed soaps, detergents, lotions and any type of douches; in addition to wearing cotton underwear and no underwear at night.  Also recommended cleaning front to back, voiding and cleaning up after intercourse.  UTI symptoms: Culture sent, will follow up results and manage accordingly. Will presumptively treat due to symptoms.  Diflucan given for possible rebound yeast infection.  Advised to return for any worsening symptoms.   Estel Scholze A, MD 03/14/2016, 3:00 PM

## 2016-03-16 LAB — URINE CULTURE: Culture: >=100000 — AB

## 2016-03-17 LAB — GC/CHLAMYDIA PROBE AMP (~~LOC~~) NOT AT ARMC
Chlamydia: NEGATIVE
Neisseria Gonorrhea: NEGATIVE

## 2016-03-27 ENCOUNTER — Ambulatory Visit (INDEPENDENT_AMBULATORY_CARE_PROVIDER_SITE_OTHER): Payer: 59 | Admitting: Internal Medicine

## 2016-03-27 VITALS — BP 108/61 | HR 83 | Temp 97.6°F

## 2016-03-27 DIAGNOSIS — F419 Anxiety disorder, unspecified: Secondary | ICD-10-CM

## 2016-03-27 DIAGNOSIS — F900 Attention-deficit hyperactivity disorder, predominantly inattentive type: Secondary | ICD-10-CM

## 2016-03-27 DIAGNOSIS — Z23 Encounter for immunization: Secondary | ICD-10-CM

## 2016-03-27 MED ORDER — METHYLPHENIDATE HCL ER (OSM) 18 MG PO TBCR
18.0000 mg | EXTENDED_RELEASE_TABLET | Freq: Every day | ORAL | 0 refills | Status: DC
Start: 2016-03-27 — End: 2016-03-27

## 2016-03-27 MED ORDER — BUPROPION HCL 100 MG PO TABS
100.0000 mg | ORAL_TABLET | Freq: Two times a day (BID) | ORAL | 5 refills | Status: DC
Start: 2016-03-27 — End: 2016-11-03

## 2016-03-27 MED ORDER — METHYLPHENIDATE HCL ER (OSM) 18 MG PO TBCR
18.0000 mg | EXTENDED_RELEASE_TABLET | Freq: Every day | ORAL | 0 refills | Status: DC
Start: 2016-03-27 — End: 2016-07-08

## 2016-03-27 NOTE — Progress Notes (Signed)
Subjective:       Patient ID: Deborah Kirk is a 40 y.o. female. for follow up.    HPI  Anxiety - she takes Wellbutrin 100 mg BID with improvement. Since her son developmental problems have improved she feel much better. She is seeing counselor.  ADD - she is on Concerta ER 18 mg daily. No palpitations, weight loss. She uses Lunesta infrequently for insomnia    The following portions of the patient's history were reviewed and updated as appropriate: allergies, current medications and problem list.    Review of Systems  She needs documentation of immunization and tuberculosis testing for new job next month at [employer].      Objective:    Physical Exam  BP 108/61   Pulse 83   Temp 97.6 F (36.4 C) (Oral)   Constitutional:  Alert, oriented X 3. NAD  Head    Atraumatic, normocephalic  Eye:   Conjunctival - pink  Skin:   Warm, dry, no rashes in visible areas  Psych:   Normal affect, thought content        Assessment:       1. Attention deficit hyperactivity disorder (ADHD), predominantly inattentive type     2. Anxiety     3. Need for prophylactic vaccination and inoculation against influenza  Flu vaccine 3 years & up ([id number])           Plan:      Procedures   Continue current medications , annual influenza vaccine, seat belts, regular exercise,and healthy diet.

## 2016-04-08 ENCOUNTER — Encounter (INDEPENDENT_AMBULATORY_CARE_PROVIDER_SITE_OTHER): Payer: Self-pay

## 2016-05-07 ENCOUNTER — Other Ambulatory Visit: Payer: Self-pay | Admitting: Specialist

## 2016-06-24 ENCOUNTER — Telehealth (INDEPENDENT_AMBULATORY_CARE_PROVIDER_SITE_OTHER): Payer: Self-pay

## 2016-06-24 NOTE — Telephone Encounter (Signed)
Renewal/prior authorization for Concerta submitted through script Care.

## 2016-06-26 ENCOUNTER — Telehealth (INDEPENDENT_AMBULATORY_CARE_PROVIDER_SITE_OTHER): Payer: Self-pay

## 2016-06-26 ENCOUNTER — Encounter (INDEPENDENT_AMBULATORY_CARE_PROVIDER_SITE_OTHER): Payer: Self-pay

## 2016-06-26 ENCOUNTER — Encounter (INDEPENDENT_AMBULATORY_CARE_PROVIDER_SITE_OTHER): Payer: Self-pay | Admitting: Internal Medicine

## 2016-06-26 NOTE — Telephone Encounter (Signed)
Script care called to advise concerta prior authorization approved for 6 mos.

## 2016-07-08 ENCOUNTER — Other Ambulatory Visit (INDEPENDENT_AMBULATORY_CARE_PROVIDER_SITE_OTHER): Payer: Self-pay | Admitting: Internal Medicine

## 2016-07-08 MED ORDER — METHYLPHENIDATE HCL ER (OSM) 18 MG PO TBCR
18.0000 mg | EXTENDED_RELEASE_TABLET | Freq: Every day | ORAL | 0 refills | Status: DC
Start: 2016-07-08 — End: 2016-07-24

## 2016-07-09 ENCOUNTER — Telehealth (INDEPENDENT_AMBULATORY_CARE_PROVIDER_SITE_OTHER): Payer: Self-pay

## 2016-07-09 NOTE — Telephone Encounter (Signed)
Called pt to advise concerta script is ready for pick up at front desk. LM to call back with questions.

## 2016-07-22 ENCOUNTER — Telehealth (INDEPENDENT_AMBULATORY_CARE_PROVIDER_SITE_OTHER): Payer: Self-pay

## 2016-07-22 NOTE — Telephone Encounter (Signed)
Pt came to office to pick up refill script for concerta by Dr. Bjorn Pippin. Pt did not take script. Pt has appt with Dr. Jerolyn Center on 08/01/16 and has enough medication until appt. Pt requesting refill from Dr. Jerolyn Center and was wondering if she can receive refill without coming to 3/2 appt. Last o/v 03/27/16.    Please advise.

## 2016-07-22 NOTE — Telephone Encounter (Signed)
Pt came & picked up  Rx.

## 2016-07-24 ENCOUNTER — Other Ambulatory Visit (INDEPENDENT_AMBULATORY_CARE_PROVIDER_SITE_OTHER): Payer: Self-pay | Admitting: Internal Medicine

## 2016-07-24 ENCOUNTER — Telehealth (INDEPENDENT_AMBULATORY_CARE_PROVIDER_SITE_OTHER): Payer: Self-pay

## 2016-07-24 MED ORDER — METHYLPHENIDATE HCL ER (OSM) 18 MG PO TBCR
18.0000 mg | EXTENDED_RELEASE_TABLET | Freq: Every day | ORAL | 0 refills | Status: DC
Start: 2016-07-24 — End: 2016-11-03

## 2016-07-24 NOTE — Telephone Encounter (Signed)
Called pt to advise scripts (3)  for concerta ready for pick up at front desk. LM to call back with questions.

## 2016-08-01 ENCOUNTER — Ambulatory Visit (INDEPENDENT_AMBULATORY_CARE_PROVIDER_SITE_OTHER): Payer: 59 | Admitting: Internal Medicine

## 2016-08-12 ENCOUNTER — Encounter (HOSPITAL_COMMUNITY): Payer: Self-pay | Admitting: Emergency Medicine

## 2016-08-12 ENCOUNTER — Emergency Department (HOSPITAL_COMMUNITY)
Admission: EM | Admit: 2016-08-12 | Discharge: 2016-08-12 | Disposition: A | Discharge status: Home / Self Care | Payer: Self-pay | Attending: Emergency Medicine | Admitting: Emergency Medicine

## 2016-08-12 ENCOUNTER — Emergency Department (HOSPITAL_COMMUNITY): Payer: Self-pay

## 2016-08-12 DIAGNOSIS — X500XXA Overexertion from strenuous movement or load, initial encounter: Secondary | ICD-10-CM | POA: Insufficient documentation

## 2016-08-12 DIAGNOSIS — Y990 Civilian activity done for income or pay: Secondary | ICD-10-CM | POA: Insufficient documentation

## 2016-08-12 DIAGNOSIS — S63502A Unspecified sprain of left wrist, initial encounter: Secondary | ICD-10-CM | POA: Insufficient documentation

## 2016-08-12 DIAGNOSIS — Y9289 Other specified places as the place of occurrence of the external cause: Secondary | ICD-10-CM | POA: Insufficient documentation

## 2016-08-12 DIAGNOSIS — Z87891 Personal history of nicotine dependence: Secondary | ICD-10-CM | POA: Insufficient documentation

## 2016-08-12 DIAGNOSIS — Y939 Activity, unspecified: Secondary | ICD-10-CM | POA: Insufficient documentation

## 2016-08-12 NOTE — ED Notes (Signed)
C/o left wrist pain x 2 weeks. No known injury. Pain worse with movement.

## 2016-08-12 NOTE — ED Triage Notes (Signed)
Pt reports swelling and pain to left wrist since yesterday, denies injury. Radial pulse intact.

## 2016-08-12 NOTE — ED Provider Notes (Signed)
[facility]-EMERGENCY DEPT Provider Note   CSN: [id number] Arrival date & time: 08/12/16  1533  By signing my name below, I, Majel HomerPeyton Lee, attest that this documentation has been prepared under the direction and in the presence of Terance HartKelly Eamonn Sermeno, PA-C . Electronically Signed: Majel HomerPeyton Lee, Scribe. 08/12/2016. 5:08 PM.  History   Chief Complaint Chief Complaint  Patient presents with  . Wrist Pain  . Joint Swelling   The history is provided by the patient. No language interpreter was used.   HPI Comments: Crystal Hernandez is a 41 y.o. right-hand dominant female who presents to the Emergency Department complaining of gradual onset, worsening, left wrist and forearm pain that began ~3 weeks ago and worsened yesterday. Pt reports her pain is exacerbated with movement with associated "numbness" in her fingers and swelling to her left wrist that began last night. She states she works at [employer] in which she has to lift large, heavy boxes and notes she tried going to work this morning but her pain became "unbearable." She reports she took Ibuprofen last night with no relief and applied heat and a wrist brace today with mild relief. Pt denies any injury or trauma to her left wrist and weakness in her extremities.    Past Medical History:  Diagnosis Date  . Abnormal Pap smear   . Depression   . 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 Infection    UTI  . Kidney abscess   . Seasonal allergies   . Vaginal Pap smear, abnormal    There are no active problems to display for this patient.  Past Surgical History:  Procedure Laterality Date  . ANTERIOR CRUCIATE LIGAMENT REPAIR     rt  . COLPOSCOPY    . KNEE SURGERY    . TUBAL LIGATION    . urethral stint    . WISDOM TOOTH EXTRACTION      OB History    Gravida Para Term Preterm AB Living   3 3 3     3    SAB TAB Ectopic Multiple Live Births                 Home Medications    Prior to Admission medications   Medication  Sig Start Date End Date Taking? Authorizing Provider  acidophilus (RISAQUAD) CAPS capsule Take 1 capsule by mouth daily.    Historical Provider, MD  ciprofloxacin (CIPRO) 500 MG tablet Take 1 tablet (500 mg total) by mouth 2 (two) times daily. 03/14/16   Tereso NewcomerUgonna A Anyanwu, MD  metroNIDAZOLE (FLAGYL) 500 MG tablet Take 1 tablet (500 mg total) by mouth 2 (two) times daily. 03/14/16   Tereso NewcomerUgonna A Anyanwu, MD   Family History Family History  Problem Relation Age of Onset  . Cancer Father     [id number]  . Anesthesia problems Neg Hx   . Hypotension Neg Hx   . Malignant hyperthermia Neg Hx   . Pseudochol deficiency Neg Hx     Social History Social History  Substance Use Topics  . Smoking status: Former Smoker    Packs/day: 0.50    Years: 26.00    Types: Cigarettes  . Smokeless tobacco: Never Used  . Alcohol use No     Comment: occasional    Allergies   Morphine and related and Lactose intolerance (gi)   Review of Systems Review of Systems  Musculoskeletal: Positive for arthralgias and joint swelling.  Neurological: Positive for numbness. Negative for  weakness.   Physical Exam Updated Vital Signs BP 136/93   Pulse 77   Temp 98.2 F (36.8 C) (Oral)   Resp 13   LMP 08/11/2016 (Approximate)   SpO2 100%   Physical Exam  Constitutional: She is oriented to person, place, and time. She appears well-developed and well-nourished.  HENT:  Head: Normocephalic.  Eyes: EOM are normal.  Neck: Normal range of motion.  Pulmonary/Chest: Effort normal.  Abdominal: She exhibits no distension.  Musculoskeletal: Normal range of motion.  Left arm: No obvious swelling or deformity. Diffuse tenderness to palpation of forearm and wrist. FROM of elbow and wrist and fingers. She reports diffuse decreased sensation that does not follow a specific nerve distribution. 2+ radial pulse   Neurological: She is alert and oriented to person, place, and time.  Psychiatric: She has a normal mood and affect.    Nursing note and vitals reviewed.  ED Treatments / Results  DIAGNOSTIC STUDIES:  Oxygen Saturation is 100% on RA, normal by my interpretation.    COORDINATION OF CARE:  5:07 PM Discussed treatment plan with pt at bedside and pt agreed to plan.  Labs (all labs ordered are listed, but only abnormal results are displayed) Labs Reviewed - No data to display  EKG  EKG Interpretation None       Radiology Dg Forearm Left  Result Date: 08/12/2016 CLINICAL DATA:  Distal LEFT forearm EXAM: LEFT FOREARM - 2 VIEW COMPARISON:  None. FINDINGS: No fracture of the radius or ulna. The elbow joint and the wrist joint appear normal on two views. IMPRESSION: No fracture or dislocation. Electronically Signed   By: Genevive Bi M.D.   On: 08/12/2016 16:33   Dg Wrist Complete Left  Result Date: 08/12/2016 CLINICAL DATA:  No recent trauma, forearm and wrist pain EXAM: LEFT WRIST - COMPLETE 3+ VIEW COMPARISON:  Left wrist films of 12/26/2015 FINDINGS: The left radiocarpal joint space appears normal. The carpal bones are normal position with normal alignment. No acute abnormality is seen. IMPRESSION: Negative. Electronically Signed   By: Dwyane Dee M.D.   On: 08/12/2016 16:28   Procedures Procedures (including critical care time)  Medications Ordered in ED Medications - No data to display  Initial Impression / Assessment and Plan / ED Course  I have reviewed the triage vital signs and the nursing notes.  Pertinent labs & imaging results that were available during my care of the patient were reviewed by me and considered in my medical decision making (see chart for details).  41 year old female with symptoms consistent with overuse injury likely from her occupation. Discussed RICE protocol and scheduled NSAIDs. Advised establishing care with PCP if symptoms persist. Referral to [facility] and Wellness given. Return precautions given.  I personally performed the services described in this  documentation, which was scribed in my presence. The recorded information has been reviewed and is accurate.   Final Clinical Impressions(s) / ED Diagnoses   Final diagnoses:  Sprain of left wrist, initial encounter    New Prescriptions New Prescriptions   No medications on file     Bethel Born, PA-C 08/12/16 2353    Mancel Bale, MD 08/14/16 [phone]

## 2016-08-12 NOTE — Discharge Instructions (Signed)
Wear brace while at work and while sleeping Ice or use heat several times a day Take Ibuprofen 600-800mg  three times daily for the next week [facility] if symptoms do not improve

## 2016-08-18 ENCOUNTER — Ambulatory Visit (INDEPENDENT_AMBULATORY_CARE_PROVIDER_SITE_OTHER): Payer: Self-pay | Admitting: Internal Medicine

## 2016-10-30 ENCOUNTER — Encounter (HOSPITAL_COMMUNITY): Payer: Self-pay

## 2016-10-30 ENCOUNTER — Inpatient Hospital Stay (HOSPITAL_COMMUNITY)
Admission: AD | Admit: 2016-10-30 | Discharge: 2016-10-30 | Disposition: A | Discharge status: Home / Self Care | Payer: Self-pay | Source: Ambulatory Visit | Attending: Obstetrics and Gynecology | Admitting: Obstetrics and Gynecology

## 2016-10-30 DIAGNOSIS — Z87440 Personal history of urinary (tract) infections: Secondary | ICD-10-CM | POA: Insufficient documentation

## 2016-10-30 DIAGNOSIS — Z885 Allergy status to narcotic agent status: Secondary | ICD-10-CM | POA: Insufficient documentation

## 2016-10-30 DIAGNOSIS — Z9851 Tubal ligation status: Secondary | ICD-10-CM | POA: Insufficient documentation

## 2016-10-30 DIAGNOSIS — Z3202 Encounter for pregnancy test, result negative: Secondary | ICD-10-CM | POA: Insufficient documentation

## 2016-10-30 DIAGNOSIS — B9689 Other specified bacterial agents as the cause of diseases classified elsewhere: Secondary | ICD-10-CM | POA: Insufficient documentation

## 2016-10-30 DIAGNOSIS — E739 Lactose intolerance, unspecified: Secondary | ICD-10-CM | POA: Insufficient documentation

## 2016-10-30 DIAGNOSIS — R030 Elevated blood-pressure reading, without diagnosis of hypertension: Secondary | ICD-10-CM | POA: Insufficient documentation

## 2016-10-30 DIAGNOSIS — R21 Rash and other nonspecific skin eruption: Secondary | ICD-10-CM | POA: Insufficient documentation

## 2016-10-30 DIAGNOSIS — Z809 Family history of malignant neoplasm, unspecified: Secondary | ICD-10-CM | POA: Insufficient documentation

## 2016-10-30 DIAGNOSIS — Z87891 Personal history of nicotine dependence: Secondary | ICD-10-CM | POA: Insufficient documentation

## 2016-10-30 DIAGNOSIS — N760 Acute vaginitis: Secondary | ICD-10-CM | POA: Insufficient documentation

## 2016-10-30 DIAGNOSIS — Z9889 Other specified postprocedural states: Secondary | ICD-10-CM | POA: Insufficient documentation

## 2016-10-30 LAB — URINALYSIS, ROUTINE W REFLEX MICROSCOPIC
Bilirubin Urine: NEGATIVE
Glucose, UA: NEGATIVE mg/dL
Hgb urine dipstick: NEGATIVE
KETONES UR: NEGATIVE mg/dL
Nitrite: NEGATIVE
PH: 7 (ref 5.0–8.0)
PROTEIN: NEGATIVE mg/dL
Specific Gravity, Urine: 1.008 (ref 1.005–1.030)

## 2016-10-30 LAB — POCT PREGNANCY, URINE: PREG TEST UR: NEGATIVE

## 2016-10-30 LAB — WET PREP, GENITAL
Sperm: NONE SEEN
TRICH WET PREP: NONE SEEN
WBC, Wet Prep HPF POC: NONE SEEN
YEAST WET PREP: NONE SEEN

## 2016-10-30 MED ORDER — TERCONAZOLE 0.4 % VA CREA: 1.0000 | TOPICAL_CREAM | Freq: Every day | VAGINAL | 0 refills | Status: DC

## 2016-10-30 MED ORDER — METRONIDAZOLE 500 MG PO TABS
500.0000 mg | ORAL_TABLET | Freq: Two times a day (BID) | ORAL | 0 refills | Status: DC
Start: 2016-10-30 — End: 2016-12-16

## 2016-10-30 NOTE — MAU Provider Note (Signed)
History     CSN: [id number]  Arrival date and time: 10/30/16 1324   First Provider Initiated Contact with Patient 10/30/16 1440      Chief Complaint  Patient presents with  . Rash   Crystal Hernandez is a 41 y.o. [phone] presenting with one week history of vulvar rash. The rash is burning and burns when urine touches it. She also has vaginal itching and burning. Vaginal discharge is clear to white. No abnormal  vaginal bleeding. Menses regular. She states she used over-the-counter Monistat for a few days and for the last few days has been putting A and D ointment on the area, but has had no relief. Has history of yeast infection once and BV once.  States she's had elevated BPs in the past but no diagnosis chronic hypertension. No PCP.      Past Medical History:  Diagnosis Date  . Abnormal Pap smear   . Depression   . 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 Infection    UTI  . Kidney abscess   . Seasonal allergies   . Vaginal Pap smear, abnormal     Past Surgical History:  Procedure Laterality Date  . ANTERIOR CRUCIATE LIGAMENT REPAIR     rt  . COLPOSCOPY    . KNEE SURGERY    . TUBAL LIGATION    . urethral stint    . WISDOM TOOTH EXTRACTION      Family History  Problem Relation Age of Onset  . Cancer Father        29  . Anesthesia problems Neg Hx   . Hypotension Neg Hx   . Malignant hyperthermia Neg Hx   . Pseudochol deficiency Neg Hx     Social History  Substance Use Topics  . Smoking status: Former Smoker    Packs/day: 0.50    Years: 26.00    Types: Cigarettes  . Smokeless tobacco: Never Used  . Alcohol use No     Comment: occasional     Allergies:  Allergies  Allergen Reactions  . Morphine And Related Itching    Had severe itching and was given benadryl.   . Lactose Intolerance (Gi)     Gi upset     Prescriptions Prior to Admission  Medication Sig Dispense Refill Last Dose  . acidophilus (RISAQUAD) CAPS capsule  Take 1 capsule by mouth daily.   03/13/2016 at Unknown time  . ciprofloxacin (CIPRO) 500 MG tablet Take 1 tablet (500 mg total) by mouth 2 (two) times daily. 6 tablet 2   . metroNIDAZOLE (FLAGYL) 500 MG tablet Take 1 tablet (500 mg total) by mouth 2 (two) times daily. 14 tablet 2     Review of Systems   Physical Exam   Blood pressure (!) 149/89, pulse 77, temperature 98.9 F (37.2 C), temperature source Oral, resp. rate 18, height 5' 4.5" (1.638 m), last menstrual period 10/16/2016, SpO2 100 %.  Physical Exam  Nursing note and vitals reviewed. Constitutional: She is oriented to person, place, and time. She appears well-developed and well-nourished.  HENT:  Head: Normocephalic.  Eyes: No scleral icterus.  Neck: Normal range of motion.  Cardiovascular: Normal rate.   Respiratory: Effort normal.  GI: Soft. There is no tenderness.  Genitourinary:  Genitourinary Comments: Pelvic exam: External genitalia significant for extensive pink flat vulvar and perineal rash extending to perirectal area, with stellate lesions and bilateral excoriated areas. The area is coated with whitish ointment.  Vagina with thin gray discharge, mild erythema Cervix without lesions, mobile Uterus nontender, normal size and shape Adnexa without noted tenderness or masses  Musculoskeletal: Normal range of motion.  Neurological: She is alert and oriented to person, place, and time.  Skin: Skin is warm and dry.  Psychiatric: She has a normal mood and affect. Her behavior is normal. Thought content normal.    MAU Course  Procedures Results for orders placed or performed during the hospital encounter of 10/30/16 (from the past 24 hour(s))  Urinalysis, Routine w reflex microscopic     Status: Abnormal   Collection Time: 10/30/16  1:36 PM  Result Value Ref Range   Color, Urine YELLOW YELLOW   APPearance CLEAR CLEAR   Specific Gravity, Urine 1.008 1.005 - 1.030   pH 7.0 5.0 - 8.0   Glucose, UA NEGATIVE NEGATIVE  mg/dL   Hgb urine dipstick NEGATIVE NEGATIVE   Bilirubin Urine NEGATIVE NEGATIVE   Ketones, ur NEGATIVE NEGATIVE mg/dL   Protein, ur NEGATIVE NEGATIVE mg/dL   Nitrite NEGATIVE NEGATIVE   Leukocytes, UA LARGE (A) NEGATIVE   RBC / HPF 0-5 0 - 5 RBC/hpf   WBC, UA 0-5 0 - 5 WBC/hpf   Bacteria, UA RARE (A) NONE SEEN   Squamous Epithelial / LPF 0-5 (A) NONE SEEN   Mucous PRESENT   Pregnancy, urine POC     Status: None   Collection Time: 10/30/16  1:45 PM  Result Value Ref Range   Preg Test, Ur NEGATIVE NEGATIVE  Wet prep, genital     Status: Abnormal   Collection Time: 10/30/16  2:56 PM  Result Value Ref Range   Yeast Wet Prep HPF POC NONE SEEN NONE SEEN   Trich, Wet Prep NONE SEEN NONE SEEN   Clue Cells Wet Prep HPF POC PRESENT (A) NONE SEEN   WBC, Wet Prep HPF POC NONE SEEN NONE SEEN   Sperm NONE SEEN      Assessment and Plan   1. Perineal rash in female   2. BV (bacterial vaginosis)   3. Elevated BP without diagnosis of hypertension   Presumptive candidal excoriated vulvar rash  Allergies as of 10/30/2016      Reactions   Morphine And Related Itching   Had severe itching and was given benadryl.    Lactose Intolerance (gi)    Gi upset       Medication List    TAKE these medications   acidophilus Caps capsule Take 1 capsule by mouth daily.   metroNIDAZOLE 500 MG tablet Commonly known as:  FLAGYL Take 1 tablet (500 mg total) by mouth 2 (two) times daily.   multivitamin with minerals Tabs tablet Take 1 tablet by mouth daily.   terconazole 0.4 % vaginal cream Commonly known as:  TERAZOL 7 Place 1 applicator vaginally at bedtime.      Information on SItz bath reviewed with patient Information on PCPs given Follow-up Information    [facility], [facility]. Schedule an appointment as soon as possible for a visit in 1 month(s).   Contact information: [address] [place] [address] [phone]           Vijay Durflinger CNM 10/30/2016,  2:41 PM

## 2016-10-30 NOTE — Discharge Instructions (Signed)
How to Take a Sitz Bath °A sitz bath is a warm water bath that is taken while you are sitting down. The water should only come up to your hips and should cover your buttocks. Your health care provider may recommend a sitz bath to help you: °· Clean the lower part of your body, including your genital area. °· With itching. °· With pain. °· With sore muscles or muscles that tighten or spasm. ° °How to take a sitz bath °Take 3-4 sitz baths per day or as told by your health care provider. °1. Partially fill a bathtub with warm water. You will only need the water to be deep enough to cover your hips and buttocks when you are sitting in it. °2. If your health care provider told you to put medicine in the water, follow the directions exactly. °3. Sit in the water and open the tub drain a little. °4. Turn on the warm water again to keep the tub at the correct level. Keep the water running constantly. °5. Soak in the water for 15-20 minutes or as told by your health care provider. °6. After the sitz bath, pat the affected area dry first. Do not rub it. °7. Be careful when you stand up after the sitz bath because you may feel dizzy. ° °Contact a health care provider if: °· Your symptoms get worse. Do not continue with sitz baths if your symptoms get worse. °· You have new symptoms. Do not continue with sitz baths until you talk with your health care provider. °This information is not intended to replace advice given to you by your health care provider. Make sure you discuss any questions you have with your health care provider. °Document Released: 02/09/2004 Document Revised: 10/17/2015 Document Reviewed: 05/17/2014 °Elsevier Interactive Patient Education © 2018 [employer] ° ° ° ° ° °Disposable Sitz Bath °A disposable sitz bath is a plastic basin that fits over the toilet. A bag is hung above the toilet, and the bag is connected to a tube that opens into the basin. The bag is filled with warm water that flows into the  basin through the tube. A sitz bath can be used to help relieve symptoms, clean, and promote healing in the genital and anal areas, as well as in the lower abdomen and buttocks. °What are the risks? °Sitz baths are generally very safe. It is possible for the skin between the genitals and the anus (perineum) to become infected, but this is rare. You can avoid this by cleaning your sitz bath supplies thoroughly. °How to use a disposable sitz bath °1. Close the clamp on the tube. Make sure the clamp is closed tightly to prevent leakage. °2. Fill the sitz bath basin and the plastic bag with warm water. The water should be warm enough to be comfortable, but not hot. °3. Raise the toilet seat and place the filled basin on the toilet. Make sure the overflow opening is facing toward the back of the toilet. °? If you prefer, you may place the empty basin on the toilet first, and then use the plastic bag to fill the basin with warm water. °4. Hang the filled plastic bag overhead on a hook or towel rack close to the toilet. The bag should be higher than the toilet so that the water will flow down through the tube. °5. Attach the tube to the opening on the basin. Make sure that the tube is attached to the basin tightly to prevent leakage. °  6. Sit on the basin and release the clamp. This will allow warm water to flow into the basin and flush the area around your genitals and anus. °7. Remain sitting on the basin for about 15-20 minutes, or as long as told by your health care provider. °8. Stand up and gently pat your skin dry. If directed, apply clean bandages (dressings) to the affected area as told by your health care provider. °9. Carefully remove the basin from the toilet seat and tip the basin into the toilet to empty any remaining water. Empty any remaining water from the plastic bag into the toilet. Then, flush the toilet. °10. Wash the basin with warm water and soap. Let the basin air dry in the sink. You should also let  the plastic bag and the tubing air dry. °11. Store the basin, tubing, and plastic bag in a clean, dry area. °12. Wash your hands with soap and water. If soap and water are not available, use hand sanitizer. °Contact a health care provider if: °· You have symptoms that get worse instead of better. °· You develop new skin irritation, redness, or swelling around your genitals or anus. °This information is not intended to replace advice given to you by your health care provider. Make sure you discuss any questions you have with your health care provider. °Document Released: 11/18/2011 Document Revised: 10/25/2015 Document Reviewed: 04/08/2015 °Elsevier Interactive Patient Education © 2018 Elsevier Inc. ° °

## 2016-10-30 NOTE — MAU Note (Signed)
Pt c/o vaginal rash and irritation that has been off and on for several weeks. Pt states the rash got really bad last week. Pt states she had some vaginal itching and burning. Pt thought she had a yeast infection and started using a cream for that. Pt states the rash started when she started the cream. Pt states she is having normal periods.

## 2016-10-31 LAB — GC/CHLAMYDIA PROBE AMP (~~LOC~~) NOT AT ARMC
Chlamydia: NEGATIVE
NEISSERIA GONORRHEA: NEGATIVE

## 2016-11-03 ENCOUNTER — Ambulatory Visit (INDEPENDENT_AMBULATORY_CARE_PROVIDER_SITE_OTHER): Payer: 59 | Admitting: Internal Medicine

## 2016-11-03 VITALS — BP 103/63 | HR 70 | Temp 97.6°F | Resp 17 | Ht 64.0 in | Wt 128.0 lb

## 2016-11-03 DIAGNOSIS — F900 Attention-deficit hyperactivity disorder, predominantly inattentive type: Secondary | ICD-10-CM

## 2016-11-03 DIAGNOSIS — F5101 Primary insomnia: Secondary | ICD-10-CM

## 2016-11-03 DIAGNOSIS — F419 Anxiety disorder, unspecified: Secondary | ICD-10-CM

## 2016-11-03 MED ORDER — METHYLPHENIDATE HCL ER (OSM) 18 MG PO TBCR
18.0000 mg | EXTENDED_RELEASE_TABLET | Freq: Every day | ORAL | 0 refills | Status: DC
Start: 2016-11-03 — End: 2017-01-29

## 2016-11-03 MED ORDER — BUPROPION HCL 100 MG PO TABS
100.0000 mg | ORAL_TABLET | Freq: Two times a day (BID) | ORAL | 5 refills | Status: DC
Start: 2016-11-03 — End: 2016-12-24

## 2016-11-03 NOTE — Progress Notes (Signed)
Subjective:       Patient ID: Deborah Kirk is a 41 y.o. female with ADD and anxiety requests refill of medications. There has been no change in her condition.    HPI    Anxiety - she takes Wellbutrin 100 mg BID with improvement. Since her son developmental problems have improved she feel much better. She is seeing counselor.  ADD - she is on Concerta ER 18 mg daily. No palpitations, weight loss. She uses Lunesta infrequently for insomnia.    The following portions of the patient's history were reviewed and updated as appropriate: allergies, current medications and problem list.    Review of Systems        Objective:    Physical Exam  BP 103/63   Pulse 70   Temp 97.6 F (36.4 C) (Oral)   Resp 17   Ht 1.626 m (5\' 4" )   Wt 58.1 kg (128 lb)   SpO2 98%   BMI 21.97 kg/m   Constitutional:  Alert, oriented X 3. NAD  Head    Atraumatic, normocephalic    Chest:   Lungs clear to auscultation, good breath sounds  Cardiac:  Normal S1&S2 without murmurs. No edema.  Skin:   Warm, dry, no rashes in visible areas  Psych:   Normal affect, thought content        Assessment:       1. Attention deficit hyperactivity disorder (ADHD), predominantly inattentive type    2. Anxiety    3. Primary insomnia            Plan:      Procedures  Continue current medications, annual influenza vaccine, seat belts, regular dental visits,regular exercise,and healthy diet.

## 2016-11-03 NOTE — Progress Notes (Signed)
Have you seen any specialists/other providers since your last visit with us?    NO      Arm preference verified?     YES     The patient is due for nothing at this time, HM is up-to-date.

## 2016-12-02 ENCOUNTER — Encounter (INDEPENDENT_AMBULATORY_CARE_PROVIDER_SITE_OTHER): Payer: Self-pay | Admitting: Internal Medicine

## 2016-12-09 NOTE — Telephone Encounter (Signed)
Spoke to pt. Pt picked up concerta refill. Pt will call back before end of July.

## 2016-12-16 ENCOUNTER — Inpatient Hospital Stay (HOSPITAL_COMMUNITY)
Admission: AD | Admit: 2016-12-16 | Discharge: 2016-12-16 | Disposition: A | Discharge status: Home / Self Care | Payer: Self-pay | Source: Ambulatory Visit | Attending: Family Medicine | Admitting: Family Medicine

## 2016-12-16 ENCOUNTER — Encounter (HOSPITAL_COMMUNITY): Payer: Self-pay

## 2016-12-16 DIAGNOSIS — R21 Rash and other nonspecific skin eruption: Secondary | ICD-10-CM | POA: Insufficient documentation

## 2016-12-16 DIAGNOSIS — Z3202 Encounter for pregnancy test, result negative: Secondary | ICD-10-CM | POA: Insufficient documentation

## 2016-12-16 DIAGNOSIS — Z87891 Personal history of nicotine dependence: Secondary | ICD-10-CM | POA: Insufficient documentation

## 2016-12-16 DIAGNOSIS — N7689 Other specified inflammation of vagina and vulva: Secondary | ICD-10-CM

## 2016-12-16 DIAGNOSIS — L309 Dermatitis, unspecified: Secondary | ICD-10-CM

## 2016-12-16 LAB — URINALYSIS, ROUTINE W REFLEX MICROSCOPIC
BILIRUBIN URINE: NEGATIVE
GLUCOSE, UA: NEGATIVE mg/dL
KETONES UR: NEGATIVE mg/dL
LEUKOCYTES UA: NEGATIVE
Nitrite: NEGATIVE
PH: 6 (ref 5.0–8.0)
Protein, ur: NEGATIVE mg/dL
Specific Gravity, Urine: 1.009 (ref 1.005–1.030)

## 2016-12-16 LAB — WET PREP, GENITAL
Clue Cells Wet Prep HPF POC: NONE SEEN
SPERM: NONE SEEN
TRICH WET PREP: NONE SEEN
Yeast Wet Prep HPF POC: NONE SEEN

## 2016-12-16 LAB — POCT PREGNANCY, URINE: Preg Test, Ur: NEGATIVE

## 2016-12-16 MED ORDER — NYSTATIN-TRIAMCINOLONE 100000-0.1 UNIT/GM-% EX OINT
TOPICAL_OINTMENT | Freq: Once | CUTANEOUS | Status: AC
  Administered 2016-12-16: 11:00:00 via TOPICAL
  Filled 2016-12-16: qty 15

## 2016-12-16 NOTE — Discharge Instructions (Signed)

## 2016-12-16 NOTE — Progress Notes (Signed)
Crystal Hernandez is an 41 y.o. female c/o persistent vulvar/perineal rash for the past month. Patient was seen and evaluated for same on 10/30/16 and was treated for BV with Flagyl and candidal rash with Terconazole cream. Reports improvement with cream, but then ran out ~1 month ago. Since then, the rash has worsened and continued to be painful at a 9/10. She states that the pain is worse with touch and urination, which causes a burning pain. Denies dysuria, frequency, abdominal pain, nausea, vomiting, diarrhea, constipation, nor chest pain or shortness of breath. She uses Vagisil soap and does not douche. No other changes in lotions, detergents, or creams noted. Patient is currently sexually active with her husband, with whom she is currently separated from 2/2 his infidelity. No known STD exposure, but she has been sexually active with her husband since last encounter, and does not use condoms nor other forms of birth control.  Pertinent Gynecological History: Menses: at baseline Bleeding: regular menstrual bleeding Contraception: none DES exposure: unknown Blood transfusions: none Sexually transmitted diseases: past history: BV Last pap: "Several years ago"               Menstrual History: No LMP recorded.      Past Medical History:  Diagnosis Date  . Abnormal Pap smear   . Depression   . 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 Infection    UTI  . Kidney abscess   . Seasonal allergies   . Vaginal Pap smear, abnormal          Past Surgical History:  Procedure Laterality Date  . ANTERIOR CRUCIATE LIGAMENT REPAIR     rt  . COLPOSCOPY    . KNEE SURGERY    . TUBAL LIGATION    . urethral stint    . WISDOM TOOTH EXTRACTION           Family History  Problem Relation Age of Onset  . Cancer Father        [id number]  . Anesthesia problems Neg Hx   . Hypotension Neg Hx   . Malignant hyperthermia Neg Hx   . Pseudochol  deficiency Neg Hx     Social History:  reports that she quit smoking about 18 months ago. Her smoking use included Cigarettes. She has a 13.00 pack-year smoking history. She has never used smokeless tobacco. She reports that she does not drink alcohol or use drugs.  Allergies:       Allergies  Allergen Reactions  . Morphine And Related Itching    Had severe itching and was given benadryl.   . Lactose Intolerance (Gi)     Gi upset            Prescriptions Prior to Admission  Medication Sig Dispense Refill Last Dose  . acetaminophen (TYLENOL) 500 MG tablet Take 1,000 mg by mouth every 6 (six) hours as needed. Cramps   12/12/2016  . acidophilus (RISAQUAD) CAPS capsule Take 1 capsule by mouth daily.   12/16/2016 at Unknown time  . metroNIDAZOLE (FLAGYL) 500 MG tablet Take 1 tablet (500 mg total) by mouth 2 (two) times daily. (Patient not taking: Reported on 12/16/2016) 14 tablet 0 Completed Course at Unknown time  . terconazole (TERAZOL 7) 0.4 % vaginal cream Place 1 applicator vaginally at bedtime. (Patient not taking: Reported on 12/16/2016) 45 g 0 Completed Course at Unknown time    Review of Systems  Constitutional: Negative for chills and fever.  Respiratory: Negative for shortness of breath.   Cardiovascular: Negative for chest pain.  Gastrointestinal: Negative for abdominal pain, constipation, diarrhea, nausea and vomiting.  Genitourinary: Negative for dysuria, frequency, hematuria and urgency.  Skin: Positive for rash.    BP (!) 144/103 Comment: provider okay with BP  Pulse 65   Temp 98.3 F (36.8 C) (Oral)   Resp 16   Ht 5' 4.5" (1.638 m)   Wt 196 lb 9.6 oz (89.2 kg)   BMI 33.23 kg/m   Physical Exam  Constitutional: She appears well-developed and well-nourished. She is cooperative.  Non-toxic appearance. She does not appear ill. No distress.  HENT:  Head: Normocephalic and atraumatic.  GI: Soft. She exhibits no distension. There is no splenomegaly or  hepatomegaly. There is no tenderness. There is no rigidity, no rebound and no guarding. No hernia.  Genitourinary:    Pelvic exam was performed with patient supine. There is rash (erythema and irritation present to vulva and perineum), tenderness and lesion (ulcerative-appearing lesion to right labia) on the right labia. There is rash and tenderness on the left labia. No erythema, tenderness or bleeding in the vagina. No foreign body in the vagina. No signs of injury around the vagina. Vaginal discharge (brownish-red from cervical os) found.  Neurological: She is alert.    Lab Results Last 24 Hours       Results for orders placed or performed during the hospital encounter of 12/16/16 (from the past 24 hour(s))  Urinalysis, Routine w reflex microscopic     Status: Abnormal   Collection Time: 12/16/16  9:54 AM  Result Value Ref Range   Color, Urine STRAW (A) YELLOW   APPearance CLEAR CLEAR   Specific Gravity, Urine 1.009 1.005 - 1.030   pH 6.0 5.0 - 8.0   Glucose, UA NEGATIVE NEGATIVE mg/dL   Hgb urine dipstick SMALL (A) NEGATIVE   Bilirubin Urine NEGATIVE NEGATIVE   Ketones, ur NEGATIVE NEGATIVE mg/dL   Protein, ur NEGATIVE NEGATIVE mg/dL   Nitrite NEGATIVE NEGATIVE   Leukocytes, UA NEGATIVE NEGATIVE   RBC / HPF 0-5 0 - 5 RBC/hpf   WBC, UA 0-5 0 - 5 WBC/hpf   Bacteria, UA RARE (A) NONE SEEN   Squamous Epithelial / LPF 0-5 (A) NONE SEEN   Mucous PRESENT   Pregnancy, urine POC     Status: None   Collection Time: 12/16/16  9:58 AM  Result Value Ref Range   Preg Test, Ur NEGATIVE NEGATIVE  Wet prep, genital     Status: Abnormal   Collection Time: 12/16/16 10:43 AM  Result Value Ref Range   Yeast Wet Prep HPF POC NONE SEEN NONE SEEN   Trich, Wet Prep NONE SEEN NONE SEEN   Clue Cells Wet Prep HPF POC NONE SEEN NONE SEEN   WBC, Wet Prep HPF POC FEW (A) NONE SEEN   Sperm NONE SEEN       Imaging Results (Last 48 hours)  No results found.     Assessment/Plan:  Patient is a 41 y/o female c/o rash for the past month. Based on history, PE, and pelvic examination, the patient is believed to have continued candidal rash to vulva and perineum. Pregnancy test and wet prep were negative. Due to observation of possibly ulcerative lesion on right labia, and persistence of sx, the patient was swabbed for HSV. She will be called if results are abnormal. She was discharged home in stable condition with Rx and tube of Mycolog topical ointment. Encouraged patient to  continue Sitz baths x2 daily, apply ointment after sitz baths, and to refrain from sexual intercourse until her sx resolve. Recommend not to douche or use new lotions, creams, or soaps on/near rash until sx resolve.  Roxanna Mew, PA-Student    I confirm that I have verified the information documented in the PA student's note and that I have also personally performed the physical exam and all medical decision making activities.  Pt provided with tube of mycolog cream to go home with.  GC/CT & HSV cultures pending Msg to [facility] for f/u appt if symptoms don't improve Patient also is past due for pap smear   Judeth Horn, NP

## 2016-12-16 NOTE — MAU Note (Signed)
Reports bad rash, was in MAU about a month ago for the same thing and provider treated it as a yeast infection. Pt reports she was given a cream and antibiotics. When pt ran out of the cream the rash returned. Reports it is itchy and burns when using the bathroom and when she wipes. Red upon RN observation

## 2016-12-16 NOTE — H&P (Deleted)
Crystal Hernandez is an 41 y.o. female c/o persistent vulvar/perineal rash for the past month. Patient was seen and evaluated for same on 10/30/16 and was treated for BV with Flagyl and candidal rash with Terconazole cream. Reports improvement with cream, but then ran out ~1 month ago. Since then, the rash has worsened and continued to be painful at a 9/10. She states that the pain is worse with touch and urination, which causes a burning pain. Denies dysuria, frequency, abdominal pain, nausea, vomiting, diarrhea, constipation, nor chest pain or shortness of breath. She uses Vagisil soap and does not douche. No other changes in lotions, detergents, or creams noted. Patient is currently sexually active with her husband, with whom she is currently separated from 2/2 his infidelity. No known STD exposure, but she has been sexually active with her husband since last encounter, and does not use condoms nor other forms of birth control.  Pertinent Gynecological History: Menses: at baseline Bleeding: regular menstrual bleeding Contraception: none DES exposure: unknown Blood transfusions: none Sexually transmitted diseases: past history: BV Last pap: "Several years ago"    Menstrual History: No LMP recorded.    Past Medical History:  Diagnosis Date  . Abnormal Pap smear   . Depression   . 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 Infection    UTI  . Kidney abscess   . Seasonal allergies   . Vaginal Pap smear, abnormal     Past Surgical History:  Procedure Laterality Date  . ANTERIOR CRUCIATE LIGAMENT REPAIR     rt  . COLPOSCOPY    . KNEE SURGERY    . TUBAL LIGATION    . urethral stint    . WISDOM TOOTH EXTRACTION      Family History  Problem Relation Age of Onset  . Cancer Father        46  . Anesthesia problems Neg Hx   . Hypotension Neg Hx   . Malignant hyperthermia Neg Hx   . Pseudochol deficiency Neg Hx     Social History:  reports that she quit  smoking about 18 months ago. Her smoking use included Cigarettes. She has a 13.00 pack-year smoking history. She has never used smokeless tobacco. She reports that she does not drink alcohol or use drugs.  Allergies:  Allergies  Allergen Reactions  . Morphine And Related Itching    Had severe itching and was given benadryl.   . Lactose Intolerance (Gi)     Gi upset     Prescriptions Prior to Admission  Medication Sig Dispense Refill Last Dose  . acetaminophen (TYLENOL) 500 MG tablet Take 1,000 mg by mouth every 6 (six) hours as needed. Cramps   12/12/2016  . acidophilus (RISAQUAD) CAPS capsule Take 1 capsule by mouth daily.   12/16/2016 at Unknown time  . metroNIDAZOLE (FLAGYL) 500 MG tablet Take 1 tablet (500 mg total) by mouth 2 (two) times daily. (Patient not taking: Reported on 12/16/2016) 14 tablet 0 Completed Course at Unknown time  . terconazole (TERAZOL 7) 0.4 % vaginal cream Place 1 applicator vaginally at bedtime. (Patient not taking: Reported on 12/16/2016) 45 g 0 Completed Course at Unknown time    Review of Systems  Constitutional: Negative for chills and fever.  Respiratory: Negative for shortness of breath.   Cardiovascular: Negative for chest pain.  Gastrointestinal: Negative for abdominal pain, constipation, diarrhea, nausea and vomiting.  Genitourinary: Negative for dysuria, frequency, hematuria and urgency.  Skin:  Positive for rash.    Blood pressure (!) 144/103, pulse 65, temperature 98.3 F (36.8 C), temperature source Oral, resp. rate 16, height 5' 4.5" (1.638 m), weight 196 lb 9.6 oz (89.2 kg). Physical Exam  Constitutional: She appears well-developed and well-nourished. She is cooperative.  Non-toxic appearance. She does not appear ill. No distress.  HENT:  Head: Normocephalic and atraumatic.  GI: Soft. She exhibits no distension. There is no splenomegaly or hepatomegaly. There is no tenderness. There is no rigidity, no rebound and no guarding. No hernia.    Genitourinary:    Pelvic exam was performed with patient supine. There is rash (erythema and irritation present to vulva and perineum), tenderness and lesion (ulcerative-appearing lesion to right labia) on the right labia. There is rash and tenderness on the left labia. No erythema, tenderness or bleeding in the vagina. No foreign body in the vagina. No signs of injury around the vagina. Vaginal discharge (brownish-red from cervical os) found.  Neurological: She is alert.    Results for orders placed or performed during the hospital encounter of 12/16/16 (from the past 24 hour(s))  Urinalysis, Routine w reflex microscopic     Status: Abnormal   Collection Time: 12/16/16  9:54 AM  Result Value Ref Range   Color, Urine STRAW (A) YELLOW   APPearance CLEAR CLEAR   Specific Gravity, Urine 1.009 1.005 - 1.030   pH 6.0 5.0 - 8.0   Glucose, UA NEGATIVE NEGATIVE mg/dL   Hgb urine dipstick SMALL (A) NEGATIVE   Bilirubin Urine NEGATIVE NEGATIVE   Ketones, ur NEGATIVE NEGATIVE mg/dL   Protein, ur NEGATIVE NEGATIVE mg/dL   Nitrite NEGATIVE NEGATIVE   Leukocytes, UA NEGATIVE NEGATIVE   RBC / HPF 0-5 0 - 5 RBC/hpf   WBC, UA 0-5 0 - 5 WBC/hpf   Bacteria, UA RARE (A) NONE SEEN   Squamous Epithelial / LPF 0-5 (A) NONE SEEN   Mucous PRESENT   Pregnancy, urine POC     Status: None   Collection Time: 12/16/16  9:58 AM  Result Value Ref Range   Preg Test, Ur NEGATIVE NEGATIVE  Wet prep, genital     Status: Abnormal   Collection Time: 12/16/16 10:43 AM  Result Value Ref Range   Yeast Wet Prep HPF POC NONE SEEN NONE SEEN   Trich, Wet Prep NONE SEEN NONE SEEN   Clue Cells Wet Prep HPF POC NONE SEEN NONE SEEN   WBC, Wet Prep HPF POC FEW (A) NONE SEEN   Sperm NONE SEEN     No results found.  Assessment/Plan:  Patient is a 41 y/o female c/o rash for the past month. Based on history, PE, and pelvic examination, the patient is believed to have continued candidal rash to vulva and perineum. Pregnancy  test and wet prep were negative. Due to observation of possibly ulcerative lesion on right labia, and persistence of sx, the patient was swabbed for HSV. She will be called if results are abnormal. She was discharged home in stable condition with Rx and tube of Mycolog topical ointment. Encouraged patient to continue Sitz baths x2 daily, apply ointment after sitz baths, and to refrain from sexual intercourse until her sx resolve. Recommend not to douche or use new lotions, creams, or soaps on/near rash until sx resolve.  Lucendia HerrlichLaura Karrah Hernandez 12/16/2016, 11:42 AM

## 2016-12-17 LAB — GC/CHLAMYDIA PROBE AMP (~~LOC~~) NOT AT ARMC
Chlamydia: NEGATIVE
Neisseria Gonorrhea: NEGATIVE

## 2016-12-18 LAB — HSV CULTURE AND TYPING

## 2016-12-19 ENCOUNTER — Encounter (INDEPENDENT_AMBULATORY_CARE_PROVIDER_SITE_OTHER): Payer: Self-pay

## 2016-12-19 ENCOUNTER — Telehealth (INDEPENDENT_AMBULATORY_CARE_PROVIDER_SITE_OTHER): Payer: Self-pay

## 2016-12-19 ENCOUNTER — Encounter (INDEPENDENT_AMBULATORY_CARE_PROVIDER_SITE_OTHER): Payer: Self-pay | Admitting: Internal Medicine

## 2016-12-19 NOTE — Telephone Encounter (Signed)
Called pt to advise concerta prior authorization approved from 12/19/2016-06/20/2017.

## 2016-12-24 ENCOUNTER — Other Ambulatory Visit (INDEPENDENT_AMBULATORY_CARE_PROVIDER_SITE_OTHER): Payer: Self-pay | Admitting: Internal Medicine

## 2016-12-25 MED ORDER — BUPROPION HCL 100 MG PO TABS
100.0000 mg | ORAL_TABLET | Freq: Two times a day (BID) | ORAL | 5 refills | Status: DC
Start: 2016-12-25 — End: 2017-01-29

## 2017-01-29 ENCOUNTER — Encounter (INDEPENDENT_AMBULATORY_CARE_PROVIDER_SITE_OTHER): Payer: Self-pay | Admitting: Internal Medicine

## 2017-01-29 ENCOUNTER — Ambulatory Visit (INDEPENDENT_AMBULATORY_CARE_PROVIDER_SITE_OTHER): Payer: 59 | Admitting: Internal Medicine

## 2017-01-29 VITALS — BP 92/65 | HR 70 | Temp 97.7°F | Wt 129.2 lb

## 2017-01-29 DIAGNOSIS — F900 Attention-deficit hyperactivity disorder, predominantly inattentive type: Secondary | ICD-10-CM

## 2017-01-29 DIAGNOSIS — F5101 Primary insomnia: Secondary | ICD-10-CM

## 2017-01-29 DIAGNOSIS — F419 Anxiety disorder, unspecified: Secondary | ICD-10-CM

## 2017-01-29 MED ORDER — METHYLPHENIDATE HCL ER (OSM) 18 MG PO TBCR
18.0000 mg | EXTENDED_RELEASE_TABLET | Freq: Every day | ORAL | 0 refills | Status: DC
Start: 2017-01-29 — End: 2017-04-29

## 2017-01-29 MED ORDER — ESZOPICLONE 1 MG PO TABS
1.0000 mg | ORAL_TABLET | Freq: Every evening | ORAL | 3 refills | Status: AC
Start: 2017-01-29 — End: ?

## 2017-01-29 MED ORDER — BUPROPION HCL 100 MG PO TABS
100.0000 mg | ORAL_TABLET | Freq: Two times a day (BID) | ORAL | 5 refills | Status: DC
Start: 2017-01-29 — End: 2017-04-29

## 2017-01-29 NOTE — Patient Instructions (Signed)
CBT-I (cognitive-behavioral therapy for insomnia) is the only scientifically proven non-drug insomnia treatment. CBT-I improves sleep in 70-80% of patients, is more effective than sleeping pills and reduces or eliminates sleeping pills in the vast majority of patients. Learn more.

## 2017-01-29 NOTE — Progress Notes (Signed)
Have you seen any new specialists/physicians since you were last here?no      Limb alert protocol reviewed?  Yes    Limb questionnaire reviewed.   Patient denies any limb restrictions.

## 2017-01-29 NOTE — Progress Notes (Signed)
Subjective:       Patient ID: Deborah Kirk is a 41 y.o. female  with ADD and anxiety requests refill of medications.     HPI  Anxiety - she takes Wellbutrin 100 mg BID with improvement. Since her son developmental problems have improved she feel much better. She is seeing counselor.  ADD - she is on Concerta ER 18 mg daily. No palpitations, weight loss. She uses Lunesta infrequently for insomnia.  Insomnia - she c/o recent insomnia and request Lunesta.    The following portions of the patient's history were reviewed and updated as appropriate: allergies, current medications and problem list.    Review of Systems        Objective:    Physical Exam  BP 92/65   Pulse 70   Temp 97.7 F (36.5 C) (Oral)   Wt 58.6 kg (129 lb 3.2 oz)   BMI 22.18 kg/m   Constitutional:  Alert, oriented X 3. NAD  Head    Atraumatic, normocephalic  Eye:   Conjunctival - pinkod breath sounds  Cardiac:  Normal S1&S2 without murmurs. No edema.  Skin:   Warm, dry, no rashes in visible areas  Psych:   Normal affect, thought content        Assessment:       1. Attention deficit hyperactivity disorder (ADHD), predominantly inattentive type     2. Anxiety     3. Primary insomnia             Plan:      Procedures  Renew medications.    Discussion of CBT for insomnia.

## 2017-04-13 ENCOUNTER — Inpatient Hospital Stay (HOSPITAL_COMMUNITY)
Admission: AD | Admit: 2017-04-13 | Discharge: 2017-04-13 | Disposition: A | Discharge status: Home / Self Care | Payer: BLUE CROSS/BLUE SHIELD | Source: Ambulatory Visit | Attending: Family Medicine | Admitting: Family Medicine

## 2017-04-13 ENCOUNTER — Encounter (HOSPITAL_COMMUNITY): Payer: Self-pay | Admitting: *Deleted

## 2017-04-13 ENCOUNTER — Other Ambulatory Visit: Payer: Self-pay | Admitting: Nurse Practitioner

## 2017-04-13 DIAGNOSIS — Z3202 Encounter for pregnancy test, result negative: Secondary | ICD-10-CM | POA: Insufficient documentation

## 2017-04-13 DIAGNOSIS — L308 Other specified dermatitis: Secondary | ICD-10-CM | POA: Insufficient documentation

## 2017-04-13 DIAGNOSIS — Z87891 Personal history of nicotine dependence: Secondary | ICD-10-CM | POA: Insufficient documentation

## 2017-04-13 DIAGNOSIS — L309 Dermatitis, unspecified: Secondary | ICD-10-CM

## 2017-04-13 DIAGNOSIS — R21 Rash and other nonspecific skin eruption: Secondary | ICD-10-CM | POA: Diagnosis present

## 2017-04-13 LAB — COMPREHENSIVE METABOLIC PANEL
ALT: 45 U/L (ref 14–54)
AST: 30 U/L (ref 15–41)
Albumin: 3.9 g/dL (ref 3.5–5.0)
Alkaline Phosphatase: 90 U/L (ref 38–126)
Anion gap: 7 (ref 5–15)
BILIRUBIN TOTAL: 0.4 mg/dL (ref 0.3–1.2)
BUN: 13 mg/dL (ref 6–20)
CO2: 23 mmol/L (ref 22–32)
Calcium: 8.8 mg/dL — ABNORMAL LOW (ref 8.9–10.3)
Chloride: 103 mmol/L (ref 101–111)
Creatinine, Ser: 0.66 mg/dL (ref 0.44–1.00)
GFR calc Af Amer: >60 mL/min (ref >60)
Glucose, Bld: 102 mg/dL — ABNORMAL HIGH (ref 65–99)
POTASSIUM: 3.8 mmol/L (ref 3.5–5.1)
Sodium: 133 mmol/L — ABNORMAL LOW (ref 135–145)
TOTAL PROTEIN: 7.3 g/dL (ref 6.5–8.1)

## 2017-04-13 LAB — URINALYSIS, ROUTINE W REFLEX MICROSCOPIC
Bilirubin Urine: NEGATIVE
Glucose, UA: NEGATIVE mg/dL
Hgb urine dipstick: NEGATIVE
Ketones, ur: NEGATIVE mg/dL
Leukocytes, UA: NEGATIVE
Nitrite: NEGATIVE
Protein, ur: NEGATIVE mg/dL
SPECIFIC GRAVITY, URINE: 1.005 (ref 1.005–1.030)
pH: 7 (ref 5.0–8.0)

## 2017-04-13 LAB — WET PREP, GENITAL
Sperm: NONE SEEN
Trich, Wet Prep: NONE SEEN
YEAST WET PREP: NONE SEEN

## 2017-04-13 LAB — POCT PREGNANCY, URINE: Preg Test, Ur: NEGATIVE

## 2017-04-13 MED ORDER — TRIAMCINOLONE ACETONIDE 0.1 % EX OINT
TOPICAL_OINTMENT | Freq: Once | CUTANEOUS | Status: AC
  Administered 2017-04-13: 15:00:00 via TOPICAL
  Filled 2017-04-13: qty 15

## 2017-04-13 MED ORDER — CLOBETASOL PROP EMOLLIENT BASE 0.05 % EX CREA: TOPICAL_CREAM | CUTANEOUS | 2 refills | Status: DC

## 2017-04-13 MED ORDER — TRIAMCINOLONE ACETONIDE 0.5 % EX OINT
1.0000 | TOPICAL_OINTMENT | Freq: Two times a day (BID) | CUTANEOUS | 4 refills | Status: DC
Start: 2017-04-13 — End: 2018-08-09

## 2017-04-13 MED ORDER — CLOBETASOL PROPIONATE 0.05 % EX OINT: TOPICAL_OINTMENT | Freq: Every day | CUTANEOUS | Status: DC

## 2017-04-13 NOTE — Discharge Instructions (Signed)
Get your medication from the pharmacy and use at bedtime daily. Try not to scratch. 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 Expect the hospital to call you to schedule an appointment to confirm your diagnosis and give further follow up. [facility].

## 2017-04-13 NOTE — Progress Notes (Unsigned)
Ordered triamcinolone cream because client may not be able to afford clobetesol cream already sent to her pharmacy.  Discussed with client to see which one may be less expensive however I would expect the clobetasol cream would work better for her.  Nolene BernheimERRI BURLESON, RN, MSN, NP-BC Nurse Practitioner, [facility], [facility] 04/13/2017 2:20 PM
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
## 2017-04-13 NOTE — MAU Note (Signed)
States has been here 2 times for the same rash. States is like a cut from the front to the back.  Hurts to pee or wipe.  States vagina is really irritated and has broken skin.

## 2017-04-14 LAB — GC/CHLAMYDIA PROBE AMP (~~LOC~~) NOT AT ARMC
Chlamydia: NEGATIVE
NEISSERIA GONORRHEA: NEGATIVE

## 2017-04-14 LAB — RPR: RPR Ser Ql: NONREACTIVE

## 2017-04-14 LAB — HIV ANTIBODY (ROUTINE TESTING W REFLEX): HIV SCREEN 4TH GENERATION: NONREACTIVE

## 2017-04-22 ENCOUNTER — Encounter: Payer: Self-pay | Admitting: General Practice

## 2017-04-29 ENCOUNTER — Encounter (INDEPENDENT_AMBULATORY_CARE_PROVIDER_SITE_OTHER): Payer: Self-pay | Admitting: Family Medicine

## 2017-04-29 ENCOUNTER — Other Ambulatory Visit: Payer: 59

## 2017-04-29 ENCOUNTER — Ambulatory Visit (INDEPENDENT_AMBULATORY_CARE_PROVIDER_SITE_OTHER): Payer: 59 | Admitting: Family Medicine

## 2017-04-29 VITALS — BP 103/66 | HR 86 | Temp 98.1°F | Wt 130.0 lb

## 2017-04-29 DIAGNOSIS — F900 Attention-deficit hyperactivity disorder, predominantly inattentive type: Secondary | ICD-10-CM

## 2017-04-29 DIAGNOSIS — Z8639 Personal history of other endocrine, nutritional and metabolic disease: Secondary | ICD-10-CM

## 2017-04-29 DIAGNOSIS — F329 Major depressive disorder, single episode, unspecified: Secondary | ICD-10-CM

## 2017-04-29 DIAGNOSIS — F419 Anxiety disorder, unspecified: Secondary | ICD-10-CM

## 2017-04-29 DIAGNOSIS — J157 Pneumonia due to Mycoplasma pneumoniae: Secondary | ICD-10-CM

## 2017-04-29 DIAGNOSIS — F32A Depression, unspecified: Secondary | ICD-10-CM

## 2017-04-29 MED ORDER — BUPROPION HCL 75 MG PO TABS
75.0000 mg | ORAL_TABLET | Freq: Two times a day (BID) | ORAL | 1 refills | Status: DC
Start: 2017-04-29 — End: 2017-05-21

## 2017-04-29 MED ORDER — AMOXICILLIN-POT CLAVULANATE 875-125 MG PO TABS
1.0000 | ORAL_TABLET | Freq: Two times a day (BID) | ORAL | 0 refills | Status: AC
Start: 2017-04-29 — End: 2017-05-09

## 2017-04-29 MED ORDER — METHYLPHENIDATE HCL ER (OSM) 18 MG PO TBCR
18.0000 mg | EXTENDED_RELEASE_TABLET | Freq: Every day | ORAL | 0 refills | Status: DC
Start: 2017-04-29 — End: 2017-05-21

## 2017-04-29 NOTE — Progress Notes (Signed)
Subjective:     Patient ID:  Deborah Kirk is a 41 y.o. female.      41 yo here today for follow up of multiple concerns.  History of ADD.  Has been on Concerta 18mg  daily.  Also takes Wellbutrin 100mg  BID.  Takes Lunesta to help with sleep on occasion.  Has been feeling more down than usual.  Unsure of triggers.    Son has mycoplasma pneumonia for a year.  She wonders if she is affected at this point.    History of Hashimoto's thyroiditis.  Not currently on medication.    Reports sinus infection 2 weeks ago.  S/p Azithromycin.      Sinusitis   This is a new problem. The current episode started 1 to 4 weeks ago (2 weeks). The problem has been waxing and waning since onset. There has been no fever. The pain is mild. Associated symptoms include congestion, headaches and sinus pressure. Pertinent negatives include no chills, coughing, ear pain, shortness of breath or sore throat. Past treatments include oral decongestants. The treatment provided mild relief.       Current Outpatient Prescriptions   Medication Sig   . buPROPion (WELLBUTRIN) 100 MG tablet Take 1 tablet (100 mg total) by mouth 2 (two) times daily.   . eszopiclone (LUNESTA) 1 MG tablet Take 1 tablet (1 mg total) by mouth nightly.Take immediately before bedtime   . methylphenidate (CONCERTA) 18 MG CR tablet Take 1 tablet (18 mg total) by mouth daily.   . Multiple Vitamin (MULTIVITAMIN) capsule Take 1 capsule by mouth daily.         The following portions of the patient's history were reviewed and updated as appropriate: allergies, current medications, past medical history and problem list.      Review of Systems   Constitutional: Negative for chills, fatigue and fever.   HENT: Positive for congestion and sinus pressure. Negative for ear pain and sore throat.    Respiratory: Negative for cough, shortness of breath and wheezing.    Cardiovascular: Negative for chest pain and palpitations.   Neurological: Positive for headaches.   All other systems  reviewed and are negative.      BP 103/66   Pulse 86   Temp 98.1 F (36.7 C) (Oral)   Wt 59 kg (130 lb)   LMP 04/28/2017 (Exact Date)   BMI 22.31 kg/m      Objective:   Physical Exam   Constitutional: She appears well-developed and well-nourished. No distress.   Cardiovascular: Normal rate, regular rhythm and normal heart sounds.  Exam reveals no gallop and no friction rub.    No murmur heard.  Pulmonary/Chest: Effort normal and breath sounds normal. No respiratory distress. She has no wheezes. She has no rales.   Musculoskeletal: She exhibits no edema.   Nursing note and vitals reviewed.         Assessment:       ICD-10-CM    1. Attention deficit hyperactivity disorder (ADHD), predominantly inattentive type F90.0 methylphenidate (CONCERTA) 18 MG CR tablet   2. Depression, unspecified depression type F32.9    3. Anxiety F41.9    4. History of Hashimoto thyroiditis Z86.39 TSH     T3, free     T4, free   5. Pneumonia due to Mycoplasma pneumoniae, unspecified laterality, unspecified part of lung J15.7 Mycoplasma pneumoniae antibody, IgG     Mycoplasma pneumoniae antibody, IgM       Plan:     1.  Chronic,  refill Concerta.  Follow up in 3 months.    2.3.  Chronic, trial of Wellbutrin as potential adjunct to Concerta.    4.  Check TSH.  Consider treatment with thyroid replacement if needed.    5.  Check labs as above.      Risk & benefits of any medication(s) given/prescribed were explained to the patient (and/or family) who verbalized understanding & agreed to the treatment plan.  Patient (and/or family) encouraged to contact me/clinical staff with any questions/concerns.      Procedures  :none

## 2017-04-30 LAB — TSH: TSH: 1.14 u[IU]/mL (ref 0.450–4.500)

## 2017-04-30 LAB — T3, FREE: T3, Free: 3.1 pg/mL (ref 2.0–4.4)

## 2017-04-30 LAB — T4, FREE: T4, Free: 1.17 ng/dL (ref 0.82–1.77)

## 2017-05-01 LAB — MYCOPLASMA PNEUMONIAE ANTIBODY, IGM: Mycoplasma pneumo IgM: <770 U/mL (ref 0–769)

## 2017-05-01 LAB — MYCOPLASMA PNEUMONIAE ANTIBODY, IGG: M. pneumoniae Ab, IgG, S: 123 U/mL — ABNORMAL HIGH (ref 0–99)

## 2017-05-14 ENCOUNTER — Encounter: Payer: Self-pay | Admitting: Obstetrics and Gynecology

## 2017-05-21 ENCOUNTER — Ambulatory Visit (INDEPENDENT_AMBULATORY_CARE_PROVIDER_SITE_OTHER): Payer: 59 | Admitting: Family Medicine

## 2017-05-21 ENCOUNTER — Encounter (INDEPENDENT_AMBULATORY_CARE_PROVIDER_SITE_OTHER): Payer: Self-pay | Admitting: Family Medicine

## 2017-05-21 VITALS — BP 103/67 | HR 77 | Temp 98.0°F | Wt 131.0 lb

## 2017-05-21 DIAGNOSIS — F419 Anxiety disorder, unspecified: Secondary | ICD-10-CM

## 2017-05-21 DIAGNOSIS — F900 Attention-deficit hyperactivity disorder, predominantly inattentive type: Secondary | ICD-10-CM

## 2017-05-21 MED ORDER — METHYLPHENIDATE HCL ER (OSM) 27 MG PO TBCR
27.00 mg | EXTENDED_RELEASE_TABLET | Freq: Every day | ORAL | 0 refills | Status: DC
Start: 2017-05-21 — End: 2017-06-09

## 2017-05-21 NOTE — Progress Notes (Signed)
Subjective:     Patient ID:  Deborah Kirk is a 41 y.o. female.      41 yo here today for follow up of ADHD and anxiety.  On last visit about a month ago, Wellbutrin was added to Concerta as potential synergistic effect.  Patient was taking a previous dose of Concerta at 27mg  daily.  States this dose works better for her.  She asks about potential increase in Wellbutrin.  Denies headache, nausea, vomiting, change in appetite.        Current Outpatient Prescriptions   Medication Sig   . buPROPion (WELLBUTRIN) 100 MG tablet Take 100 mg by mouth 2 (two) times daily.   . eszopiclone (LUNESTA) 1 MG tablet Take 1 tablet (1 mg total) by mouth nightly.Take immediately before bedtime   . methylphenidate (CONCERTA) 27 MG CR tablet Take 1 tablet (27 mg total) by mouth daily.   . Multiple Vitamin (MULTIVITAMIN) capsule Take 1 capsule by mouth daily.         The following portions of the patient's history were reviewed and updated as appropriate: allergies, current medications, past medical history and problem list.      Review of Systems   Constitutional: Negative for chills, fatigue and fever.   Respiratory: Negative for cough, shortness of breath and wheezing.    Cardiovascular: Negative for chest pain and palpitations.   Psychiatric/Behavioral: Positive for decreased concentration.   All other systems reviewed and are negative.      BP 103/67   Pulse 77   Temp 98 F (36.7 C) (Oral)   Wt 59.4 kg (131 lb)   LMP 04/28/2017 (Exact Date)   BMI 22.49 kg/m      Objective:   Physical Exam   Constitutional: She appears well-developed and well-nourished. No distress.   Cardiovascular: Normal rate.    Pulmonary/Chest: Effort normal.   Nursing note and vitals reviewed.         Assessment:       ICD-10-CM    1. Attention deficit hyperactivity disorder (ADHD), predominantly inattentive type F90.0 methylphenidate (CONCERTA) 27 MG CR tablet   2. Anxiety F41.9        Plan:     1.  Chronic, continue with increased Concerta at  27mg  daily.  Follow up in 3 months.    2.  Chronic, increase Wellbutrin to 100mg  BID.      Risk & benefits of any medication(s) given/prescribed were explained to the patient (and/or family) who verbalized understanding & agreed to the treatment plan.  Patient (and/or family) encouraged to contact me/clinical staff with any questions/concerns.      Procedures  :none

## 2017-05-27 ENCOUNTER — Encounter (INDEPENDENT_AMBULATORY_CARE_PROVIDER_SITE_OTHER): Payer: Self-pay | Admitting: Family Medicine

## 2017-05-31 ENCOUNTER — Emergency Department (HOSPITAL_COMMUNITY)
Admission: EM | Admit: 2017-05-31 | Discharge: 2017-05-31 | Disposition: A | Discharge status: Home / Self Care | Payer: BLUE CROSS/BLUE SHIELD | Attending: Emergency Medicine | Admitting: Emergency Medicine

## 2017-05-31 ENCOUNTER — Other Ambulatory Visit: Payer: Self-pay

## 2017-05-31 ENCOUNTER — Encounter (HOSPITAL_COMMUNITY): Payer: Self-pay | Admitting: *Deleted

## 2017-05-31 DIAGNOSIS — J019 Acute sinusitis, unspecified: Secondary | ICD-10-CM | POA: Diagnosis not present

## 2017-05-31 DIAGNOSIS — Z87891 Personal history of nicotine dependence: Secondary | ICD-10-CM | POA: Insufficient documentation

## 2017-05-31 DIAGNOSIS — Z79899 Other long term (current) drug therapy: Secondary | ICD-10-CM | POA: Diagnosis not present

## 2017-05-31 DIAGNOSIS — R51 Headache: Secondary | ICD-10-CM | POA: Diagnosis present

## 2017-05-31 MED ORDER — AMOXICILLIN 500 MG PO CAPS: 500.0000 mg | ORAL_CAPSULE | Freq: Three times a day (TID) | ORAL | 0 refills | Status: DC

## 2017-05-31 NOTE — ED Notes (Signed)
Bed: WA03 Expected date:  Expected time:  Means of arrival:  Comments: 

## 2017-05-31 NOTE — ED Triage Notes (Signed)
Pt has had facial/sinus on rt side of face ofor several days, now has puffiness under rt eye.

## 2017-05-31 NOTE — ED Provider Notes (Signed)
[facility]-EMERGENCY DEPT Provider Note   CSN: [id number] Arrival date & time: 05/31/17  [id number]     History   Chief Complaint Chief Complaint  Patient presents with  . Facial Pain    HPI Crystal Hernandez is a 41 y.o. female.  HPI   41 year old female with facial pain and some mild swelling around her right eye.  5-6 days ago she began having pain in her frontal region and around her nose.  She attributed this to sinusitis.  In the past 24 hours she noticed some mild swelling below her right eye.  No change in visual acuity.  No eye pain.  No drainage.  Past Medical History:  Diagnosis Date  . Abnormal Pap smear   . Depression   . 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 Infection    UTI  . Kidney abscess   . Seasonal allergies   . Vaginal Pap smear, abnormal     There are no active problems to display for this patient.   Past Surgical History:  Procedure Laterality Date  . ANTERIOR CRUCIATE LIGAMENT REPAIR     rt  . COLPOSCOPY    . KNEE SURGERY    . TUBAL LIGATION    . urethral stint    . WISDOM TOOTH EXTRACTION      OB History    Gravida Para Term Preterm AB Living   3 3 3     3    SAB TAB Ectopic Multiple Live Births                   Home Medications    Prior to Admission medications   Medication Sig Start Date End Date Taking? Authorizing Provider  acetaminophen (TYLENOL) 500 MG tablet Take 1,000 mg by mouth every 6 (six) hours as needed. Cramps    [provider]  acidophilus (RISAQUAD) CAPS capsule Take 1 capsule by mouth daily.    [provider]  amoxicillin (AMOXIL) 500 MG capsule Take 1 capsule (500 mg total) by mouth 3 (three) times daily. 05/31/17   Raeford RazorKohut, Sreshta Cressler, MD  Clobetasol Prop Emollient Base 0.05 % emollient cream Apply to affected area at bedtime daily. 04/13/17   Burleson, Brand Maleserri L, NP  triamcinolone ointment (KENALOG) 0.5 % Apply 1 application 2 (two) times daily  topically. 04/13/17   Currie ParisBurleson, Terri L, NP    Family History Family History  Problem Relation Age of Onset  . Cancer Father        [id number]  . Anesthesia problems Neg Hx   . Hypotension Neg Hx   . Malignant hyperthermia Neg Hx   . Pseudochol deficiency Neg Hx     Social History Social History   Tobacco Use  . Smoking status: Former Smoker    Packs/day: 0.50    Years: 26.00    Pack years: 13.00    Types: Cigarettes    Last attempt to quit: 06/07/2015    Years since quitting: 1.9  . Smokeless tobacco: Never Used  Substance Use Topics  . Alcohol use: Yes    Alcohol/week: 0.0 oz    Comment: occasional   . Drug use: No     Allergies   Morphine and related and Lactose intolerance (gi)   Review of Systems Review of Systems  All systems reviewed and negative, other than as noted in HPI.   Physical Exam Updated Vital Signs BP (!) 154/100 (BP Location: Right Arm)  Pulse (!) 107   Temp 98.6 F (37 C) (Oral)   Resp 16   Ht 5\' 4"  (1.626 m)   Wt 89.8 kg (198 lb)   SpO2 99%   BMI 33.99 kg/m   Physical Exam  Constitutional: She appears well-developed and well-nourished. No distress.  HENT:  Head: Normocephalic.  Mild puffiness of R lower eyelid. TTP frontal sinuses.   Eyes: Conjunctivae and EOM are normal. Pupils are equal, round, and reactive to light. Right eye exhibits no discharge. Left eye exhibits no discharge.  No proptosis. Denies any pan with eye movement. No drainage.   Neck: Neck supple.  Cardiovascular: Normal rate, regular rhythm and normal heart sounds. Exam reveals no gallop and no friction rub.  No murmur heard. Pulmonary/Chest: Effort normal and breath sounds normal. No respiratory distress.  Abdominal: Soft. She exhibits no distension. There is no tenderness.  Musculoskeletal: She exhibits no edema or tenderness.  Neurological: She is alert.  Skin: Skin is warm and dry.  Psychiatric: She has a normal mood and affect. Her behavior is normal.  Thought content normal.  Nursing note and vitals reviewed.    ED Treatments / Results  Labs (all labs ordered are listed, but only abnormal results are displayed) Labs Reviewed - No data to display  EKG  EKG Interpretation None       Radiology No results found.  Procedures Procedures (including critical care time)  Medications Ordered in ED Medications - No data to display   Initial Impression / Assessment and Plan / ED Course  I have reviewed the triage vital signs and the nursing notes.  Pertinent labs & imaging results that were available during my care of the patient were reviewed by me and considered in my medical decision making (see chart for details).     41yF with likely sinusitis. Given symptoms of almost a week, will give course of amox. OTC decongestants. It has been determined that no acute conditions requiring further emergency intervention are present at this time. The patient has been advised of the diagnosis and plan. I reviewed any labs and imaging including any potential incidental findings. We have discussed signs and symptoms that warrant return to the ED and they are listed in the discharge instructions.    Final Clinical Impressions(s) / ED Diagnoses   Final diagnoses:  Acute sinusitis, recurrence not specified, unspecified location    ED Discharge Orders        Ordered    amoxicillin (AMOXIL) 500 MG capsule  3 times daily     05/31/17 0826       Raeford RazorKohut, Saxon Barich, MD 05/31/17 [phone]

## 2017-06-09 ENCOUNTER — Other Ambulatory Visit (INDEPENDENT_AMBULATORY_CARE_PROVIDER_SITE_OTHER): Payer: Self-pay | Admitting: Family Medicine

## 2017-06-09 DIAGNOSIS — F900 Attention-deficit hyperactivity disorder, predominantly inattentive type: Secondary | ICD-10-CM

## 2017-06-09 NOTE — Telephone Encounter (Signed)
Refill RX sent to PCP

## 2017-06-09 NOTE — Telephone Encounter (Signed)
Name, strength, directions of requested refill(s):  methylphenidate (CONCERTA) 27 MG CR tablet    Pharmacy to send refill to or patient to pick up rx from office (mark requested pharmacy in BOLD):      [facility] [address], [place] - [address]  [address]  [place] [address]  Phone: [phone] Fax: [phone]        Please mark "X" next to the preferred call back number:    Mobile:   No relevant phone numbers on file.       Home: [phone]   X    Work: [phone]        Patient stated she will need prior authorization for her next refill FYI.     Next visit: Visit date not found

## 2017-06-10 MED ORDER — METHYLPHENIDATE HCL ER (OSM) 27 MG PO TBCR
27.00 mg | EXTENDED_RELEASE_TABLET | Freq: Every day | ORAL | 0 refills | Status: DC
Start: 2017-06-10 — End: 2017-07-10

## 2017-06-10 MED ORDER — METHYLPHENIDATE HCL ER (OSM) 27 MG PO TBCR
27.00 mg | EXTENDED_RELEASE_TABLET | Freq: Every day | ORAL | 0 refills | Status: DC
Start: 2017-06-10 — End: 2017-06-10

## 2017-06-10 NOTE — Telephone Encounter (Signed)
Rx sent to pharmacy   

## 2017-07-10 ENCOUNTER — Other Ambulatory Visit (INDEPENDENT_AMBULATORY_CARE_PROVIDER_SITE_OTHER): Payer: Self-pay | Admitting: Family Medicine

## 2017-07-10 DIAGNOSIS — F900 Attention-deficit hyperactivity disorder, predominantly inattentive type: Secondary | ICD-10-CM

## 2017-07-10 MED ORDER — METHYLPHENIDATE HCL ER (OSM) 27 MG PO TBCR
27.00 mg | EXTENDED_RELEASE_TABLET | Freq: Every day | ORAL | 0 refills | Status: DC
Start: 2017-07-10 — End: 2017-08-03

## 2017-07-10 NOTE — Telephone Encounter (Signed)
Forwarded Rx request to PCP.

## 2017-07-10 NOTE — Telephone Encounter (Signed)
Name, strength, directions of requested refill(s):    methylphenidate (CONCERTA) 27 MG CR tablet    Pharmacy to send refill to or patient to pick up rx from office (mark requested pharmacy in BOLD):      [facility] [address] [place] - [address]  [address]  [place] [address]  Phone: [phone] Fax: [phone]        Please mark "X" next to the preferred call back number:    Mobile:   No relevant phone numbers on file.       Home: [phone]  X   Work: [phone]          Next visit: Visit date not found

## 2017-07-30 ENCOUNTER — Telehealth (INDEPENDENT_AMBULATORY_CARE_PROVIDER_SITE_OTHER): Payer: Self-pay | Admitting: Family Medicine

## 2017-07-30 NOTE — Telephone Encounter (Signed)
Forwarded to Dr. Blanco's nurse.

## 2017-07-30 NOTE — Telephone Encounter (Signed)
Left v/m for Pt to c/b. Dr. Coralee Pesa can squeeze her in tomorrow at 11am.

## 2017-07-30 NOTE — Telephone Encounter (Signed)
Pt is experience a long term stomach bug that she has had since Feb. Feb. 23. Pt has been unable to eat for almost a week now due to the stomach pains. Pt called to schedule an appt with Dr. Coralee Pesa today or tomorrow however Dr. Coralee Pesa is all booked up for today and tomorrow.     Pt would like to see Dr. Coralee Pesa on Monday for a "sick visit" due to her not being available after 3pm on Friday. Pt wanted to see if there was any way to fit her into that Monday schedule.     Pt would like a call back on what to do going further and if she can get in sooner. Pt stated that she is available anytime before 3pm.     Pt contact: [phone]    Thank you!

## 2017-07-30 NOTE — Telephone Encounter (Signed)
Pt coming Monday °

## 2017-07-31 ENCOUNTER — Ambulatory Visit (INDEPENDENT_AMBULATORY_CARE_PROVIDER_SITE_OTHER): Payer: 59 | Admitting: Family Medicine

## 2017-08-03 ENCOUNTER — Other Ambulatory Visit: Payer: 59

## 2017-08-03 ENCOUNTER — Encounter (INDEPENDENT_AMBULATORY_CARE_PROVIDER_SITE_OTHER): Payer: Self-pay | Admitting: Family Medicine

## 2017-08-03 ENCOUNTER — Ambulatory Visit (INDEPENDENT_AMBULATORY_CARE_PROVIDER_SITE_OTHER): Payer: 59 | Admitting: Family Medicine

## 2017-08-03 VITALS — BP 99/65 | HR 81 | Temp 98.1°F | Wt 127.6 lb

## 2017-08-03 DIAGNOSIS — F900 Attention-deficit hyperactivity disorder, predominantly inattentive type: Secondary | ICD-10-CM

## 2017-08-03 DIAGNOSIS — R110 Nausea: Secondary | ICD-10-CM

## 2017-08-03 DIAGNOSIS — R1084 Generalized abdominal pain: Secondary | ICD-10-CM

## 2017-08-03 MED ORDER — METHYLPHENIDATE HCL ER (OSM) 27 MG PO TBCR
27.00 mg | EXTENDED_RELEASE_TABLET | Freq: Every day | ORAL | 0 refills | Status: DC
Start: 2017-08-07 — End: 2017-09-08

## 2017-08-03 MED ORDER — SCOPOLAMINE 1 MG/3DAYS TD PT72
1.00 | MEDICATED_PATCH | TRANSDERMAL | 0 refills | Status: DC
Start: 2017-08-03 — End: 2020-01-18

## 2017-08-03 MED ORDER — ONDANSETRON 4 MG PO TBDP
ORAL_TABLET | ORAL | 0 refills | Status: DC
Start: 2017-08-03 — End: 2020-10-01

## 2017-08-03 NOTE — Progress Notes (Signed)
Subjective:     Patient ID:  Deborah Kirk is a 42 y.o. female.      Here for acute visit due to abdominal pain.  On 07/25/17, had nausea.  Had one episode of vomiting.  Developed right sided abdominal pain.  Lasted for about 6 days.  Pain, cramping.  Had clay colored stool.  The stool initially were pebbles.  In past day, it has become more formed.  Yesterday was the first day she felt she could eat anything.  Had some Zofran ODT she got from telemedicine doctor.    Has known depression.  On Wellbutrin.  Looking for psychiatrist.  Has known ADHD, on Concerta without adverse effects.        Current Outpatient Prescriptions   Medication Sig   . buPROPion (WELLBUTRIN) 100 MG tablet Take 100 mg by mouth 2 (two) times daily.   . methylphenidate (CONCERTA) 27 MG CR tablet Take 1 tablet (27 mg total) by mouth daily.   . Multiple Vitamin (MULTIVITAMIN) capsule Take 1 capsule by mouth daily.   . ondansetron (ZOFRAN-ODT) 4 MG disintegrating tablet 1 TAB ONTO THE TONGUE 4 TIMES PER DAY AS NEEDED FOR NAUSEA AND VOMITING FOR 5 DAY(S)   . eszopiclone (LUNESTA) 1 MG tablet Take 1 tablet (1 mg total) by mouth nightly.Take immediately before bedtime         The following portions of the patient's history were reviewed and updated as appropriate: allergies, current medications, past medical history and problem list.      Review of Systems   Constitutional: Negative for chills, fatigue and fever.   Respiratory: Negative for cough, shortness of breath and wheezing.    Cardiovascular: Negative for chest pain and palpitations.   All other systems reviewed and are negative.      BP 99/65   Pulse 81   Temp 98.1 F (36.7 C) (Oral)   Wt 57.9 kg (127 lb 9.6 oz)   BMI 21.90 kg/m      Objective:   Physical Exam   Constitutional: She appears well-developed and well-nourished. No distress.   Cardiovascular: Normal rate, regular rhythm and normal heart sounds.  Exam reveals no gallop and no friction rub.    No murmur  heard.  Pulmonary/Chest: Effort normal and breath sounds normal. No respiratory distress. She has no wheezes. She has no rales.   Abdominal: Soft. Bowel sounds are normal. She exhibits no distension and no mass. There is no tenderness. There is no rebound and no guarding.   Nursing note and vitals reviewed.         Assessment:       ICD-10-CM    1. Generalized abdominal pain R10.84 CBC and differential     Comprehensive metabolic panel     Lipase     US Abdomen Complete   2. Nausea R11.0 CBC and differential     Comprehensive metabolic panel     Lipase     US Abdomen Complete   3. Attention deficit hyperactivity disorder (ADHD), predominantly inattentive type F90.0 methylphenidate (CONCERTA) 27 MG CR tablet       Plan:     1.2.  Acute, unclear etiology.  Seems to have improved; but labs as above to rule out metabolic cause.  Differential includes cholelithiasis, pancreatitis, viral gastroenteritis.  Follow up pending results.    3.  Chronic, continue with Concerta.  Recommend cognitive skill development.  [id number].  Dx: F90.0      Risk & benefits of any medication(s)  given/prescribed were explained to the patient (and/or family) who verbalized understanding & agreed to the treatment plan.  Patient (and/or family) encouraged to contact me/clinical staff with any questions/concerns.      Procedures  :none

## 2017-08-04 LAB — COMPREHENSIVE METABOLIC PANEL
ALT: 44 IU/L — ABNORMAL HIGH (ref 0–32)
AST (SGOT): 36 IU/L (ref 0–40)
Albumin/Globulin Ratio: 1.4 (ref 1.2–2.2)
Albumin: 4.3 g/dL (ref 3.5–5.5)
Alkaline Phosphatase: 56 IU/L (ref 39–117)
BUN / Creatinine Ratio: 24 — ABNORMAL HIGH (ref 9–23)
BUN: 16 mg/dL (ref 6–24)
Bilirubin, Total: 0.2 mg/dL (ref 0.0–1.2)
CO2: 24 mmol/L (ref 20–29)
Calcium: 9.6 mg/dL (ref 8.7–10.2)
Chloride: 103 mmol/L (ref 96–106)
Creatinine: 0.67 mg/dL (ref 0.57–1.00)
EGFR: 108 mL/min/{1.73_m2} (ref >59)
EGFR: 125 mL/min/{1.73_m2} (ref >59)
Globulin, Total: 3.1 g/dL (ref 1.5–4.5)
Glucose: 90 mg/dL (ref 65–99)
Potassium: 4.3 mmol/L (ref 3.5–5.2)
Protein, Total: 7.4 g/dL (ref 6.0–8.5)
Sodium: 141 mmol/L (ref 134–144)

## 2017-08-04 LAB — CBC AND DIFFERENTIAL
Baso(Absolute): 0 10*3/uL (ref 0.0–0.2)
Basos: 1 %
Eos: 2 %
Eosinophils Absolute: 0.1 10*3/uL (ref 0.0–0.4)
Hematocrit: 36.4 % (ref 34.0–46.6)
Hemoglobin: 12.5 g/dL (ref 11.1–15.9)
Immature Granulocytes Absolute: 0 10*3/uL (ref 0.0–0.1)
Immature Granulocytes: 0 %
Lymphocytes Absolute: 1.7 10*3/uL (ref 0.7–3.1)
Lymphocytes: 26 %
MCH: 29.3 pg (ref 26.6–33.0)
MCHC: 34.3 g/dL (ref 31.5–35.7)
MCV: 85 fL (ref 79–97)
Monocytes Absolute: 0.4 10*3/uL (ref 0.1–0.9)
Monocytes: 6 %
Neutrophils Absolute: 4.4 10*3/uL (ref 1.4–7.0)
Neutrophils: 65 %
Platelets: 510 10*3/uL — ABNORMAL HIGH (ref 150–379)
RBC: 4.27 x10E6/uL (ref 3.77–5.28)
RDW: 13.1 % (ref 12.3–15.4)
WBC: 6.6 10*3/uL (ref 3.4–10.8)

## 2017-08-04 LAB — LIPASE: Lipase: 27 U/L (ref 14–72)

## 2017-08-13 ENCOUNTER — Other Ambulatory Visit: Payer: Self-pay | Admitting: Family Medicine

## 2017-09-08 ENCOUNTER — Other Ambulatory Visit (INDEPENDENT_AMBULATORY_CARE_PROVIDER_SITE_OTHER): Payer: Self-pay | Admitting: Family Medicine

## 2017-09-08 DIAGNOSIS — F900 Attention-deficit hyperactivity disorder, predominantly inattentive type: Secondary | ICD-10-CM

## 2017-09-08 MED ORDER — METHYLPHENIDATE HCL ER (OSM) 27 MG PO TBCR
27.00 mg | EXTENDED_RELEASE_TABLET | Freq: Every day | ORAL | 0 refills | Status: DC
Start: 2017-09-08 — End: 2017-10-02

## 2017-09-08 NOTE — Telephone Encounter (Signed)
Forwarded to one of the covering providers.

## 2017-09-08 NOTE — Telephone Encounter (Signed)
Name, strength, directions of requested refill(s):    methylphenidate (CONCERTA) 27 MG CR tablet (Order [id number])     **Pt is completely out of the above medication**     Pharmacy to send refill to or patient to pick up rx from office (mark requested pharmacy in BOLD):      [facility] [address] [place] - [address] [facility]  [address]  [place] [address]  Phone: [phone] Fax: [phone]        Please mark "X" next to the preferred call back number:    Mobile:   No relevant phone numbers on file.       Home: [phone] X   Work: [phone]          Next visit: Visit date not found

## 2017-10-02 ENCOUNTER — Other Ambulatory Visit (INDEPENDENT_AMBULATORY_CARE_PROVIDER_SITE_OTHER): Payer: Self-pay | Admitting: Family Medicine

## 2017-10-02 DIAGNOSIS — F900 Attention-deficit hyperactivity disorder, predominantly inattentive type: Secondary | ICD-10-CM

## 2017-10-02 MED ORDER — METHYLPHENIDATE HCL ER (OSM) 27 MG PO TBCR
27.00 mg | EXTENDED_RELEASE_TABLET | Freq: Every day | ORAL | 0 refills | Status: DC
Start: 2017-10-02 — End: 2017-10-30

## 2017-10-02 NOTE — Telephone Encounter (Signed)
Name, strength, directions of requested refill(s):    methylphenidate (CONCERTA) 27 MG CR tablet (Order [id number])       Pharmacy to send refill to or patient to pick up rx from office (mark requested pharmacy in BOLD):      [facility] [address], [place] - [address] [facility]  [address]  [place] [address]  Phone: [phone] Fax: [phone]        Please mark "X" next to the preferred call back number:    Mobile:   No relevant phone numbers on file.       Home: [phone] x   Work: [phone]          Next visit: Visit date not found

## 2017-10-02 NOTE — Telephone Encounter (Signed)
Patient's labs reassuring.  Platelet count was increased - this can happen when you are feeling ill.  The ultrasound was normal.  Have symptoms resolved at this point?

## 2017-10-02 NOTE — Telephone Encounter (Signed)
Please advise on pt's [place] and labs from 07/2017.    Thanks!

## 2017-10-27 ENCOUNTER — Ambulatory Visit: Payer: Self-pay

## 2017-10-27 ENCOUNTER — Encounter (FREE_STANDING_LABORATORY_FACILITY): Payer: 59

## 2017-10-27 DIAGNOSIS — R194 Change in bowel habit: Secondary | ICD-10-CM

## 2017-10-27 DIAGNOSIS — Z1211 Encounter for screening for malignant neoplasm of colon: Secondary | ICD-10-CM

## 2017-10-30 ENCOUNTER — Other Ambulatory Visit (INDEPENDENT_AMBULATORY_CARE_PROVIDER_SITE_OTHER): Payer: Self-pay | Admitting: Family Medicine

## 2017-10-30 DIAGNOSIS — F900 Attention-deficit hyperactivity disorder, predominantly inattentive type: Secondary | ICD-10-CM

## 2017-10-30 MED ORDER — METHYLPHENIDATE HCL ER (OSM) 27 MG PO TBCR
27.00 mg | EXTENDED_RELEASE_TABLET | Freq: Every day | ORAL | 0 refills | Status: DC
Start: 2017-10-30 — End: 2017-11-25

## 2017-10-30 NOTE — Telephone Encounter (Signed)
Name, strength, directions of requested refill(s):    methylphenidate (CONCERTA) 27 MG CR tablet    Pharmacy to send refill to or patient to pick up rx from office (mark requested pharmacy in BOLD):      [id number] - [place], [place] - [address]  [address]  [place]  [place] [address]  Phone: [phone] Fax: [phone]        Please mark "X" next to the preferred call back number:    Mobile:   No relevant phone numbers on file.       Home: [phone] x   Work: [phone]          Next visit: Visit date not found

## 2017-11-25 ENCOUNTER — Other Ambulatory Visit (INDEPENDENT_AMBULATORY_CARE_PROVIDER_SITE_OTHER): Payer: Self-pay

## 2017-11-25 DIAGNOSIS — F900 Attention-deficit hyperactivity disorder, predominantly inattentive type: Secondary | ICD-10-CM

## 2017-11-25 MED ORDER — METHYLPHENIDATE HCL ER (OSM) 27 MG PO TBCR
27.00 mg | EXTENDED_RELEASE_TABLET | Freq: Every day | ORAL | 0 refills | Status: DC
Start: 2017-11-25 — End: 2017-12-17

## 2017-11-25 NOTE — Telephone Encounter (Signed)
Per last OV note Pt was to return in 1 month. Please advise.

## 2017-11-25 NOTE — Telephone Encounter (Signed)
Per Dr. Coralee Pesa OK to come every 6 months. Pt should schedule in 3 months. Please send Rx to [facility].

## 2017-11-26 ENCOUNTER — Encounter (INDEPENDENT_AMBULATORY_CARE_PROVIDER_SITE_OTHER): Payer: Self-pay | Admitting: Family Medicine

## 2017-11-26 ENCOUNTER — Telehealth (INDEPENDENT_AMBULATORY_CARE_PROVIDER_SITE_OTHER): Payer: Self-pay | Admitting: Family Medicine

## 2017-11-26 NOTE — Telephone Encounter (Signed)
I sent eRx yesterday 11/25/2017 to [facility].  Perhaps a call to pharmacy is needed to have them fill it early before she leaves.

## 2017-11-26 NOTE — Telephone Encounter (Signed)
Pharmacy advised that per Dr. Coralee Pesa, it was ok to fill early.

## 2017-11-26 NOTE — Telephone Encounter (Signed)
Please advise if this is ok.  

## 2017-11-26 NOTE — Telephone Encounter (Signed)
Pt's pharmacy called to get approve to refill the following medication early for the pt due to the pt going out of town on 11/30/17.    methylphenidate (CONCERTA) 27 MG CR tablet (Order [id number])     Pt last filled medication on 11/03/17 and would like to fill the medication on 11/29/17 due to going out of town     Please contact pharmacy if this is okay for them to refill for her this soon.     Pharmacy can be reached at: [phone]

## 2017-11-30 ENCOUNTER — Encounter (INDEPENDENT_AMBULATORY_CARE_PROVIDER_SITE_OTHER): Payer: Self-pay

## 2017-12-08 ENCOUNTER — Encounter (INDEPENDENT_AMBULATORY_CARE_PROVIDER_SITE_OTHER): Payer: Self-pay

## 2017-12-17 ENCOUNTER — Encounter (INDEPENDENT_AMBULATORY_CARE_PROVIDER_SITE_OTHER): Payer: Self-pay | Admitting: Family Medicine

## 2017-12-17 ENCOUNTER — Ambulatory Visit (INDEPENDENT_AMBULATORY_CARE_PROVIDER_SITE_OTHER): Payer: No Typology Code available for payment source | Admitting: Family Medicine

## 2017-12-17 VITALS — BP 92/66 | HR 74 | Temp 97.9°F | Resp 16 | Wt 132.4 lb

## 2017-12-17 DIAGNOSIS — M26629 Arthralgia of temporomandibular joint, unspecified side: Secondary | ICD-10-CM

## 2017-12-17 DIAGNOSIS — F418 Other specified anxiety disorders: Secondary | ICD-10-CM

## 2017-12-17 DIAGNOSIS — F900 Attention-deficit hyperactivity disorder, predominantly inattentive type: Secondary | ICD-10-CM

## 2017-12-17 MED ORDER — CYCLOBENZAPRINE HCL 10 MG PO TABS
5.00 mg | ORAL_TABLET | Freq: Three times a day (TID) | ORAL | 0 refills | Status: AC | PRN
Start: 2017-12-17 — End: 2018-01-16

## 2017-12-17 MED ORDER — METHYLPHENIDATE HCL ER (OSM) 27 MG PO TBCR
27.00 mg | EXTENDED_RELEASE_TABLET | Freq: Every day | ORAL | 0 refills | Status: DC
Start: 2017-12-22 — End: 2018-01-19

## 2017-12-17 MED ORDER — ESCITALOPRAM OXALATE 5 MG PO TABS
5.00 mg | ORAL_TABLET | Freq: Every day | ORAL | 0 refills | Status: DC
Start: 2017-12-17 — End: 2019-05-20

## 2017-12-17 NOTE — Progress Notes (Signed)
Subjective:     Patient ID:  Deborah Kirk is a 42 y.o. female.      42 yo here today for follow up of ADHD and anxiety.  Has been on Concerta 27mg  daily.  Doing well with this dose.  Denies any appetite issues.  She feels she has reached a plateau with Wellbutrin.  Asks about alternatives.  Her mother has had success with Lexapro.  Denies headache, nausea, vomiting, change in appetite.  Has history of TMJ syndrome of left mainly.  Sees dentist for this who treats with TENS.  Patient reports tightness, especially at work.        Current Outpatient Prescriptions   Medication Sig   . eszopiclone (LUNESTA) 1 MG tablet Take 1 tablet (1 mg total) by mouth nightly.Take immediately before bedtime   . fluticasone furoate-vilanterol (BREO ELLIPTA) 100-25 MCG/INH Aerosol Powder, Breath Activtivatede Inhale 1 puff into the lungs daily   . [START ON 12/22/2017] methylphenidate (CONCERTA) 27 MG CR tablet Take 1 tablet (27 mg total) by mouth daily   . Multiple Vitamin (MULTIVITAMIN) capsule Take 1 capsule by mouth daily.   . ondansetron (ZOFRAN-ODT) 4 MG disintegrating tablet 1 TAB ONTO THE TONGUE 4 TIMES PER DAY AS NEEDED FOR NAUSEA AND VOMITING FOR 5 DAY(S)   . scopolamine (TRANSDERM-SCOP) 1.5 mg Place 1 patch onto the skin every third day.   . cyclobenzaprine (FLEXERIL) 10 MG tablet Take 0.5-1 tablets (5-10 mg total) by mouth every 8 (eight) hours as needed for Muscle spasms   . escitalopram (LEXAPRO) 5 MG tablet Take 1 tablet (5 mg total) by mouth daily         The following portions of the patient's history were reviewed and updated as appropriate: allergies, current medications, past medical history and problem list.      Review of Systems   Constitutional: Negative for chills, fatigue and fever.   Respiratory: Negative for cough, shortness of breath and wheezing.    Cardiovascular: Negative for chest pain and palpitations.   All other systems reviewed and are negative.      BP 92/66 (BP Site: Left arm, Patient  Position: Sitting)   Pulse 74   Temp 97.9 F (36.6 C) (Oral)   Resp 16   Wt 60.1 kg (132 lb 6.4 oz)   LMP 11/30/2017 (Approximate)   BMI 22.73 kg/m      Objective:   Physical Exam   Constitutional: She appears well-developed and well-nourished. No distress.   Cardiovascular: Normal rate, regular rhythm and normal heart sounds.  Exam reveals no gallop and no friction rub.    No murmur heard.  Pulmonary/Chest: Effort normal and breath sounds normal. No respiratory distress. She has no wheezes. She has no rales.   Nursing note and vitals reviewed.         Assessment:       ICD-10-CM    1. Attention deficit hyperactivity disorder (ADHD), predominantly inattentive type F90.0 methylphenidate (CONCERTA) 27 MG CR tablet   2. 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 Arthralgia of temporomandibular joint, unspecified laterality M26.629        Plan:     1.  Chronic, continue with Concerta at 27mg  daily.  Follow up in 3 months.    2.  Chronic, no great improvement with Wellbutrin.  Switch to Lexapro 5mg  daily.  Consider titration in 3 weeks.    3.  Chronic, trial of Flexeril.      Risk & benefits of any medication(s)  given/prescribed were explained to the patient (and/or family) who verbalized understanding & agreed to the treatment plan.  Patient (and/or family) encouraged to contact me/clinical staff with any questions/concerns.      Procedures  :none

## 2018-01-08 ENCOUNTER — Encounter (INDEPENDENT_AMBULATORY_CARE_PROVIDER_SITE_OTHER): Payer: Self-pay

## 2018-01-19 ENCOUNTER — Other Ambulatory Visit (INDEPENDENT_AMBULATORY_CARE_PROVIDER_SITE_OTHER): Payer: Self-pay | Admitting: Family Medicine

## 2018-01-19 DIAGNOSIS — F900 Attention-deficit hyperactivity disorder, predominantly inattentive type: Secondary | ICD-10-CM

## 2018-01-19 MED ORDER — METHYLPHENIDATE HCL ER (OSM) 27 MG PO TBCR
27.00 mg | EXTENDED_RELEASE_TABLET | Freq: Every day | ORAL | 0 refills | Status: DC
Start: 2018-01-19 — End: 2018-02-15

## 2018-01-19 NOTE — Telephone Encounter (Signed)
Name, strength, directions of requested refill(s):    methylphenidate (CONCERTA) 27 MG CR tablet    Pharmacy to send refill to or patient to pick up rx from office (mark requested pharmacy in BOLD):      [facility] [address] [place] - [address]  [address]  [place] [address]  Phone: [phone] Fax: [phone]        Please mark "X" next to the preferred call back number:    Mobile:   No relevant phone numbers on file.       Home: [phone] x   Work: [phone]          Next visit: Visit date not found

## 2018-01-19 NOTE — Telephone Encounter (Signed)
Forwarded to Shanley

## 2018-01-19 NOTE — Telephone Encounter (Signed)
Forwarded to PCP.

## 2018-02-08 ENCOUNTER — Encounter (INDEPENDENT_AMBULATORY_CARE_PROVIDER_SITE_OTHER): Payer: Self-pay

## 2018-02-15 ENCOUNTER — Other Ambulatory Visit (INDEPENDENT_AMBULATORY_CARE_PROVIDER_SITE_OTHER): Payer: Self-pay | Admitting: Family Medicine

## 2018-02-15 DIAGNOSIS — F900 Attention-deficit hyperactivity disorder, predominantly inattentive type: Secondary | ICD-10-CM

## 2018-02-15 NOTE — Telephone Encounter (Signed)
Name, strength, directions of requested refill(s):  methylphenidate (CONCERTA) 27 MG CR tablet    Pharmacy to send refill to or patient to pick up rx from office (mark requested pharmacy in BOLD):      [facility] [address] [place] - [address]  [address]  [place] [address]  Phone: [phone] Fax: [phone]        Please mark "X" next to the preferred call back number:    Mobile:   No relevant phone numbers on file.       Home: [phone]    Work: [phone]          Next visit: Visit date not found

## 2018-02-15 NOTE — Telephone Encounter (Signed)
Forwarded to PCP.

## 2018-02-16 MED ORDER — METHYLPHENIDATE HCL ER (OSM) 27 MG PO TBCR
27.00 mg | EXTENDED_RELEASE_TABLET | Freq: Every day | ORAL | 0 refills | Status: DC
Start: 2018-02-16 — End: 2018-03-05

## 2018-03-04 ENCOUNTER — Encounter (INDEPENDENT_AMBULATORY_CARE_PROVIDER_SITE_OTHER): Payer: Self-pay | Admitting: Family Medicine

## 2018-03-04 DIAGNOSIS — F900 Attention-deficit hyperactivity disorder, predominantly inattentive type: Secondary | ICD-10-CM

## 2018-03-05 MED ORDER — METHYLPHENIDATE HCL ER (OSM) 27 MG PO TBCR
27.00 mg | EXTENDED_RELEASE_TABLET | Freq: Every day | ORAL | 0 refills | Status: DC
Start: 2018-03-05 — End: 2018-03-08

## 2018-03-08 ENCOUNTER — Telehealth (INDEPENDENT_AMBULATORY_CARE_PROVIDER_SITE_OTHER): Payer: Self-pay | Admitting: Family Medicine

## 2018-03-08 DIAGNOSIS — F900 Attention-deficit hyperactivity disorder, predominantly inattentive type: Secondary | ICD-10-CM

## 2018-03-08 MED ORDER — METHYLPHENIDATE HCL ER (OSM) 27 MG PO TBCR
27.00 mg | EXTENDED_RELEASE_TABLET | Freq: Every day | ORAL | 0 refills | Status: AC
Start: 2018-03-08 — End: 2019-03-08

## 2018-03-08 NOTE — Telephone Encounter (Signed)
Please re-send Concerta to [facility].

## 2018-03-08 NOTE — Telephone Encounter (Signed)
Preferred pharmacy :  [facility] [address] [place] - [address]  [address]  [place] [address]  Phone: [phone] Fax: [phone]

## 2018-03-08 NOTE — Telephone Encounter (Signed)
Patient is requesting concerta 27MG  by Linwood Dibbles. Patient stated concerta by patriot made her jumpy and agitated. Patient is requesting if script could be written for 90 supply after 3 months. See encounter from 10/03.    Please callback [phone] or contact through MyChart

## 2018-03-23 ENCOUNTER — Encounter (INDEPENDENT_AMBULATORY_CARE_PROVIDER_SITE_OTHER): Payer: Self-pay

## 2018-04-14 ENCOUNTER — Encounter (INDEPENDENT_AMBULATORY_CARE_PROVIDER_SITE_OTHER): Payer: Self-pay

## 2018-08-09 ENCOUNTER — Encounter (HOSPITAL_COMMUNITY): Payer: Self-pay | Admitting: *Deleted

## 2018-08-09 ENCOUNTER — Other Ambulatory Visit: Payer: Self-pay

## 2018-08-09 ENCOUNTER — Inpatient Hospital Stay (HOSPITAL_COMMUNITY)
Admission: AD | Admit: 2018-08-09 | Discharge: 2018-08-09 | Disposition: A | Discharge status: Home / Self Care | Payer: Self-pay | Attending: Emergency Medicine | Admitting: Emergency Medicine

## 2018-08-09 DIAGNOSIS — R102 Pelvic and perineal pain: Secondary | ICD-10-CM | POA: Insufficient documentation

## 2018-08-09 DIAGNOSIS — Z885 Allergy status to narcotic agent status: Secondary | ICD-10-CM | POA: Insufficient documentation

## 2018-08-09 DIAGNOSIS — N898 Other specified noninflammatory disorders of vagina: Secondary | ICD-10-CM | POA: Insufficient documentation

## 2018-08-09 DIAGNOSIS — Z87891 Personal history of nicotine dependence: Secondary | ICD-10-CM | POA: Insufficient documentation

## 2018-08-09 DIAGNOSIS — E739 Lactose intolerance, unspecified: Secondary | ICD-10-CM | POA: Insufficient documentation

## 2018-08-09 DIAGNOSIS — Z79899 Other long term (current) drug therapy: Secondary | ICD-10-CM | POA: Insufficient documentation

## 2018-08-09 DIAGNOSIS — Z809 Family history of malignant neoplasm, unspecified: Secondary | ICD-10-CM | POA: Insufficient documentation

## 2018-08-09 MED ORDER — TRIAMCINOLONE ACETONIDE 0.5 % EX OINT: 1.0000 "application " | TOPICAL_OINTMENT | Freq: Two times a day (BID) | CUTANEOUS | 4 refills | Status: DC

## 2018-08-09 NOTE — Discharge Instructions (Addendum)
If you experience any new or worsening signs or symptoms please follow-up with the women's clinic.  Please follow-up with your primary care provider or specialist as discussed. Please use medication prescribed only as directed and discontinue taking if you have any concerning signs or symptoms.

## 2018-08-09 NOTE — ED Provider Notes (Signed)
[facility] EMERGENCY DEPARTMENT Provider Note   CSN: [id number] Arrival date & time: 08/09/18  0746    History   Chief Complaint Chief Complaint  Patient presents with  . Vaginal Pain    HPI Crystal Hernandez is a 43 y.o. female.     HPI   43 year old female presents today with complaints of vaginal irritation.  Patient notes that she developed pain within the labial folds.  She notes a similar presentation approximately 2 years ago for which she was given a cream which improved her symptoms.  She denies any vaginal discharge, and denies abdominal pain fever, dysuria, malodorous urine, or change in color.  She notes 3 days of bilateral low back pain with no radiation of symptoms.  No trauma.  No distal neurological deficits.  She reports she was seen at the MAU and was discharged as she was not pregnant.  She reports she does not have transportation so she came to the emergency room.  Past Medical History:  Diagnosis Date  . Abnormal Pap smear   . Depression   . 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 Infection    UTI  . Kidney abscess   . Seasonal allergies   . Vaginal Pap smear, abnormal     There are no active problems to display for this patient.   Past Surgical History:  Procedure Laterality Date  . ANTERIOR CRUCIATE LIGAMENT REPAIR     rt  . COLPOSCOPY    . KNEE SURGERY    . TUBAL LIGATION    . urethral stint    . WISDOM TOOTH EXTRACTION       OB History    Gravida  3   Para  3   Term  3   Preterm      AB      Living  3     SAB      TAB      Ectopic      Multiple      Live Births               Home Medications    Prior to Admission medications   Medication Sig Start Date End Date Taking? Authorizing Provider  acetaminophen (TYLENOL) 500 MG tablet Take 1,000 mg by mouth every 6 (six) hours as needed. Cramps    [provider]  acidophilus (RISAQUAD) CAPS capsule Take 1  capsule by mouth daily.    [provider]  amoxicillin (AMOXIL) 500 MG capsule Take 1 capsule (500 mg total) by mouth 3 (three) times daily. 05/31/17   Raeford Razor, MD  Clobetasol Prop Emollient Base 0.05 % emollient cream Apply to affected area at bedtime daily. 04/13/17   Burleson, Brand Males, NP  triamcinolone ointment (KENALOG) 0.5 % Apply 1 application topically 2 (two) times daily. 08/09/18   Eyvonne Mechanic, PA-C    Family History Family History  Problem Relation Age of Onset  . Cancer Father        89  . Anesthesia problems Neg Hx   . Hypotension Neg Hx   . Malignant hyperthermia Neg Hx   . Pseudochol deficiency Neg Hx     Social History Social History   Tobacco Use  . Smoking status: Former Smoker    Packs/day: 0.50    Years: 26.00    Pack years: 13.00    Types: Cigarettes    Last attempt to quit: 06/07/2015  Years since quitting: 3.1  . Smokeless tobacco: Never Used  Substance Use Topics  . Alcohol use: Yes    Comment: occasional   . Drug use: No     Allergies   Morphine and related and Lactose intolerance (gi)   Review of Systems Review of Systems  All other systems reviewed and are negative.    Physical Exam Updated Vital Signs BP (!) 154/97 (BP Location: Right Arm)   Pulse 74   Temp 98.2 F (36.8 C) (Oral)   Resp 18   Wt 88.5 kg   LMP 07/30/2018   SpO2 98%   BMI 33.47 kg/m   Physical Exam Vitals signs and nursing note reviewed.  Constitutional:      Appearance: She is well-developed.  HENT:     Head: Normocephalic and atraumatic.  Eyes:     General: No scleral icterus.       Right eye: No discharge.        Left eye: No discharge.     Conjunctiva/sclera: Conjunctivae normal.     Pupils: Pupils are equal, round, and reactive to light.  Neck:     Musculoskeletal: Normal range of motion.     Vascular: No JVD.     Trachea: No tracheal deviation.  Pulmonary:     Effort: Pulmonary effort is normal.     Breath sounds: No  stridor.  Genitourinary:    Comments: Fissure noted along the superior labial fold, no surrounding erythema, no discharge, no bleeding Neurological:     Mental Status: She is alert and oriented to person, place, and time.     Coordination: Coordination normal.  Psychiatric:        Behavior: Behavior normal.        Thought Content: Thought content normal.        Judgment: Judgment normal.      ED Treatments / Results  Labs (all labs ordered are listed, but only abnormal results are displayed) Labs Reviewed - No data to display  EKG None  Radiology No results found.  Procedures Procedures (including critical care time)  Medications Ordered in ED Medications - No data to display   Initial Impression / Assessment and Plan / ED Course  I have reviewed the triage vital signs and the nursing notes.  Pertinent labs & imaging results that were available during my care of the patient were reviewed by me and considered in my medical decision making (see chart for details).        43 year old female presents today with vaginal irritation.  Patient does have cracking to the skin, no signs of surrounding infection.  She notes previous cream has improved her symptoms.  Patient was prescribed triamcinolone in the past.  She be discharged with a prescription for this and encouraged to use the medication as needed.  Patient has persistence of symptoms she does have information for outpatient women's clinic follow-up.  She given strict return precautions, she verbalized understanding and agreement to today's plan had no further questions or concerns.  Final Clinical Impressions(s) / ED Diagnoses   Final diagnoses:  Vaginal pain  Vaginal irritation    ED Discharge Orders         Ordered    Discharge patient     08/09/18 0813    triamcinolone ointment (KENALOG) 0.5 %  2 times daily     08/09/18 0933           Eyvonne Mechanic, PA-C 08/09/18 1610    Raeford Razor,  MD 08/09/18 1026

## 2018-08-09 NOTE — MAU Note (Signed)
Is not pregnant.  Noted yesterday, that up by her clitoris, the skin is broken, it itches, is painful.  The whole area is broken out and tender.  Is also having low back pain, thinks it might be a kidney infection. Having frequency and urgency, some discomfort with urination.

## 2018-08-09 NOTE — ED Triage Notes (Signed)
Pt reports having vaginal irritation and lower back pain.

## 2018-08-09 NOTE — MAU Provider Note (Signed)
First Provider Initiated Contact with Patient 08/09/18 [phone]      S Ms. Crystal Hernandez is a 43 y.o. [phone] non-pregnant female who presents to MAU today with complaint of vaginal pain.   O BP 137/87 (BP Location: Right Arm)   Pulse 69   Temp 98.5 F (36.9 C) (Oral)   Resp 18   Wt 88.5 kg   LMP 07/30/2018   SpO2 100%   BMI 33.47 kg/m  Physical Exam  Constitutional: She is oriented to person, place, and time. She appears well-developed and well-nourished.  HENT:  Head: Normocephalic.  Neck: Normal range of motion.  Cardiovascular: Normal rate.  Respiratory: Effort normal.  Musculoskeletal: Normal range of motion.  Neurological: She is alert and oriented to person, place, and time.  Psychiatric: She has a normal mood and affect.    A Non pregnant female Medical screening exam complete Vaginal pain  P Discharge from MAU in stable condition Patient given the option of transfer to [facility] for further evaluation or seek care in outpatient facility of choice List of options for follow-up given  Warning signs for worsening condition that would warrant emergency follow-up discussed Patient may return to MAU as needed for pregnancy related complaints  Donette Larry, CNM 08/09/2018 8:11 AM

## 2018-08-09 NOTE — ED Notes (Signed)
ED Provider at bedside. 

## 2018-10-13 ENCOUNTER — Encounter (INDEPENDENT_AMBULATORY_CARE_PROVIDER_SITE_OTHER): Payer: Self-pay | Admitting: Family Medicine

## 2018-10-14 ENCOUNTER — Encounter (INDEPENDENT_AMBULATORY_CARE_PROVIDER_SITE_OTHER): Payer: Self-pay | Admitting: Family Medicine

## 2018-10-14 NOTE — Telephone Encounter (Signed)
Duplicate

## 2019-01-30 ENCOUNTER — Other Ambulatory Visit: Payer: Self-pay

## 2019-01-30 ENCOUNTER — Encounter (HOSPITAL_COMMUNITY): Payer: Self-pay | Admitting: Emergency Medicine

## 2019-01-30 ENCOUNTER — Emergency Department (HOSPITAL_COMMUNITY)
Admission: EM | Admit: 2019-01-30 | Discharge: 2019-01-30 | Disposition: A | Discharge status: Home / Self Care | Payer: Self-pay | Attending: Emergency Medicine | Admitting: Emergency Medicine

## 2019-01-30 DIAGNOSIS — R102 Pelvic and perineal pain: Secondary | ICD-10-CM | POA: Insufficient documentation

## 2019-01-30 DIAGNOSIS — R1032 Left lower quadrant pain: Secondary | ICD-10-CM | POA: Insufficient documentation

## 2019-01-30 DIAGNOSIS — Z87891 Personal history of nicotine dependence: Secondary | ICD-10-CM | POA: Insufficient documentation

## 2019-01-30 DIAGNOSIS — R1031 Right lower quadrant pain: Secondary | ICD-10-CM | POA: Insufficient documentation

## 2019-01-30 DIAGNOSIS — Z79899 Other long term (current) drug therapy: Secondary | ICD-10-CM | POA: Insufficient documentation

## 2019-01-30 LAB — WET PREP, GENITAL
Sperm: NONE SEEN
Trich, Wet Prep: NONE SEEN
Yeast Wet Prep HPF POC: NONE SEEN

## 2019-01-30 LAB — HCG, QUANTITATIVE, PREGNANCY: hCG, Beta Chain, Quant, S: <1 m[IU]/mL (ref <5)

## 2019-01-30 MED ORDER — CEFTRIAXONE SODIUM 250 MG IJ SOLR
250.0000 mg | Freq: Once | INTRAMUSCULAR | Status: AC
  Administered 2019-01-30: 20:00:00 250 mg via INTRAMUSCULAR
  Filled 2019-01-30: qty 250

## 2019-01-30 MED ORDER — HYDROMORPHONE HCL 1 MG/ML IJ SOLN
1.0000 mg | Freq: Once | INTRAMUSCULAR | Status: AC
  Administered 2019-01-30: 20:00:00 1 mg via INTRAVENOUS
  Filled 2019-01-30: qty 1

## 2019-01-30 MED ORDER — OXYCODONE-ACETAMINOPHEN 5-325 MG PO TABS: 1.0000 | ORAL_TABLET | ORAL | 0 refills | Status: DC | PRN

## 2019-01-30 MED ORDER — STERILE WATER FOR INJECTION IJ SOLN
INTRAMUSCULAR | Status: AC
  Administered 2019-01-30: 20:00:00 0.9 mL
  Filled 2019-01-30: qty 10

## 2019-01-30 MED ORDER — OXYCODONE-ACETAMINOPHEN 5-325 MG PO TABS
2.0000 | ORAL_TABLET | Freq: Once | ORAL | Status: AC
  Administered 2019-01-30: 16:00:00 2 via ORAL
  Filled 2019-01-30: qty 2

## 2019-01-30 MED ORDER — ONDANSETRON HCL 4 MG/2ML IJ SOLN
4.0000 mg | Freq: Once | INTRAMUSCULAR | Status: AC
  Administered 2019-01-30: 20:00:00 4 mg via INTRAVENOUS
  Filled 2019-01-30: qty 2

## 2019-01-30 MED ORDER — AZITHROMYCIN 1 G PO PACK
1.0000 g | PACK | Freq: Once | ORAL | Status: AC
  Administered 2019-01-30: 1 g via ORAL
  Filled 2019-01-30: qty 1

## 2019-01-30 NOTE — Discharge Instructions (Addendum)
You are being treated for possible STD.  We sent a prescription to your pharmacy, for pain relief.  Recheck, with the doctor of your choice, if not better in 3 to 4 days

## 2019-01-30 NOTE — ED Triage Notes (Signed)
Per pt, states she has had 2 periods this month-started having lower pelvic pain while at work today-states she has been separated from husband although having sex with him-wants to be checked for STDs

## 2019-01-30 NOTE — ED Provider Notes (Signed)
[facility]-EMERGENCY DEPT Provider Note   CSN: [id number] Arrival date & time: 01/30/19  1407     History   Chief Complaint Chief Complaint  Patient presents with  . Pelvic Pain    HPI Susa RaringKimberly T Garr is a 43 y.o. female.     HPI  She presents for evaluation of pelvic pain, radiating to both right and left lower quadrant of the abdomen, yesterday.  Irregular menses recently with menstrual flow, July 29 and August 21, 5 and 7 days respectively.  She is concerned that she has an STD.  She is separated from her husband.  She denies fever, chills, nausea, vomiting, cough, shortness of breath, weakness or dizziness.  There are no other known modifying factors.   Past Medical History:  Diagnosis Date  . Abnormal Pap smear   . Depression   . 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 Infection    UTI  . Kidney abscess   . Seasonal allergies   . Vaginal Pap smear, abnormal     There are no active problems to display for this patient.   Past Surgical History:  Procedure Laterality Date  . ANTERIOR CRUCIATE LIGAMENT REPAIR     rt  . COLPOSCOPY    . KNEE SURGERY    . TUBAL LIGATION    . urethral stint    . WISDOM TOOTH EXTRACTION       OB History    Gravida  3   Para  3   Term  3   Preterm      AB      Living  3     SAB      TAB      Ectopic      Multiple      Live Births               Home Medications    Prior to Admission medications   Medication Sig Start Date End Date Taking? Authorizing Provider  acidophilus (RISAQUAD) CAPS capsule Take 1 capsule by mouth daily.   Yes [provider]  Multiple Vitamin (MULTIVITAMIN) tablet Take 1 tablet by mouth daily.   Yes [provider]  vitamin C (ASCORBIC ACID) 500 MG tablet Take 500 mg by mouth daily.   Yes [provider]  acetaminophen (TYLENOL) 500 MG tablet Take 1,000 mg by mouth every 6 (six) hours as needed for  moderate pain or headache.     [provider]  amoxicillin (AMOXIL) 500 MG capsule Take 1 capsule (500 mg total) by mouth 3 (three) times daily. Patient not taking: Reported on 01/30/2019 05/31/17   Raeford RazorKohut, Stephen, MD  Clobetasol Prop Emollient Base 0.05 % emollient cream Apply to affected area at bedtime daily. Patient not taking: Reported on 01/30/2019 04/13/17   Currie ParisBurleson, Terri L, NP  oxyCODONE-acetaminophen (PERCOCET/ROXICET) 5-325 MG tablet Take 1 tablet by mouth every 4 (four) hours as needed for severe pain. 01/30/19   Mancel BaleWentz, Avyana Puffenbarger, MD  triamcinolone ointment (KENALOG) 0.5 % Apply 1 application topically 2 (two) times daily. Patient not taking: Reported on 01/30/2019 08/09/18   Eyvonne MechanicHedges, Jeffrey, PA-C    Family History Family History  Problem Relation Age of Onset  . Cancer Father        [id number]  . Anesthesia problems Neg Hx   . Hypotension Neg Hx   . Malignant hyperthermia Neg Hx   . Pseudochol deficiency Neg Hx  Social History Social History   Tobacco Use  . Smoking status: Former Smoker    Packs/day: 0.50    Years: 26.00    Pack years: 13.00    Types: Cigarettes    Quit date: 06/07/2015    Years since quitting: 3.6  . Smokeless tobacco: Never Used  Substance Use Topics  . Alcohol use: Yes    Comment: occasional   . Drug use: No     Allergies   Morphine and related and Lactose intolerance (gi)   Review of Systems Review of Systems  All other systems reviewed and are negative.    Physical Exam Updated Vital Signs BP (!) 104/54   Pulse 76   Temp 98.6 F (37 C) (Oral)   Resp 16   LMP 01/30/2019   SpO2 96%   Physical Exam Vitals signs and nursing note reviewed.  Constitutional:      General: She is not in acute distress.    Appearance: She is well-developed and normal weight. She is not ill-appearing.  HENT:     Head: Normocephalic and atraumatic.     Right Ear: External ear normal.     Left Ear: External ear normal.  Eyes:      Conjunctiva/sclera: Conjunctivae normal.     Pupils: Pupils are equal, round, and reactive to light.  Neck:     Musculoskeletal: Normal range of motion and neck supple.     Trachea: Phonation normal.  Cardiovascular:     Rate and Rhythm: Normal rate and regular rhythm.     Heart sounds: Normal heart sounds.  Pulmonary:     Effort: Pulmonary effort is normal.     Breath sounds: Normal breath sounds.  Abdominal:     General: There is no distension.     Palpations: Abdomen is soft. There is no mass.     Tenderness: There is abdominal tenderness (Moderate left and right lower quadrant tenderness.). There is no guarding.  Genitourinary:    Comments: Normal external female genitalia.  No vaginal discharge.  Cervix is normal.  On bimanual examination, no cervical motion tenderness, adnexal tenderness or mass.  Ovaries were not palpable.  Exam limited by obesity. Musculoskeletal: Normal range of motion.  Skin:    General: Skin is warm and dry.  Neurological:     Mental Status: She is alert and oriented to person, place, and time.     Cranial Nerves: No cranial nerve deficit.     Sensory: No sensory deficit.     Motor: No abnormal muscle tone.     Coordination: Coordination normal.  Psychiatric:        Mood and Affect: Mood normal.        Behavior: Behavior normal.        Thought Content: Thought content normal.        Judgment: Judgment normal.      ED Treatments / Results  Labs (all labs ordered are listed, but only abnormal results are displayed) Labs Reviewed  WET PREP, GENITAL - Abnormal; Notable for the following components:      Result Value   Clue Cells Wet Prep HPF POC PRESENT (*)    WBC, Wet Prep HPF POC MANY (*)    All other components within normal limits  RPR  HCG, QUANTITATIVE, PREGNANCY  HIV ANTIBODY (ROUTINE TESTING W REFLEX)  GC/CHLAMYDIA PROBE AMP ([place]) NOT AT [facility]    EKG None  Radiology No results found.  Procedures Procedures (including  critical care time)  Medications Ordered in ED Medications  oxyCODONE-acetaminophen (PERCOCET/ROXICET) 5-325 MG per tablet 2 tablet (2 tablets Oral Given 01/30/19 1616)  HYDROmorphone (DILAUDID) injection 1 mg (1 mg Intravenous Given 01/30/19 1959)  ondansetron (ZOFRAN) injection 4 mg (4 mg Intravenous Given 01/30/19 1957)  cefTRIAXone (ROCEPHIN) injection 250 mg (250 mg Intramuscular Given 01/30/19 2001)  azithromycin (ZITHROMAX) powder 1 g (1 g Oral Given 01/30/19 2005)  sterile water (preservative free) injection (0.9 mLs  Given 01/30/19 2001)     Initial Impression / Assessment and Plan / ED Course  I have reviewed the triage vital signs and the nursing notes.  Pertinent labs & imaging results that were available during my care of the patient were reviewed by me and considered in my medical decision making (see chart for details).  Clinical Course as of Jan 30 1549  Mon Jan 31, 2019  1548 Normal except clue and white cells present  Wet prep, genital(!) [EW]  1548 negative  hCG, quantitative, pregnancy [EW]    Clinical Course User Index [EW] Daleen Bo, MD        Patient Vitals for the past 24 hrs:  BP Pulse Resp SpO2  01/30/19 2030 (!) 104/54 76 16 96 %  01/30/19 2015 102/75 83 16 96 %  01/30/19 1800 115/68 61 15 97 %  01/30/19 1745 - 65 - 97 %  01/30/19 1730 122/83 79 - 98 %  01/30/19 1715 - 68 - 98 %  01/30/19 1700 - 86 - 98 %  01/30/19 1645 - 71 - 100 %  01/30/19 1630 126/66 64 - 99 %  01/30/19 1620 (!) 151/97 73 16 100 %    At D/C- Reevaluation with update and discussion. After initial assessment and treatment, an updated evaluation reveals she is more comfortable. Findings discussed and questions answered. Fertile Decision Making: Pelvic pain with vaginal discharge and concern for STD.  Physical exam does not indicate PID.  Patient prefers to be treated pending laboratory results returned.  Doubt serious bacterial infection or metabolic  instability.  CRITICAL CARE-no Performed by: Daleen Bo  Nursing Notes Reviewed/ Care Coordinated Applicable Imaging Reviewed Interpretation of Laboratory Data incorporated into ED treatment  The patient appears reasonably screened and/or stabilized for discharge and I doubt any other medical condition or other [facility] requiring further screening, evaluation, or treatment in the ED at this time prior to discharge.  Plan: Home Medications- OTC analgesia; Home Treatments-gradually advance diet and activity; return here if the recommended treatment, does not improve the symptoms; Recommended follow up-PCP as needed   Final Clinical Impressions(s) / ED Diagnoses   Final diagnoses:  Pelvic pain in female    ED Discharge Orders         Ordered    oxyCODONE-acetaminophen (PERCOCET/ROXICET) 5-325 MG tablet  Every 4 hours PRN     01/30/19 2018           Daleen Bo, MD 01/31/19 1551

## 2019-01-31 LAB — RPR: RPR Ser Ql: NONREACTIVE

## 2019-02-01 LAB — HIV ANTIBODY (ROUTINE TESTING W REFLEX): HIV Screen 4th Generation wRfx: NONREACTIVE

## 2019-02-02 ENCOUNTER — Emergency Department (HOSPITAL_COMMUNITY)
Admission: EM | Admit: 2019-02-02 | Discharge: 2019-02-02 | Disposition: A | Discharge status: Home / Self Care | Payer: Self-pay | Attending: Emergency Medicine | Admitting: Emergency Medicine

## 2019-02-02 ENCOUNTER — Emergency Department (HOSPITAL_COMMUNITY): Payer: Self-pay

## 2019-02-02 ENCOUNTER — Other Ambulatory Visit: Payer: Self-pay

## 2019-02-02 ENCOUNTER — Encounter (HOSPITAL_COMMUNITY): Payer: Self-pay | Admitting: Family Medicine

## 2019-02-02 DIAGNOSIS — R102 Pelvic and perineal pain: Secondary | ICD-10-CM | POA: Insufficient documentation

## 2019-02-02 DIAGNOSIS — Z87891 Personal history of nicotine dependence: Secondary | ICD-10-CM | POA: Insufficient documentation

## 2019-02-02 DIAGNOSIS — N838 Other noninflammatory disorders of ovary, fallopian tube and broad ligament: Secondary | ICD-10-CM | POA: Insufficient documentation

## 2019-02-02 LAB — URINALYSIS, ROUTINE W REFLEX MICROSCOPIC
Bilirubin Urine: NEGATIVE
Glucose, UA: NEGATIVE mg/dL
Hgb urine dipstick: NEGATIVE
Ketones, ur: NEGATIVE mg/dL
Leukocytes,Ua: NEGATIVE
Nitrite: NEGATIVE
Protein, ur: NEGATIVE mg/dL
Specific Gravity, Urine: 1.03 (ref 1.005–1.030)
pH: 5 (ref 5.0–8.0)

## 2019-02-02 LAB — PREGNANCY, URINE: Preg Test, Ur: NEGATIVE

## 2019-02-02 LAB — GC/CHLAMYDIA PROBE AMP (~~LOC~~) NOT AT ARMC
Chlamydia: NEGATIVE
Neisseria Gonorrhea: NEGATIVE

## 2019-02-02 MED ORDER — NAPROXEN 500 MG PO TABS
500.0000 mg | ORAL_TABLET | Freq: Once | ORAL | Status: AC
  Administered 2019-02-02: 500 mg via ORAL
  Filled 2019-02-02: qty 1

## 2019-02-02 NOTE — Discharge Instructions (Addendum)
Your ultrasound today showed a an enlarged right ovary, though you are tender throughout your lower abdomen.  This change is suspected to be normal for you and not contributing to your discomfort.  An enlarged ovary may be due to increased follicles or a simple ovarian cyst.  You have no evidence of infection or abscess to your ovaries or fallopian tubes.  No evidence of abnormal twisting of your ovaries today.  We recommend naproxen for management of pain as well as continued follow-up with an OB/GYN.  Schedule an appointment with women's outpatient clinic if you are not currently followed by an OB/GYN provider.  You may return to the ED for any new or concerning symptoms.

## 2019-02-02 NOTE — ED Triage Notes (Signed)
Patient is complaining of pelvic pain and lower abd pain, and urinary frequency. Patient states she was here Sunday with the same symptoms and requested to have your urine checked.

## 2019-02-02 NOTE — ED Notes (Signed)
Assisted [place] with chaperone, [place] tech at bedside.

## 2019-02-03 ENCOUNTER — Telehealth (HOSPITAL_COMMUNITY): Payer: Self-pay

## 2019-02-03 LAB — URINE CULTURE

## 2019-02-03 NOTE — ED Provider Notes (Signed)
[facility]-EMERGENCY DEPT Provider Note   CSN: [id number] Arrival date & time: 02/02/19  0001     History   [id number] Complaint Chief Complaint  Patient presents with   Pelvic Pain   Abdominal Pain    HPI Crystal Hernandez is a 43 y.o. female.     43 year old female presents to the emergency department for evaluation of pelvic pain.  Pain has remained constant since last evaluated in the emergency department on 01/30/2019.  Her pain has been constant and associated with urinary frequency.  She was evaluated for STIs at her last visit.  Noted to be negative for syphilis and HIV.  Was prophylactically treated for gonorrhea and chlamydia prior to discharge.  She states that her urine was not assessed and she is concerned about a UTI.  She states that her urine has been more foul-smelling.  No associated fevers, vomiting, diarrhea.  She has been more constipated lately.  The history is provided by the patient. No language interpreter was used.  Pelvic Pain Associated symptoms include abdominal pain.  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   Past Surgical History:  Procedure Laterality Date   ANTERIOR CRUCIATE LIGAMENT REPAIR     rt   COLPOSCOPY     KNEE SURGERY     TUBAL LIGATION     urethral stint     WISDOM TOOTH EXTRACTION       OB History    Gravida  3   Para  3   Term  3   Preterm      AB      Living  3     SAB      TAB      Ectopic      Multiple      Live Births               Home Medications    Prior to Admission medications   Medication Sig Start Date End Date Taking? Authorizing Provider  acetaminophen (TYLENOL) 500 MG tablet Take  1,000 mg by mouth every 6 (six) hours as needed for moderate pain or headache.     [provider]  acidophilus (RISAQUAD) CAPS capsule Take 1 capsule by mouth daily.    [provider]  amoxicillin (AMOXIL) 500 MG capsule Take 1 capsule (500 mg total) by mouth 3 (three) times daily. Patient not taking: Reported on 01/30/2019 05/31/17   Raeford RazorKohut, Stephen, MD  Clobetasol Prop Emollient Base 0.05 % emollient cream Apply to affected area at bedtime daily. Patient not taking: Reported on 01/30/2019 04/13/17   Currie ParisBurleson, Terri L, NP  Multiple Vitamin (MULTIVITAMIN) tablet Take 1 tablet by mouth daily.    [provider]  oxyCODONE-acetaminophen (PERCOCET/ROXICET) 5-325 MG tablet Take 1 tablet by mouth every 4 (four) hours as needed for severe pain. 01/30/19   Mancel BaleWentz, Elliott, MD  triamcinolone ointment (KENALOG) 0.5 % Apply 1 application topically 2 (two) times daily. Patient not taking: Reported on 01/30/2019 08/09/18   Hedges, Tinnie GensJeffrey, PA-C  vitamin C (ASCORBIC ACID) 500 MG tablet Take 500 mg by mouth daily.  [provider]    Family History Family History  Problem Relation Age of Onset   Cancer Father        2000   Anesthesia problems Neg Hx    Hypotension Neg Hx    Malignant hyperthermia Neg Hx    Pseudochol deficiency Neg Hx     Social History Social History   Tobacco Use   Smoking status: Former Smoker    Packs/day: 0.50    Years: 26.00    Pack years: 13.00    Types: Cigarettes    Quit date: 06/07/2015    Years since quitting: 3.6   Smokeless tobacco: Never Used  Substance Use Topics   Alcohol use: Yes    Comment: "once a blue moon"   Drug use: No     Allergies   Morphine and related and Lactose intolerance (gi)   Review of Systems Review of Systems  Gastrointestinal: Positive for abdominal pain.  Genitourinary: Positive for pelvic pain.  Ten systems reviewed and are negative for acute change, except as noted in the HPI.     Physical Exam Updated Vital Signs BP 113/72    Pulse 64    Temp 98.3 F (36.8 C) (Oral)    Resp 15    Ht 5\' 4"  (1.626 m)    Wt 85.7 kg    LMP 01/22/2019    SpO2 97%    BMI 32.44 kg/m   Physical Exam Vitals signs and nursing note reviewed.  Constitutional:      General: She is not in acute distress.    Appearance: She is well-developed. She is not diaphoretic.     Comments: Nontoxic appearing and in NAD  HENT:     Head: Normocephalic and atraumatic.  Eyes:     General: No scleral icterus.    Conjunctiva/sclera: Conjunctivae normal.  Neck:     Musculoskeletal: Normal range of motion.  Pulmonary:     Effort: Pulmonary effort is normal. No respiratory distress.     Comments: Respirations even and unlabored Abdominal:     Comments: Soft, nondistended abdomen.  There is tenderness across the lower abdomen without guarding or peritoneal signs.  No palpable masses.  Genitourinary:    Comments: Deferred Musculoskeletal: Normal range of motion.  Skin:    General: Skin is warm and dry.     Coloration: Skin is not pale.     Findings: No erythema or rash.  Neurological:     General: No focal deficit present.     Mental Status: She is alert and oriented to person, place, and time.     Coordination: Coordination normal.  Psychiatric:        Behavior: Behavior normal.      ED Treatments / Results  Labs (all labs ordered are listed, but only abnormal results are displayed) Labs Reviewed  URINALYSIS, ROUTINE W REFLEX MICROSCOPIC - Abnormal; Notable for the following components:      Result Value   APPearance HAZY (*)    All other components within normal limits  URINE CULTURE  PREGNANCY, URINE    EKG None  Radiology US Pelvic Doppler (torsion R/o Or Mass Arterial Flow)  Result Date: 02/02/2019 CLINICAL DATA:  Pelvic pain for 4 days EXAM: TRANSABDOMINAL AND TRANSVAGINAL ULTRASOUND OF PELVIS DOPPLER ULTRASOUND OF OVARIES TECHNIQUE: Both transabdominal and transvaginal  ultrasound examinations of the pelvis were performed. Transabdominal technique was performed for global imaging of the pelvis including uterus, ovaries, adnexal regions, and pelvic cul-de-sac. It was necessary to  proceed with endovaginal exam following the transabdominal exam to visualize the uterus, endometrium, and ovaries. Color and duplex Doppler ultrasound was utilized to evaluate blood flow to the ovaries. COMPARISON:  Pelvic ultrasound 02/14/2014, CT pelvis 06/22/2012 FINDINGS: Uterus Measurements: 9.3 x 4.4 x 5.7 cm = volume: 121.7 mL. No fibroids or other mass visualized. Endometrium Thickness: 10 mm.  No focal abnormality visualized. Right ovary Measurements: 5.0 x 3.1 x 3.7 cm = volume: 29.7 mL. Multiple follicles are present in the right ovary. There is a lobular, echogenic intraovarian focus measuring 1.7 x 1.3 x 1.3 cm which may reflect a corpus albicans. Additional cyst measuring 2.2 x 1.6 x 1.9 cm has a small internal daughter cyst which is a reassuring feature of a functional ovarian cyst. Left ovary Measurements: 1.6 x 0.6 x 1.4 cm = volume: 0.72 mL. Plan appearance of the left ovarian parenchyma. No concerning left adnexal lesions. Pulsed Doppler evaluation of both ovaries demonstrates normal low-resistance arterial and venous waveforms. Other findings No abnormal free fluid. IMPRESSION: Asymmetrically enlarged right ovary is nonspecific given presence of normal arteriovenous flow. Could correlate with laterality of patient's pelvic pain as this could feasibly reflect intermittent torsion or a torsion/detorsion pathology. However, difference in size can be due in part to the presence of more numerous follicles including a benign functional ovarian cyst and a hyperechoic lobular focus likely reflecting a corpus albicans. Electronically Signed   By: Kreg ShropshirePrice  DeHay M.D.   On: 02/02/2019 05:38   [place] Pelvic Complete With Transvaginal  Result Date: 02/02/2019 CLINICAL DATA:  Pelvic pain for 4 days EXAM:  TRANSABDOMINAL AND TRANSVAGINAL ULTRASOUND OF PELVIS DOPPLER ULTRASOUND OF OVARIES TECHNIQUE: Both transabdominal and transvaginal ultrasound examinations of the pelvis were performed. Transabdominal technique was performed for global imaging of the pelvis including uterus, ovaries, adnexal regions, and pelvic cul-de-sac. It was necessary to proceed with endovaginal exam following the transabdominal exam to visualize the uterus, endometrium, and ovaries. Color and duplex Doppler ultrasound was utilized to evaluate blood flow to the ovaries. COMPARISON:  Pelvic ultrasound 02/14/2014, CT pelvis 06/22/2012 FINDINGS: Uterus Measurements: 9.3 x 4.4 x 5.7 cm = volume: 121.7 mL. No fibroids or other mass visualized. Endometrium Thickness: 10 mm.  No focal abnormality visualized. Right ovary Measurements: 5.0 x 3.1 x 3.7 cm = volume: 29.7 mL. Multiple follicles are present in the right ovary. There is a lobular, echogenic intraovarian focus measuring 1.7 x 1.3 x 1.3 cm which may reflect a corpus albicans. Additional cyst measuring 2.2 x 1.6 x 1.9 cm has a small internal daughter cyst which is a reassuring feature of a functional ovarian cyst. Left ovary Measurements: 1.6 x 0.6 x 1.4 cm = volume: 0.72 mL. Plan appearance of the left ovarian parenchyma. No concerning left adnexal lesions. Pulsed Doppler evaluation of both ovaries demonstrates normal low-resistance arterial and venous waveforms. Other findings No abnormal free fluid. IMPRESSION: Asymmetrically enlarged right ovary is nonspecific given presence of normal arteriovenous flow. Could correlate with laterality of patient's pelvic pain as this could feasibly reflect intermittent torsion or a torsion/detorsion pathology. However, difference in size can be due in part to the presence of more numerous follicles including a benign functional ovarian cyst and a hyperechoic lobular focus likely reflecting a corpus albicans. Electronically Signed   By: Kreg ShropshirePrice  DeHay M.D.    On: 02/02/2019 05:38    Procedures Procedures (including critical care time)  Medications Ordered in ED Medications  naproxen (NAPROSYN) tablet 500 mg (500 mg Oral Given 02/02/19 0425)  Initial Impression / Assessment and Plan / ED Course  I have reviewed the triage vital signs and the nursing notes.  Pertinent labs & imaging results that were available during my care of the patient were reviewed by me and considered in my medical decision making (see chart for details).        43 year old female presents for persistent pelvic pain.  She had a reassuring pelvic exam during her last visit 3 days ago.  Was prophylactically treated for gonorrhea and chlamydia.  Her physical exam is not consistent with PID.  Urinalysis without evidence of UTI.  A pelvic ultrasound was performed.  Based on symptoms and chronicity, doubt torsion.  No concern for TOA.  Findings are favorable for dominant right follicles or ovarian cyst.  On chart review, patient has been seen numerous times in the emergency department for various pelvic complaints.  Will refer to OB/GYN for follow-up.  Encouraged to continue over-the-counter NSAIDs for pain.  Given her recent constipation, this may also be contributing to her discomfort.  Recommended use of MiraLAX.  Return precautions discussed and provided. Patient discharged in stable condition with no unaddressed concerns.   Final Clinical Impressions(s) / ED Diagnoses   Final diagnoses:  Pelvic pain in female  Enlarged ovary    ED Discharge Orders    None       Antonietta Breach, PA-C 09/47/09 [id number]    Delora Fuel, MD 66/[birth year]/47 [phone]

## 2019-02-03 NOTE — Telephone Encounter (Signed)
Medication called in to pt.s pharmacy , [facility] , [place], [phone]:  Flagyl 500 mg po BID X 14 , 0 refills per Dr. Eulis Pasley.  Called pt. To inform her. No further questions.

## 2019-03-07 ENCOUNTER — Telehealth: Payer: Self-pay | Admitting: Family Medicine

## 2019-03-07 NOTE — Telephone Encounter (Signed)
Called the patient to confirm the upcoming visit. The patient answered no to the covid screening questions. Also informed the patient of no children or visitors due to the covid restrictions. The patient verbalized understanding. °

## 2019-03-08 ENCOUNTER — Other Ambulatory Visit: Payer: Self-pay

## 2019-03-08 ENCOUNTER — Encounter: Payer: Self-pay | Admitting: Family Medicine

## 2019-03-08 ENCOUNTER — Ambulatory Visit (INDEPENDENT_AMBULATORY_CARE_PROVIDER_SITE_OTHER): Payer: Self-pay | Admitting: Family Medicine

## 2019-03-08 DIAGNOSIS — N3281 Overactive bladder: Secondary | ICD-10-CM

## 2019-03-08 DIAGNOSIS — N83209 Unspecified ovarian cyst, unspecified side: Secondary | ICD-10-CM | POA: Insufficient documentation

## 2019-03-08 DIAGNOSIS — N83201 Unspecified ovarian cyst, right side: Secondary | ICD-10-CM

## 2019-03-08 NOTE — Progress Notes (Signed)
GYNECOLOGY ENCOUNTER NOTE  Subjective:   Crystal Hernandez is a 43 y.o. [phone] female here for follow up of right lower quadrant pain and a right ovarian cyst.  She initially presented to the emergency department on 8/30 with constant pelvic pain associated with urinary frequency. STI testing at that time was negative, and she was prophylactically treated for CG/CT.    She returned to the ED on 9/2 with the same pain. UA at that time was negative, and pelvic ultrasound was performed.  Today she says that her right lower quadrant pain has improved, but she still has urinary frequency/urgency. She has to wear a pad in her underwear due to constant leaking. She will leak more when coughing, sneezing, or laughing. She has had three vaginal deliveries, and her mother had these symptoms as well and also had to have a bladder sling.  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      acidophilus (RISAQUAD) CAPS capsule Take 1 capsule by mouth daily.     Multiple Vitamin (MULTIVITAMIN) tablet Take 1  tablet by mouth daily.     vitamin C (ASCORBIC ACID) 500 MG tablet Take 500 mg by mouth daily.     amoxicillin (AMOXIL) 500 MG capsule Take 1 capsule (500 mg total) by mouth 3 (three) times daily. (Patient not taking: Reported on 01/30/2019) 21 capsule 0   Clobetasol Prop Emollient Base 0.05 % emollient cream Apply to affected area at bedtime daily. (Patient not taking: Reported on 01/30/2019) 60 g 2   oxyCODONE-acetaminophen (PERCOCET/ROXICET) 5-325 MG tablet Take 1 tablet by mouth every 4 (four) hours as needed for severe pain. (Patient not taking: Reported on 03/08/2019) 10 tablet 0   triamcinolone ointment (KENALOG) 0.5 % Apply 1 application topically 2 (two) times daily. (Patient not taking: Reported on 01/30/2019) 15 g 4   No current facility-administered medications on file prior to visit.     Allergies  Allergen Reactions   Morphine And Related Itching    Had severe itching and was given benadryl.    Lactose Intolerance (Gi)     Gi upset     Social History  Socioeconomic History   Marital status: Legally Separated    Spouse name: Not on file   Number of children: Not on file   Years of education: Not on file   Highest education level: Not on file  Occupational History   Not on file  Social Needs   Financial resource strain: Not on file   Food insecurity    Worry: Not on file    Inability: Not on file   Transportation needs    Medical: Not on file    Non-medical: Not on file  Tobacco Use   Smoking status: Former Smoker    Packs/day: 0.50    Years: 26.00    Pack years: 13.00    Types: Cigarettes    Quit date: 06/07/2015    Years since quitting: 3.7   Smokeless tobacco: Never Used  Substance and Sexual Activity   Alcohol use: Yes    Comment: "once a blue moon"   Drug use: No   Sexual activity: Yes    Birth control/protection: Surgical    Comment: tubal  Lifestyle   Physical activity    Days per week: Not on file    Minutes per session: Not  on file   Stress: Not on file  Relationships   Social connections    Talks on phone: Not on file    Gets together: Not on file    Attends religious service: Not on file    Active member of club or organization: Not on file    Attends meetings of clubs or organizations: Not on file    Relationship status: Not on file   Intimate partner violence    Fear of current or ex partner: Not on file    Emotionally abused: Not on file    Physically abused: Not on file    Forced sexual activity: Not on file  Other Topics Concern   Not on file  Social History Narrative   Not on file    Family History  Problem Relation Age of Onset   Cancer Father        2000   Anesthesia problems Neg Hx    Hypotension Neg Hx    Malignant hyperthermia Neg Hx    Pseudochol deficiency Neg Hx    Review of Systems Pertinent items are noted in HPI.   Objective:  BP (!) 148/101    Pulse 91    Temp 98.5 F (36.9 C)    Wt 201 lb (91.2 kg)    BMI 34.50 kg/m  CONSTITUTIONAL: Well-developed, well-nourished female in no acute distress.  HENT:  Normocephalic, atraumatic, External right and left ear normal. Oropharynx is clear and moist EYES: Conjunctivae and EOM are normal. Pupils are equal, round, and reactive to light. No scleral icterus.  NECK: Normal range of motion, supple, no masses.  Normal thyroid.  SKIN: Skin is warm and dry. No rash noted. Not diaphoretic. No erythema. No pallor. NEUROLOGIC: Alert and oriented to person, place, and time. Normal reflexes, muscle tone coordination. No cranial nerve deficit noted. PSYCHIATRIC: Normal mood and affect. Normal behavior. Normal judgment and thought content. CARDIOVASCULAR: Normal heart rate noted, regular rhythm RESPIRATORY: Clear to auscultation bilaterally. Effort and breath sounds normal, no problems with respiration noted. ABDOMEN: Obese. Soft, normal bowel sounds, no distention noted.  No rebound or guarding. Some mild tenderness to palpation of  the right lower quadrant. PELVIC: Deferred/declined. MUSCULOSKELETAL: Normal range of motion. No tenderness.  No cyanosis, clubbing, or edema. 2+ distal pulses.  IMAGING RESULTS:  US Pelvic Doppler (torsion R/o Or Mass Arterial Flow)  Result Date: 02/02/2019 CLINICAL DATA:  Pelvic pain for 4 days EXAM: TRANSABDOMINAL AND TRANSVAGINAL ULTRASOUND OF PELVIS DOPPLER ULTRASOUND OF OVARIES TECHNIQUE: Both transabdominal and transvaginal ultrasound examinations of the pelvis were performed. Transabdominal technique was performed for global imaging of the pelvis including uterus, ovaries, adnexal regions, and pelvic cul-de-sac. It was necessary to proceed with endovaginal exam following the transabdominal exam to visualize the uterus, endometrium, and ovaries. Color and duplex Doppler ultrasound was utilized to evaluate blood flow to the ovaries. COMPARISON:  Pelvic ultrasound 02/14/2014, CT pelvis 06/22/2012 FINDINGS: Uterus Measurements: 9.3 x 4.4 x 5.7 cm = volume: 121.7 mL. No fibroids or other mass visualized. Endometrium Thickness: 10 mm.  No focal abnormality visualized. Right ovary Measurements: 5.0 x 3.1 x 3.7 cm = volume: 29.7 mL. Multiple follicles are present in the right ovary. There is a lobular, echogenic intraovarian focus measuring 1.7 x 1.3 x 1.3 cm which may reflect a corpus albicans. Additional cyst measuring 2.2 x 1.6 x 1.9 cm has a small internal daughter cyst which is a reassuring feature of a functional ovarian cyst. Left ovary Measurements: 1.6 x 0.6 x 1.4 cm = volume: 0.72 mL. Plan appearance of the left ovarian parenchyma. No concerning left adnexal lesions. Pulsed Doppler evaluation of both ovaries demonstrates normal low-resistance arterial and venous waveforms. Other findings No abnormal free fluid. IMPRESSION: Asymmetrically enlarged right ovary is nonspecific given presence of normal arteriovenous flow. Could correlate with laterality of patient's pelvic pain as this could feasibly  reflect intermittent torsion or a torsion/detorsion pathology. However, difference in size can be due in part to the presence of more numerous follicles including a benign functional ovarian cyst and a hyperechoic lobular focus likely reflecting a corpus albicans. Electronically Signed   By: Lovena Le M.D.   On: 02/02/2019 05:38   US Pelvic Complete With Transvaginal  Result Date: 02/02/2019 CLINICAL DATA:  Pelvic pain for 4 days EXAM: TRANSABDOMINAL AND TRANSVAGINAL ULTRASOUND OF PELVIS DOPPLER ULTRASOUND OF OVARIES TECHNIQUE: Both transabdominal and transvaginal ultrasound examinations of the pelvis were performed. Transabdominal technique was performed for global imaging of the pelvis including uterus, ovaries, adnexal regions, and pelvic cul-de-sac. It was necessary to proceed with endovaginal exam following the transabdominal exam to visualize the uterus, endometrium, and ovaries. Color and duplex Doppler ultrasound was utilized to evaluate blood flow to the ovaries. COMPARISON:  Pelvic ultrasound 02/14/2014, CT pelvis 06/22/2012 FINDINGS: Uterus Measurements: 9.3 x 4.4 x 5.7 cm = volume: 121.7 mL. No fibroids or other mass visualized. Endometrium Thickness: 10 mm.  No focal abnormality visualized. Right ovary Measurements: 5.0 x 3.1 x 3.7 cm = volume: 29.7 mL. Multiple follicles are present in the right ovary. There is a lobular, echogenic intraovarian focus measuring 1.7 x 1.3 x 1.3 cm which may reflect a corpus albicans. Additional cyst measuring 2.2 x 1.6 x 1.9 cm has a small internal daughter cyst which is a reassuring feature of a functional ovarian cyst. Left ovary Measurements: 1.6 x 0.6 x 1.4 cm = volume: 0.72 mL. Plan appearance of the left ovarian parenchyma. No concerning left adnexal lesions. Pulsed Doppler evaluation of both ovaries demonstrates normal low-resistance arterial and venous waveforms. Other findings No abnormal free fluid. IMPRESSION: Asymmetrically enlarged right ovary is  nonspecific given presence of normal arteriovenous flow. Could correlate with laterality of patient's pelvic pain as this could feasibly reflect intermittent torsion or a torsion/detorsion pathology. However, difference in size can  be due in part to the presence of more numerous follicles including a benign functional ovarian cyst and a hyperechoic lobular focus likely reflecting a corpus albicans. Electronically Signed   By: Kreg Shropshire M.D.   On: 02/02/2019 05:38     Assessment and Plan:   1. Cyst of right ovary -US results discussed with the patient; reassuring for physiological right ovarian cyst and corpus albicans - Repeat US in 2-4 months (original done 9/2) for follow up, follow up appt with MD-GYN - US Transvaginal Non-OB; Future -Return precautions given - Ambulatory Referral to [facility] for preventative screening  2. Overactive bladder v Urge incontinence - Ambulatory referral to Urology - Ambulatory Referral to [facility], Margarette Asal, DO OB Fellow, Faculty Practice 03/08/2019 12:11 PM

## 2019-03-11 ENCOUNTER — Telehealth (HOSPITAL_COMMUNITY): Payer: Self-pay

## 2019-03-11 NOTE — Telephone Encounter (Signed)
Telephoned patient at home number. Left voice call and schedule appointment with [facility].

## 2019-03-21 ENCOUNTER — Ambulatory Visit (HOSPITAL_COMMUNITY)
Admission: RE | Admit: 2019-03-21 | Discharge: 2019-03-21 | Disposition: A | Discharge status: Home / Self Care | Payer: Self-pay | Source: Ambulatory Visit | Attending: Family Medicine | Admitting: Family Medicine

## 2019-03-21 ENCOUNTER — Other Ambulatory Visit: Payer: Self-pay

## 2019-03-21 DIAGNOSIS — R102 Pelvic and perineal pain: Secondary | ICD-10-CM | POA: Insufficient documentation

## 2019-03-21 NOTE — Progress Notes (Signed)
US order entered.

## 2019-05-09 ENCOUNTER — Other Ambulatory Visit: Payer: Self-pay

## 2019-05-09 ENCOUNTER — Other Ambulatory Visit: Payer: Self-pay | Admitting: *Deleted

## 2019-05-09 DIAGNOSIS — Z124 Encounter for screening for malignant neoplasm of cervix: Secondary | ICD-10-CM

## 2019-05-09 NOTE — Progress Notes (Signed)
Patient: Crystal Hernandez           Date of Birth: 03/21/1976           MRN: [id number] Visit Date: 05/09/2019 PCP: Patient, No Pcp Per Temp: 97.3 Temporal    Cervical Exam Per patient has a history of an abnormal Pap smear in 2004 that a colposcopy and [facility] were completed for follow-up. Per patient has no had any Pap smears completed since CKC. Let patient know that if today's Pap smear is normal that her next Pap smear is due in one year due to her history of an abnormal Pap smear.  Patient's History Patient Active Problem List   Diagnosis Date Noted  . Ovarian cyst 03/08/2019  . Overactive bladder 03/08/2019   Past Medical History:  Diagnosis Date  . Abnormal Pap smear   . Depression   . 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 Infection    UTI  . Kidney abscess   . Seasonal allergies   . Vaginal Pap smear, abnormal     Family History  Problem Relation Age of Onset  . Cancer Father        53  . Anesthesia problems Neg Hx   . Hypotension Neg Hx   . Malignant hyperthermia Neg Hx   . Pseudochol deficiency Neg Hx     Social History   Occupational History  . Not on file  Tobacco Use  . Smoking status: Former Smoker    Packs/day: 0.50    Years: 26.00    Pack years: 13.00    Types: Cigarettes    Quit date: 06/07/2015    Years since quitting: 3.9  . Smokeless tobacco: Never Used  Substance and Sexual Activity  . Alcohol use: Yes    Comment: "once a blue moon"  . Drug use: No  . Sexual activity: Yes    Birth control/protection: Surgical    Comment: tubal

## 2019-05-12 LAB — CYTOLOGY - PAP
Comment: NEGATIVE
Diagnosis: NEGATIVE
High risk HPV: NEGATIVE

## 2019-05-20 ENCOUNTER — Ambulatory Visit (INDEPENDENT_AMBULATORY_CARE_PROVIDER_SITE_OTHER): Payer: BC Managed Care – PPO | Admitting: Family Medicine

## 2019-05-20 ENCOUNTER — Encounter (INDEPENDENT_AMBULATORY_CARE_PROVIDER_SITE_OTHER): Payer: Self-pay | Admitting: Family Medicine

## 2019-05-20 ENCOUNTER — Encounter (INDEPENDENT_AMBULATORY_CARE_PROVIDER_SITE_OTHER): Payer: Self-pay

## 2019-05-20 VITALS — BP 115/77 | HR 72 | Temp 98.0°F | Ht 64.57 in | Wt 132.0 lb

## 2019-05-20 DIAGNOSIS — Z0000 Encounter for general adult medical examination without abnormal findings: Secondary | ICD-10-CM

## 2019-05-20 NOTE — Progress Notes (Signed)
Subjective:     Patient ID:  Deborah Kirk is a 43 y.o. female.       43 y.o. female here today for annual examination.    Eye exam: up to date  Dental exam: up to date  Tetanus: up to date and due 2026    Colon cancer screening: up to date and due age 89  Breast cancer screening: not up to date  Cervical cancer screening: not up to date; history positive HPV in 2017 - saw Dr. Providence Crosby        Current Outpatient Medications   Medication Sig    buPROPion (WELLBUTRIN) 100 MG tablet Take 100 mg by mouth daily    eszopiclone (LUNESTA) 1 MG tablet Take 1 tablet (1 mg total) by mouth nightly.Take immediately before bedtime    methylphenidate (CONCERTA) 27 MG CR tablet Take 27 mg by mouth every morning    Multiple Vitamin (MULTIVITAMIN) capsule Take 1 capsule by mouth daily.    Vortioxetine HBr (Trintellix) 20 MG Tab Take by mouth    ondansetron (ZOFRAN-ODT) 4 MG disintegrating tablet 1 TAB ONTO THE TONGUE 4 TIMES PER DAY AS NEEDED FOR NAUSEA AND VOMITING FOR 5 DAY(S)    scopolamine (TRANSDERM-SCOP) 1.5 mg Place 1 patch onto the skin every third day.         The following portions of the patient's history were reviewed and updated as appropriate: allergies, current medications, past family history, past medical history, past social history, past surgical history and problem list.      Review of Systems   Constitutional: Negative for chills, fatigue and fever.   HENT: Negative for congestion, ear pain, rhinorrhea, sinus pressure and sore throat.    Eyes: Negative for pain and itching.   Respiratory: Negative for cough, shortness of breath and wheezing.    Cardiovascular: Negative for chest pain and palpitations.   Gastrointestinal: Negative for abdominal pain, constipation, diarrhea, nausea and vomiting.   Genitourinary: Negative for dysuria, frequency and urgency.   Musculoskeletal: Negative for arthralgias and back pain.   Neurological: Negative for dizziness, light-headedness and headaches.    Psychiatric/Behavioral: Negative for decreased concentration and dysphoric mood. The patient is not nervous/anxious.    All other systems reviewed and are negative.      BP 115/77    Pulse 72    Temp 98 F (36.7 C) (Oral)    Ht 1.64 m (5' 4.57")    Wt 59.9 kg (132 lb)    LMP 05/03/2019 (Approximate)    BMI 22.26 kg/m      Objective:   Physical Exam   Constitutional: She appears well-developed and well-nourished. No distress.   HENT:   Head: Normocephalic and atraumatic.   Right Ear: Hearing, tympanic membrane, external ear and ear canal normal.   Left Ear: Hearing, tympanic membrane, external ear and ear canal normal.   Nose: Nose normal. No mucosal edema.   Mouth/Throat: Oropharynx is clear and moist. No oropharyngeal exudate.   Eyes: Pupils are equal, round, and reactive to light. Conjunctivae and EOM are normal. Right eye exhibits no discharge. Left eye exhibits no discharge.   Neck: Normal range of motion.   Cardiovascular: Normal rate, regular rhythm and normal heart sounds. Exam reveals no gallop and no friction rub.   No murmur heard.  Pulmonary/Chest: Effort normal and breath sounds normal. No respiratory distress. She has no wheezes. She has no rales.   Abdominal: Soft. Bowel sounds are normal. She exhibits no distension. There is  no abdominal tenderness. There is no rebound.   Musculoskeletal: Normal range of motion.         General: No tenderness or edema.   Lymphadenopathy:     She has no cervical adenopathy.   Neurological: She is alert. She has normal reflexes. No cranial nerve deficit.   Skin: Skin is warm and dry. No rash noted.   Psychiatric: She has a normal mood and affect. Her behavior is normal.   Nursing note and vitals reviewed.         Assessment:       ICD-10-CM    1. Routine general medical examination at a health care facility  Z00.00        Plan:     1.  Well adult female.  Labs for screening purposes - she will return when insurance changes.  Immunizations for age up to date via  records.    Immunization History   Administered Date(s) Administered    Influenza quad 6 MOS to 64 YRS (Flulaval/Fluarix) 03/03/2019    Influenza quadrivalent (IM) 6 months & up PRESERVED 03/03/2014    Influenza quadrivalent (IM) PF 3 Yrs & greater 02/02/2015, 03/27/2016    PPD Test 02/07/2015    Tdap 02/02/2015         Advised to see OBGYN for cervical cancer screening.      Risk & benefits of any medication(s) given/prescribed were explained to the patient (and/or family) who verbalized understanding & agreed to the treatment plan.  Patient (and/or family) encouraged to contact me/clinical staff with any questions/concerns.      Procedures  :none

## 2019-05-23 ENCOUNTER — Ambulatory Visit: Payer: Self-pay | Admitting: Family Medicine

## 2019-05-23 ENCOUNTER — Encounter (HOSPITAL_COMMUNITY): Payer: Self-pay

## 2019-05-23 ENCOUNTER — Telehealth (HOSPITAL_COMMUNITY): Payer: Self-pay | Admitting: *Deleted

## 2019-05-23 NOTE — Telephone Encounter (Signed)
Pap smear result letter mailed to patient.

## 2019-06-09 ENCOUNTER — Ambulatory Visit: Payer: Self-pay | Admitting: Obstetrics & Gynecology

## 2019-07-19 ENCOUNTER — Encounter (INDEPENDENT_AMBULATORY_CARE_PROVIDER_SITE_OTHER): Payer: Self-pay | Admitting: Family Medicine

## 2019-07-22 ENCOUNTER — Encounter (INDEPENDENT_AMBULATORY_CARE_PROVIDER_SITE_OTHER): Payer: Self-pay | Admitting: Family Medicine

## 2019-07-22 NOTE — Progress Notes (Signed)
Previous msg sent to pcp

## 2019-07-26 ENCOUNTER — Encounter (INDEPENDENT_AMBULATORY_CARE_PROVIDER_SITE_OTHER): Payer: Self-pay | Admitting: Family Medicine

## 2019-08-02 ENCOUNTER — Telehealth (INDEPENDENT_AMBULATORY_CARE_PROVIDER_SITE_OTHER): Payer: Self-pay | Admitting: Family Medicine

## 2019-08-02 DIAGNOSIS — Z0000 Encounter for general adult medical examination without abnormal findings: Secondary | ICD-10-CM

## 2019-08-02 DIAGNOSIS — Z1231 Encounter for screening mammogram for malignant neoplasm of breast: Secondary | ICD-10-CM

## 2019-08-02 DIAGNOSIS — R6889 Other general symptoms and signs: Secondary | ICD-10-CM

## 2019-08-02 NOTE — Telephone Encounter (Signed)
Pt called to follow up on her MyChart msg. She had a physical on 05/20/19 and there were no labs or mammogram ordered. She would like the routine labs along with T3, T4, TSH, Hormone panel, and  Mammogram order.

## 2019-08-04 ENCOUNTER — Encounter (INDEPENDENT_AMBULATORY_CARE_PROVIDER_SITE_OTHER): Payer: Self-pay | Admitting: Family Medicine

## 2019-08-04 DIAGNOSIS — R6889 Other general symptoms and signs: Secondary | ICD-10-CM

## 2019-08-04 NOTE — Telephone Encounter (Signed)
Orders are in

## 2019-08-05 ENCOUNTER — Encounter (INDEPENDENT_AMBULATORY_CARE_PROVIDER_SITE_OTHER): Payer: Self-pay | Admitting: Family Medicine

## 2019-08-05 DIAGNOSIS — R6889 Other general symptoms and signs: Secondary | ICD-10-CM

## 2019-08-05 DIAGNOSIS — Z0000 Encounter for general adult medical examination without abnormal findings: Secondary | ICD-10-CM

## 2019-08-05 NOTE — Telephone Encounter (Signed)
Can you please put in the routine physical labs also

## 2019-08-05 NOTE — Telephone Encounter (Signed)
I spoke with the pt and she switched to [place]. She is going to upload her card through MyChart. [employer] falls under [employer]

## 2019-08-05 NOTE — Telephone Encounter (Signed)
I'm looked back at her chart.  The reason I didn't do labs when she was here was because she was going to change insurance.  Can you please confirm her new insurance so that when I order tests I only have to do it once with the appropriate lab?

## 2019-08-05 NOTE — Telephone Encounter (Signed)
Future Orders are in

## 2019-08-08 NOTE — Telephone Encounter (Signed)
Pt advised that orders are in and to updated insurance when she comes to get her labs drawn.

## 2019-09-19 ENCOUNTER — Other Ambulatory Visit (INDEPENDENT_AMBULATORY_CARE_PROVIDER_SITE_OTHER): Payer: Self-pay | Admitting: Family Medicine

## 2019-09-25 ENCOUNTER — Encounter (HOSPITAL_COMMUNITY): Payer: Self-pay

## 2019-09-25 ENCOUNTER — Other Ambulatory Visit: Payer: Self-pay

## 2019-09-25 DIAGNOSIS — Z5321 Procedure and treatment not carried out due to patient leaving prior to being seen by health care provider: Secondary | ICD-10-CM | POA: Insufficient documentation

## 2019-09-25 DIAGNOSIS — N898 Other specified noninflammatory disorders of vagina: Secondary | ICD-10-CM | POA: Insufficient documentation

## 2019-09-25 DIAGNOSIS — R1030 Lower abdominal pain, unspecified: Secondary | ICD-10-CM | POA: Insufficient documentation

## 2019-09-25 LAB — URINALYSIS, ROUTINE W REFLEX MICROSCOPIC
Bilirubin Urine: NEGATIVE
Glucose, UA: NEGATIVE mg/dL
Hgb urine dipstick: NEGATIVE
Ketones, ur: NEGATIVE mg/dL
Nitrite: POSITIVE — AB
Protein, ur: NEGATIVE mg/dL
Specific Gravity, Urine: 1.018 (ref 1.005–1.030)
pH: 6 (ref 5.0–8.0)

## 2019-09-25 LAB — COMPREHENSIVE METABOLIC PANEL
ALT: 38 U/L (ref 0–44)
AST: 30 U/L (ref 15–41)
Albumin: 3.5 g/dL (ref 3.5–5.0)
Alkaline Phosphatase: 77 U/L (ref 38–126)
Anion gap: 8 (ref 5–15)
BUN: 15 mg/dL (ref 6–20)
CO2: 22 mmol/L (ref 22–32)
Calcium: 8.6 mg/dL — ABNORMAL LOW (ref 8.9–10.3)
Chloride: 106 mmol/L (ref 98–111)
Creatinine, Ser: 0.53 mg/dL (ref 0.44–1.00)
GFR calc Af Amer: >60 mL/min (ref >60)
GFR calc non Af Amer: >60 mL/min (ref >60)
Glucose, Bld: 114 mg/dL — ABNORMAL HIGH (ref 70–99)
Potassium: 3.7 mmol/L (ref 3.5–5.1)
Sodium: 136 mmol/L (ref 135–145)
Total Bilirubin: 0.6 mg/dL (ref 0.3–1.2)
Total Protein: 6.9 g/dL (ref 6.5–8.1)

## 2019-09-25 LAB — CBC
HCT: 38.9 % (ref 36.0–46.0)
Hemoglobin: 12.6 g/dL (ref 12.0–15.0)
MCH: 30.8 pg (ref 26.0–34.0)
MCHC: 32.4 g/dL (ref 30.0–36.0)
MCV: 95.1 fL (ref 80.0–100.0)
Platelets: 194 10*3/uL (ref 150–400)
RBC: 4.09 MIL/uL (ref 3.87–5.11)
RDW: 12 % (ref 11.5–15.5)
WBC: 9.6 10*3/uL (ref 4.0–10.5)
nRBC: 0 % (ref 0.0–0.2)

## 2019-09-25 LAB — LIPASE, BLOOD: Lipase: 32 U/L (ref 11–51)

## 2019-09-25 LAB — I-STAT BETA HCG BLOOD, ED (MC, WL, AP ONLY): I-stat hCG, quantitative: <5 m[IU]/mL (ref <5)

## 2019-09-25 MED ORDER — SODIUM CHLORIDE 0.9% FLUSH: 3.0000 mL | Freq: Once | INTRAVENOUS | Status: DC

## 2019-09-25 NOTE — ED Triage Notes (Signed)
Pt reports lower abdominal pain for the last 2 weeks. She states that her cycle ended 2 weeks ago, but she has continued to have cramps. States that this morning the pain worsened. Reports a slight fishy odor. Denies vomiting or diarrhea.

## 2019-09-26 ENCOUNTER — Emergency Department (HOSPITAL_COMMUNITY)
Admission: EM | Admit: 2019-09-26 | Discharge: 2019-09-26 | Disposition: A | Discharge status: Home / Self Care | Payer: Self-pay | Attending: Emergency Medicine | Admitting: Emergency Medicine

## 2019-10-21 ENCOUNTER — Other Ambulatory Visit: Payer: Self-pay

## 2019-10-21 ENCOUNTER — Other Ambulatory Visit (INDEPENDENT_AMBULATORY_CARE_PROVIDER_SITE_OTHER): Payer: Self-pay | Admitting: Family Medicine

## 2019-10-21 NOTE — Addendum Note (Signed)
Addended by: Rosalva Ferron on: 10/21/2019 08:36 AM     Modules accepted: Orders

## 2019-10-21 NOTE — Addendum Note (Signed)
Addended by: Rosalva Ferron on: 10/21/2019 08:35 AM     Modules accepted: Orders

## 2019-10-21 NOTE — Addendum Note (Signed)
Addended by: OFORI, COLLINS A. on: 10/21/2019 08:36 AM     Modules accepted: Orders

## 2019-10-22 LAB — CBC AND DIFFERENTIAL
Baso(Absolute): 0.1 10*3/uL (ref 0.0–0.2)
Basos: 1 %
Eos: 4 %
Eosinophils Absolute: 0.3 10*3/uL (ref 0.0–0.4)
Hematocrit: 38.4 % (ref 34.0–46.6)
Hemoglobin: 12.5 g/dL (ref 11.1–15.9)
Immature Granulocytes Absolute: 0 10*3/uL (ref 0.0–0.1)
Immature Granulocytes: 0 %
Lymphocytes Absolute: 1.3 10*3/uL (ref 0.7–3.1)
Lymphocytes: 22 %
MCH: 28.4 pg (ref 26.6–33.0)
MCHC: 32.6 g/dL (ref 31.5–35.7)
MCV: 87 fL (ref 79–97)
Monocytes Absolute: 0.4 10*3/uL (ref 0.1–0.9)
Monocytes: 7 %
Neutrophils Absolute: 4 10*3/uL (ref 1.4–7.0)
Neutrophils: 66 %
Platelets: 470 10*3/uL — ABNORMAL HIGH (ref 150–450)
RBC: 4.4 x10E6/uL (ref 3.77–5.28)
RDW: 12.9 % (ref 11.7–15.4)
WBC: 6.1 10*3/uL (ref 3.4–10.8)

## 2019-10-22 LAB — COMPREHENSIVE METABOLIC PANEL
ALT: 12 IU/L (ref 0–32)
AST (SGOT): 18 IU/L (ref 0–40)
African American eGFR: 101 mL/min/{1.73_m2} (ref >59)
Albumin/Globulin Ratio: 1.4 (ref 1.2–2.2)
Albumin: 4.2 g/dL (ref 3.8–4.8)
Alkaline Phosphatase: 70 IU/L (ref 48–121)
BUN / Creatinine Ratio: 20 (ref 9–23)
BUN: 16 mg/dL (ref 6–24)
Bilirubin, Total: 0.6 mg/dL (ref 0.0–1.2)
CO2: 26 mmol/L (ref 20–29)
Calcium: 9.6 mg/dL (ref 8.7–10.2)
Chloride: 104 mmol/L (ref 96–106)
Creatinine: 0.81 mg/dL (ref 0.57–1.00)
Globulin, Total: 3 g/dL (ref 1.5–4.5)
Glucose: 92 mg/dL (ref 65–99)
Potassium: 4.8 mmol/L (ref 3.5–5.2)
Protein, Total: 7.2 g/dL (ref 6.0–8.5)
Sodium: 139 mmol/L (ref 134–144)
non-African American eGFR: 88 mL/min/{1.73_m2} (ref >59)

## 2019-10-22 LAB — LIPID PANEL
Cholesterol / HDL Ratio: 1.9 ratio (ref 0.0–4.4)
Cholesterol: 147 mg/dL (ref 100–199)
HDL: 76 mg/dL (ref >39)
LDL Chol Calculated (NIH): 61 mg/dL (ref 0–99)
Triglycerides: 44 mg/dL (ref 0–149)
VLDL Calculated: 10 mg/dL (ref 5–40)

## 2019-10-22 LAB — TSH: TSH: 0.543 u[IU]/mL (ref 0.450–4.500)

## 2019-10-22 LAB — T4, FREE: T4, Free: 1.06 ng/dL (ref 0.82–1.77)

## 2019-10-22 LAB — T3, FREE: T3, Free: 3.5 pg/mL (ref 2.0–4.4)

## 2019-11-03 ENCOUNTER — Ambulatory Visit (INDEPENDENT_AMBULATORY_CARE_PROVIDER_SITE_OTHER): Payer: Commercial Managed Care - POS | Admitting: Specialist

## 2019-11-03 ENCOUNTER — Encounter (INDEPENDENT_AMBULATORY_CARE_PROVIDER_SITE_OTHER): Payer: Self-pay | Admitting: Specialist

## 2019-11-03 VITALS — BP 106/72 | HR 82 | Temp 98.2°F | Wt 135.0 lb

## 2019-11-03 DIAGNOSIS — Z803 Family history of malignant neoplasm of breast: Secondary | ICD-10-CM

## 2019-11-03 DIAGNOSIS — Z01419 Encounter for gynecological examination (general) (routine) without abnormal findings: Secondary | ICD-10-CM

## 2019-11-03 DIAGNOSIS — N6489 Other specified disorders of breast: Secondary | ICD-10-CM

## 2019-11-03 NOTE — Progress Notes (Signed)
Subjective:       Patient ID: Deborah Kirk is a 44 y.o. female.    HPI She presents for an annual exam.  She is a case Administrator, sports at [employer]   her sister died of breast cancer this year and her mom had breast cancer as well  Her sister did not do genetic testing  Married, 2 kids 14 & 11yo.  She has never seen a breast specialist  Regular menses but lighter  Vasectomy  Pap wnl 2017 but hpv pos non 16/18  Last seen Sept 2017, so technically she is a new pt    The following portions of the patient's history were reviewed and updated as appropriate: allergies, current medications, past family history, past medical history, past social history, past surgical history and problem list.    Review of Systems   Constitutional: Negative.    Respiratory: Negative.    Cardiovascular: Negative.    Gastrointestinal: Negative.    Genitourinary: Negative.    Musculoskeletal: Negative.    Skin: Negative.    Neurological: Negative.    Hematological: Negative.    Psychiatric/Behavioral: Negative.            Objective:    Physical Exam  General: well nourished, no acute distress  Heent: normalocephalic  Neuro: alert and oriented times 3  Chest: she is breathing comfortably   Abdomen: soft, non tender, no masses  Breasts:The breasts are symmetric and there is a thickening in her left upper breast and there is no axillary lymphadenopathy or nipple discharge.  Pelvic: external genitalia: there are no masses or lesions, there is no discoloration  Vagina: normal rugae and no lesions or masses Cervix: the cervix is visually normal and there are no lesions or cervical motion tenderness Uterus:  The uterus is normal in size and non tender and not fixed or significantly deviated Adnexae: there are no masses, there is no tenderness and there is no fullness    I recommend she consider genetic counseling and testing and she would like to do that. I recommend a dx mammo and a referral to a breast specialist since she has a thickening of  her breast and to establish care and a regimen for breast care moving forward      Assessment:     Normal well woman exam with strong fhx breast cancer      Plan:      Procedures  Orders Placed This Encounter   Procedures    Mammo digital screening bilateral with CAD     Scheduling Instructions:      Please remind patient to bring MD order, referral or prescription with them on the day of the exam. If the order was faxed from the doctor's office to central scheduling please make sure the order is scanned into Epic.     Order Specific Question:   Select additional PRN/as needed imaging orders     Answer:   Tomosynthesis/3D mammogram     Order Specific Question:   Mammo Screening Order Type     Answer:   Ordered by Physician     Order Specific Question:   Release to patient     Answer:   Immediate     Order Specific Question:   Is the patient pregnant?     Answer:   No     Order Specific Question:   Reason for Exam:     Answer:   screening    Mammo Digital Breast Tomo  Dx Bilateral     Scheduling Instructions:      Please remind patient to bring MD order, referral or prescription with them on the day of the exam. If the order was faxed from the doctor's office to central scheduling please make sure the order is scanned into Epic.     Order Specific Question:   Select additional PRN/as needed imaging orders     Answer:   Diagnostic mammogram     Order Specific Question:   Select additional PRN/as needed imaging orders     Answer:   Breast ultrasound     Order Specific Question:   Select additional PRN/as needed imaging orders     Answer:   Tomosynthesis/3D mammogram     Order Specific Question:   Release to patient     Answer:   Immediate     Order Specific Question:   Is the patient pregnant?     Answer:   No     Order Specific Question:   Reason for Exam:     Answer:   thickening 3 cm upper left breast and breast cancer hx in mother and sister    Ambulatory referral to Genetics     Referral Priority:   Routine      Referral Type:   Consultation     Requested Specialty:   Clinical Genetics     Number of Visits Requested:   3    Breast Surgery Referral: Anner Crete, MD [facility])     Referral Priority:   Routine     Referral Type:   Consultation     Referral Reason:   Specialty Services Required     Referred to Provider:   Bronson Curb, MD     Requested Specialty:   Surgery     Number of Visits Requested:   6     pap hpv ( hpv non 16/18 post 2017 with last pap)   Menopausal transition symptoms discussed and all questions answered.

## 2019-11-07 LAB — IMAGE GUIDED PAP, COBAS, HPV 16/18
.: 0
HPV Genotype, 16: NEGATIVE
HPV Genotype, 18: NEGATIVE
HPV other hr types: NEGATIVE

## 2019-11-10 ENCOUNTER — Other Ambulatory Visit (INDEPENDENT_AMBULATORY_CARE_PROVIDER_SITE_OTHER): Payer: Self-pay | Admitting: Family Medicine

## 2019-11-10 DIAGNOSIS — D75839 Thrombocytosis, unspecified: Secondary | ICD-10-CM

## 2019-11-10 NOTE — Progress Notes (Signed)
Hi,    I have reviewed your labs and they are reassuring overall.  The platelet count however is increased and has been for the past few checks.  I think it is worth seeing a hematologist about this to be sure this is a benign issue.  You can go ahead and schedule at your convenience:    [facility]    Acquanetta Sit, MD  Marolyn Hammock. Alver Sorrow, MD, PhD  Joesph Fillers, MD  Manya Silvas, MD  Marko Plume, MD  Hennie Duos, MD  Monte Fantasia, MD  Jean-Paul Pinzon, DO  Alinda Money, MD  Divyang Brent Bulla, MD  Galen Manila, MD  Judieth Keens, MD  Burt Knack, MD  Pam Drown, MD  Isaiah Serge, MD  Tiney Rouge, MD  Robynn Pane, DO  Lyanne Co, MD  Laney Pastor, MD  Roque Lias, MD    [address]  [place], [place] [address]  Phone: [phone]    [address]  [place], [place] [address]  Phone: [phone]    ----------------------------------------------------------------------------------------------------------------    [facility]  Dr. Janna Arch   Dr. Fransisco Beau  Dr. Irene Shipper   Dr. Randol Kern   Dr. Chana Bode   Dr. Lurena Joiner   Dr. Felicity Coyer  Dr. Algis Downs    [address]  [place], [place] [address]   Phone: [phone]   Fax: [phone]     Dr. Charleston Ropes  Dr. Arther Abbott   Dr. Sallye Ober Denduluri   Dr. Lady Deutscher  Dr. Jimmie Molly   Dr. Laurian Brim    [address], [address]  [place], [place] [address]   Phone: [phone]   Fax: [phone]            Regards,    Antonieta Iba, MD  [facility]    [facility] - [facility] - [facility]  [address], [place], [place] [address]  Demetrius Charity [phone]     F [phone]

## 2019-11-15 ENCOUNTER — Other Ambulatory Visit: Payer: Self-pay | Admitting: Specialist

## 2019-11-15 ENCOUNTER — Telehealth (INDEPENDENT_AMBULATORY_CARE_PROVIDER_SITE_OTHER): Payer: Self-pay | Admitting: MS"

## 2019-11-15 NOTE — Telephone Encounter (Signed)
Kaleigh Spiegelman was referred for cancer genetic counseling by Dr. Providence Crosby on 11-03-19 due to a family history of cancer.    I spoke with Birdena Crandall and sent them an e-mail with a web link asking them to complete and return our required family history questionnaire.      Once the patient completes the questionnaire, one of our schedulers will call the patient back to schedule their appointment.    [facility]  Clinical Assistant  P [phone]  F [phone]  [email or web address]

## 2019-11-28 ENCOUNTER — Telehealth: Payer: Self-pay

## 2019-11-28 NOTE — Telephone Encounter (Signed)
WQ Referral: lvm for pt to return my call directly to be scheduled with Hematology

## 2019-11-29 ENCOUNTER — Telehealth: Payer: Self-pay

## 2019-11-29 NOTE — Telephone Encounter (Signed)
WQ Referral: 2nd attempt. lvm for pt to return my call directly to be scheduled with Hematology. Direct contact provided on vm.

## 2020-01-17 ENCOUNTER — Encounter (INDEPENDENT_AMBULATORY_CARE_PROVIDER_SITE_OTHER): Payer: Self-pay | Admitting: Family Medicine

## 2020-01-17 DIAGNOSIS — T753XXD Motion sickness, subsequent encounter: Secondary | ICD-10-CM

## 2020-01-18 MED ORDER — SCOPOLAMINE 1 MG/3DAYS TD PT72
1.00 | MEDICATED_PATCH | TRANSDERMAL | 0 refills | Status: DC
Start: 2020-01-18 — End: 2020-10-01

## 2020-01-18 NOTE — Telephone Encounter (Signed)
Forwarded to PCP.

## 2020-06-06 ENCOUNTER — Emergency Department (HOSPITAL_COMMUNITY)
Admission: EM | Admit: 2020-06-06 | Discharge: 2020-06-07 | Disposition: A | Discharge status: Home / Self Care | Payer: HRSA Program | Attending: Emergency Medicine | Admitting: Emergency Medicine

## 2020-06-06 ENCOUNTER — Other Ambulatory Visit: Payer: Self-pay

## 2020-06-06 DIAGNOSIS — R509 Fever, unspecified: Secondary | ICD-10-CM | POA: Diagnosis present

## 2020-06-06 DIAGNOSIS — Z87891 Personal history of nicotine dependence: Secondary | ICD-10-CM | POA: Diagnosis not present

## 2020-06-06 DIAGNOSIS — U071 COVID-19: Secondary | ICD-10-CM | POA: Insufficient documentation

## 2020-06-06 LAB — RESP PANEL BY RT-PCR (FLU A&B, COVID) ARPGX2
Influenza A by PCR: NEGATIVE
Influenza B by PCR: NEGATIVE
SARS Coronavirus 2 by RT PCR: POSITIVE — AB

## 2020-06-06 NOTE — ED Triage Notes (Signed)
Stated fever since Sunday, + exposure to covid in same household. - vaccination.

## 2020-06-07 NOTE — Discharge Instructions (Addendum)
You have tested positive for COVID 19  We are instructing patient's with COVID 19 or symptoms of COVID 19 to follow the below instructions regardless of vaccination status:  - Stay home for 5 days. - If you have no symptoms or your symptoms are resolving after 5 days, you can leave your house--> Continue to wear a mask around others for 5 additional days at all times - If you have a fever, continue to stay home until your fever resolves.  Please follow up with primary care within 3-5 days for re-evaluation- call prior to going to the office to make them aware of your symptoms as some offices are altering their method of seeing patients with COVID 19 symptoms, we have also provided our [place] covid clinic for follow up as well.  Return to the ER for new or worsening symptoms including but not limited to increased work of breathing, chest pain, passing out, or any other concerns.       Person Under Monitoring Name: Crystal Hernandez  Location: [address] [place] [address]   Infection Prevention Recommendations for Individuals Confirmed to have, or Being Evaluated for, 2019 Novel Coronavirus (COVID-19) Infection Who Receive Care at Home  Individuals who are confirmed to have, or are being evaluated for, COVID-19 should follow the prevention steps below until a healthcare provider or local or state health department says they can return to normal activities.  Stay home except to get medical care You should restrict activities outside your home, except for getting medical care. Do not go to work, school, or public areas, and do not use public transportation or taxis.  Call ahead before visiting your doctor Before your medical appointment, call the healthcare provider and tell them that you have, or are being evaluated for, COVID-19 infection. This will help the healthcare provider's office take steps to keep other people from getting infected. Ask your healthcare provider to call the  local or state health department.  Monitor your symptoms Seek prompt medical attention if your illness is worsening (e.g., difficulty breathing). Before going to your medical appointment, call the healthcare provider and tell them that you have, or are being evaluated for, COVID-19 infection. Ask your healthcare provider to call the local or state health department.  Wear a facemask You should wear a facemask that covers your nose and mouth when you are in the same room with other people and when you visit a healthcare provider. People who live with or visit you should also wear a facemask while they are in the same room with you.  Separate yourself from other people in your home As much as possible, you should stay in a different room from other people in your home. Also, you should use a separate bathroom, if available.  Avoid sharing household items You should not share dishes, drinking glasses, cups, eating utensils, towels, bedding, or other items with other people in your home. After using these items, you should wash them thoroughly with soap and water.  Cover your coughs and sneezes Cover your mouth and nose with a tissue when you cough or sneeze, or you can cough or sneeze into your sleeve. Throw used tissues in a lined trash can, and immediately wash your hands with soap and water for at least 20 seconds or use an alcohol-based hand rub.  Wash your [facility] your hands often and thoroughly with soap and water for at least 20 seconds. You can use an alcohol-based hand sanitizer if soap  and water are not available and if your hands are not visibly dirty. Avoid touching your eyes, nose, and mouth with unwashed hands.   Prevention Steps for Caregivers and Household Members of Individuals Confirmed to have, or Being Evaluated for, COVID-19 Infection Being Cared for in the Home  If you live with, or provide care at home for, a person confirmed to have, or being evaluated for,  COVID-19 infection please follow these guidelines to prevent infection:  Follow healthcare provider's instructions Make sure that you understand and can help the patient follow any healthcare provider instructions for all care.  Provide for the patient's basic needs You should help the patient with basic needs in the home and provide support for getting groceries, prescriptions, and other personal needs.  Monitor the patient's symptoms If they are getting sicker, call his or her medical provider and tell them that the patient has, or is being evaluated for, COVID-19 infection. This will help the healthcare provider's office take steps to keep other people from getting infected. Ask the healthcare provider to call the local or state health department.  Limit the number of people who have contact with the patient If possible, have only one caregiver for the patient. Other household members should stay in another home or place of residence. If this is not possible, they should stay in another room, or be separated from the patient as much as possible. Use a separate bathroom, if available. Restrict visitors who do not have an essential need to be in the home.  Keep older adults, very young children, and other sick people away from the patient Keep older adults, very young children, and those who have compromised immune systems or chronic health conditions away from the patient. This includes people with chronic heart, lung, or kidney conditions, diabetes, and cancer.  Ensure good ventilation Make sure that shared spaces in the home have good air flow, such as from an air conditioner or an opened window, weather permitting.  Wash your hands often Wash your hands often and thoroughly with soap and water for at least 20 seconds. You can use an alcohol based hand sanitizer if soap and water are not available and if your hands are not visibly dirty. Avoid touching your eyes, nose, and mouth  with unwashed hands. Use disposable paper towels to dry your hands. If not available, use dedicated cloth towels and replace them when they become wet.  Wear a facemask and gloves Wear a disposable facemask at all times in the room and gloves when you touch or have contact with the patient's blood, body fluids, and/or secretions or excretions, such as sweat, saliva, sputum, nasal mucus, vomit, urine, or feces.  Ensure the mask fits over your nose and mouth tightly, and do not touch it during use. Throw out disposable facemasks and gloves after using them. Do not reuse. Wash your hands immediately after removing your facemask and gloves. If your personal clothing becomes contaminated, carefully remove clothing and launder. Wash your hands after handling contaminated clothing. Place all used disposable facemasks, gloves, and other waste in a lined container before disposing them with other household waste. Remove gloves and wash your hands immediately after handling these items.  Do not share dishes, glasses, or other household items with the patient Avoid sharing household items. You should not share dishes, drinking glasses, cups, eating utensils, towels, bedding, or other items with a patient who is confirmed to have, or being evaluated for, COVID-19 infection. After the person uses  these items, you should wash them thoroughly with soap and water.  Wash laundry thoroughly Immediately remove and wash clothes or bedding that have blood, body fluids, and/or secretions or excretions, such as sweat, saliva, sputum, nasal mucus, vomit, urine, or feces, on them. Wear gloves when handling laundry from the patient. Read and follow directions on labels of laundry or clothing items and detergent. In general, wash and dry with the warmest temperatures recommended on the label.  Clean all areas the individual has used often Clean all touchable surfaces, such as counters, tabletops, doorknobs, bathroom  fixtures, toilets, phones, keyboards, tablets, and bedside tables, every day. Also, clean any surfaces that may have blood, body fluids, and/or secretions or excretions on them. Wear gloves when cleaning surfaces the patient has come in contact with. Use a diluted bleach solution (e.g., dilute bleach with 1 part bleach and 10 parts water) or a household disinfectant with a label that says EPA-registered for coronaviruses. To make a bleach solution at home, add 1 tablespoon of bleach to 1 quart (4 cups) of water. For a larger supply, add  cup of bleach to 1 gallon (16 cups) of water. Read labels of cleaning products and follow recommendations provided on product labels. Labels contain instructions for safe and effective use of the cleaning product including precautions you should take when applying the product, such as wearing gloves or eye protection and making sure you have good ventilation during use of the product. Remove gloves and wash hands immediately after cleaning.  Monitor yourself for signs and symptoms of illness Caregivers and household members are considered close contacts, should monitor their health, and will be asked to limit movement outside of the home to the extent possible. Follow the monitoring steps for close contacts listed on the symptom monitoring form.   ? If you have additional questions, contact your local health department or call the epidemiologist on call at [phone] (available 24/7). ? This guidance is subject to change. For the most up-to-date guidance from [facility], please refer to their website: [email or web address]

## 2020-06-07 NOTE — ED Provider Notes (Signed)
[facility] EMERGENCY DEPARTMENT Provider Note   CSN: [id number] Arrival date & time: 06/06/20  1658     History Chief Complaint  Patient presents with  . Fever    Crystal Hernandez is a 45 y.o. female who presents to the ED with complaints of fever 06/03/20 which has since resolved. Patient states she woke up with a fever on Sunday, took some ibuprofen, and woke up the next day feeling much better.  She states she has had some nasal congestion but this is baseline for her, no significant acute change.  No return of fever.  Other than ibuprofen no specific alleviating or aggravating factors.  Her daughter whom lives in the house recently tested positive for COVID-19.  She has not received a COVID-19 vaccine.  She denies chest pain, dyspnea, cough, vomiting, diarrhea, abdominal pain, or dysuria.  HPI     Past Medical History:  Diagnosis Date  . Abnormal Pap smear   . Depression   . 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 Infection    UTI  . Kidney abscess   . Seasonal allergies   . Vaginal Pap smear, abnormal     Patient Active Problem List   Diagnosis Date Noted  . Ovarian cyst 03/08/2019  . Overactive bladder 03/08/2019    Past Surgical History:  Procedure Laterality Date  . ANTERIOR CRUCIATE LIGAMENT REPAIR     rt  . COLPOSCOPY    . KNEE SURGERY    . TUBAL LIGATION    . urethral stint    . WISDOM TOOTH EXTRACTION       OB History    Gravida  3   Para  3   Term  3   Preterm      AB      Living  3     SAB      IAB      Ectopic      Multiple      Live Births              Family History  Problem Relation Age of Onset  . Cancer Father        20 00  . Anesthesia problems Neg Hx   . Hypotension Neg Hx   . Malignant hyperthermia Neg Hx   . Pseudochol deficiency Neg Hx     Social History   Tobacco Use  . Smoking status: Former Smoker    Packs/day: 0.50    Years: 26.00    Pack years: 13.00     Types: Cigarettes    Quit date: 06/07/2015    Years since quitting: 5.0  . Smokeless tobacco: Never Used  Substance Use Topics  . Alcohol use: Yes    Comment: "once a blue moon"  . Drug use: No    Home Medications Prior to Admission medications   Medication Sig Start Date End Date Taking? Authorizing Provider  acetaminophen (TYLENOL) 500 MG tablet Take 1,000 mg by mouth every 6 (six) hours as needed for moderate pain or headache.     [provider]  acidophilus (RISAQUAD) CAPS capsule Take 1 capsule by mouth daily.    [provider]  Multiple Vitamin (MULTIVITAMIN) tablet Take 1 tablet by mouth daily.    [provider]  vitamin C (ASCORBIC ACID) 500 MG tablet Take 500 mg by mouth daily.    [provider]    Allergies    Morphine  and related and Lactose intolerance (gi)  Review of Systems   Review of Systems  Constitutional: Positive for fever (Resolved at present).  HENT: Positive for congestion (Without acute change). Negative for ear pain and sore throat.   Respiratory: Negative for cough and shortness of breath.   Cardiovascular: Negative for chest pain and leg swelling.  Gastrointestinal: Negative for abdominal pain, diarrhea, nausea and vomiting.  Genitourinary: Negative for dysuria, frequency and urgency.  Neurological: Negative for syncope.    Physical Exam Updated Vital Signs BP 138/89 (BP Location: Right Arm)   Pulse 81   Temp 98.1 F (36.7 C) (Oral)   Resp 19   SpO2 100%   Physical Exam Vitals and nursing note reviewed.  Constitutional:      General: She is not in acute distress.    Appearance: She is well-developed. She is not toxic-appearing.  HENT:     Head: Normocephalic and atraumatic.     Nose:     Right Sinus: No maxillary sinus tenderness or frontal sinus tenderness.     Left Sinus: No maxillary sinus tenderness or frontal sinus tenderness.  Eyes:     General:        Right eye: No discharge.        Left  eye: No discharge.     Conjunctiva/sclera: Conjunctivae normal.  Cardiovascular:     Rate and Rhythm: Normal rate and regular rhythm.  Pulmonary:     Effort: Pulmonary effort is normal. No respiratory distress.     Breath sounds: Normal breath sounds. No wheezing, rhonchi or rales.  Abdominal:     General: There is no distension.     Palpations: Abdomen is soft.     Tenderness: There is no abdominal tenderness.  Musculoskeletal:     Cervical back: Neck supple.  Skin:    General: Skin is warm and dry.     Findings: No rash.  Neurological:     Mental Status: She is alert.     Comments: Clear speech.   Psychiatric:        Behavior: Behavior normal.     ED Results / Procedures / Treatments   Labs (all labs ordered are listed, but only abnormal results are displayed) Labs Reviewed  RESP PANEL BY RT-PCR (FLU A&B, COVID) ARPGX2 - Abnormal; Notable for the following components:      Result Value   SARS Coronavirus 2 by RT PCR POSITIVE (*)    All other components within normal limits    EKG None  Radiology No results found.  Procedures Procedures (including critical care time)  Medications Ordered in ED Medications - No data to display  ED Course  I have reviewed the triage vital signs and the nursing notes.  Pertinent labs & imaging results that were available during my care of the patient were reviewed by me and considered in my medical decision making (see chart for details).  Crystal Hernandez was evaluated in Emergency Department on 06/07/2020 for the symptoms described in the history of present illness. He/she was evaluated in the context of the global COVID-19 pandemic, which necessitated consideration that the patient might be at risk for infection with the SARS-CoV-2 virus that causes COVID-19. Institutional protocols and algorithms that pertain to the evaluation of patients at risk for COVID-19 are in a state of rapid change based on information released by  regulatory bodies including the CDC and federal and state organizations. These policies and algorithms were followed during the patient's care in  the ED.    MDM Rules/Calculators/A&P                          Patient presents to the emergency department with fever from 4 days ago which is now resolved.  She is nontoxic, resting comfortably, her vitals are without significant abnormality.  She specifically is afebrile in the ER.  She does report some congestion which is chronic for her, no acute change.  Her COVID-19 test is positive, this is likely the underlying etiology to her prior fever.  Her symptoms are fairly resolved at this point.  She is in not in respiratory distress, she has no evidence of hypoxia, breath sounds are clear.  No other symptoms to raise concern for alternative etiology at this time. Benign exam.  Overall appears appropriate for discharge.  We discussed isolation protocol per current CDC guidelines. I discussed results, treatment plan, need for follow-up, and return precautions with the patient. Provided opportunity for questions, patient confirmed understanding and is in agreement with plan.    Final Clinical Impression(s) / ED Diagnoses Final diagnoses:  [id number]    Rx / DC Orders ED Discharge Orders    None       Amaryllis Dyke, PA-C 06/07/20 0323    Maudie Flakes, MD 06/07/20 [phone]

## 2020-07-05 ENCOUNTER — Ambulatory Visit (INDEPENDENT_AMBULATORY_CARE_PROVIDER_SITE_OTHER): Payer: Commercial Managed Care - POS | Admitting: Obstetrics & Gynecology

## 2020-07-05 ENCOUNTER — Encounter (INDEPENDENT_AMBULATORY_CARE_PROVIDER_SITE_OTHER): Payer: Self-pay | Admitting: Obstetrics & Gynecology

## 2020-07-05 VITALS — BP 108/72 | HR 93 | Temp 98.3°F | Ht 64.0 in | Wt 115.0 lb

## 2020-07-05 DIAGNOSIS — L659 Nonscarring hair loss, unspecified: Secondary | ICD-10-CM

## 2020-07-05 DIAGNOSIS — N930 Postcoital and contact bleeding: Secondary | ICD-10-CM

## 2020-07-05 DIAGNOSIS — N907 Vulvar cyst: Secondary | ICD-10-CM

## 2020-07-05 DIAGNOSIS — Z113 Encounter for screening for infections with a predominantly sexual mode of transmission: Secondary | ICD-10-CM

## 2020-07-05 LAB — HEPATITIS B SURFACE ANTIGEN W/ REFLEX TO CONFIRMATION: Hepatitis B Surface Antigen: NONREACTIVE

## 2020-07-05 LAB — T4, FREE: T4 Free: 0.96 ng/dL (ref 0.70–1.48)

## 2020-07-05 LAB — TSH: TSH: 0.8 u[IU]/mL (ref 0.35–4.94)

## 2020-07-05 LAB — HEPATITIS C ANTIBODY: Hepatitis C, AB: NONREACTIVE

## 2020-07-05 LAB — T3, FREE: T3, Free: 3.14 pg/mL (ref 1.71–3.71)

## 2020-07-05 LAB — FOLLICLE STIMULATING HORMONE: Follicle Stimulating Hormone: 3.36

## 2020-07-05 LAB — HIV-1/2 AG/AB 4TH GEN. W/ REFLEX: HIV Ag/Ab, 4th Generation: NONREACTIVE

## 2020-07-05 LAB — ESTRADIOL: Estradiol: 128 pg/mL

## 2020-07-05 LAB — SYPHILIS SCREEN IGG AND IGM: Syphilis Screen IgG and IgM: NONREACTIVE

## 2020-07-05 NOTE — Progress Notes (Signed)
Return GYN Visit: [facility]    45 y.o. [phone] presenting for evaluation of post coital bleeding.  Also wants STI testing  Also has a vulvar bump  Also got a hemorrhoid last week  Reports some hair loss since had covid     Would be interested in HRT in future.   Still having regular periods  Strong family history breast cancer    Review of Systems   Constitutional: Negative for fever.   HENT: Negative for hearing loss.    Eyes: Negative for blurred vision.   Respiratory: Negative for cough.    Cardiovascular: Negative for chest pain.   Gastrointestinal: Negative for heartburn.   Genitourinary: Negative for dysuria.   Musculoskeletal: Negative for myalgias.   Skin: Negative for rash.   Neurological: Negative for dizziness.   Endo/Heme/Allergies: Does not bruise/bleed easily.   Psychiatric/Behavioral: Negative for depression.       Past medical, surgical, family, social, and obstetric history reviewed.    Meds:    Current/Home Medications    BUPROPION (WELLBUTRIN) 100 MG TABLET    Take 100 mg by mouth daily    CLOBETASOL (TEMOVATE) 0.05 % CREAM    APPLY TO AFFECTED AREA ON HAND TWICE A DAY FOR 14 DAYS THEN STOP.    ESZOPICLONE (LUNESTA) 1 MG TABLET    Take 1 tablet (1 mg total) by mouth nightly.Take immediately before bedtime    MULTIPLE VITAMIN (MULTIVITAMIN) CAPSULE    Take 1 capsule by mouth daily.    ONDANSETRON (ZOFRAN-ODT) 4 MG DISINTEGRATING TABLET    1 TAB ONTO THE TONGUE 4 TIMES PER DAY AS NEEDED FOR NAUSEA AND VOMITING FOR 5 DAY(S)    SCOPOLAMINE (TRANSDERM-SCOP)    Place 1 patch onto the skin every third day    VORTIOXETINE HBR (TRINTELLIX) 20 MG TAB    Take by mouth    VYVANSE 50 MG CAPSULE           Allergies:   Allergies   Allergen Reactions    Keflex [Cephalexin] Hives    Nitrofurantoin      AKA Macrobid    Percocet [Oxycodone-Acetaminophen] Hives    Sulfa Antibiotics Hives    Sulfamethoxazole-Trimethoprim        BP 108/72    Pulse 93    Temp 98.3 F (36.8 C)    Ht 5\' 4"  (1.626 m)    Wt  115 lb (52.2 kg)    LMP 06/11/2020 (Exact Date)    BMI 19.74 kg/m   Abd: Soft, NT/ND  Ext: No LE edema  SSE: 3 mm cyst left labia minora (inclusion cyst), normal vaginal and cervical mucosa with no lesions or ulcerations, no blood within the vaginal vault   SVE: Cervix long and closed, no cervical motion tenderness, no adnexal masses, no adnexal tenderness to palpation     A/P:  Labial cyst - normal, nothing to do.   Hair loss - check TFTs and FSH and EE (she wants a baseline)  Saw a doctor to discuss bioidentical HRT - not on anything, still having regular periods. Aware I can prescribe if she desires but doesn't need it at this time. Aware I only rx estrogen and progesterone, not testosterone  Post coital bleeding - only happened after rough sex and resolved and no blood on exam. Recommend lubricant use.   Wants sti testing  - done    25 minutes total time today    Bruce Donath, MD  [facility]

## 2020-07-06 LAB — CHLAMYDIA/NEISSERIA BY PCR
Chlamydia DNA by PCR: NEGATIVE
Neisseria gonorrhoeae by PCR: NEGATIVE

## 2020-08-23 ENCOUNTER — Ambulatory Visit (INDEPENDENT_AMBULATORY_CARE_PROVIDER_SITE_OTHER): Payer: Commercial Managed Care - POS | Admitting: Obstetrics & Gynecology

## 2020-08-23 ENCOUNTER — Encounter (INDEPENDENT_AMBULATORY_CARE_PROVIDER_SITE_OTHER): Payer: Self-pay | Admitting: Obstetrics & Gynecology

## 2020-08-23 VITALS — BP 126/79 | HR 92 | Temp 98.2°F | Wt 115.4 lb

## 2020-08-23 DIAGNOSIS — Z113 Encounter for screening for infections with a predominantly sexual mode of transmission: Secondary | ICD-10-CM

## 2020-08-23 LAB — HCG QUANTITATIVE: hCG, Quant.: <2.4 (ref 0.0–4.9)

## 2020-08-23 LAB — POCT PREGNANCY TEST, URINE HCG: POCT Pregnancy HCG Test, UR: NEGATIVE

## 2020-08-23 LAB — SYPHILIS SCREEN IGG AND IGM: Syphilis Screen IgG and IgM: NONREACTIVE

## 2020-08-23 LAB — HEPATITIS B SURFACE ANTIGEN W/ REFLEX TO CONFIRMATION: Hepatitis B Surface Antigen: NONREACTIVE

## 2020-08-23 LAB — HEPATITIS C ANTIBODY: Hepatitis C, AB: NONREACTIVE

## 2020-08-23 LAB — HIV-1/2 AG/AB 4TH GEN. W/ REFLEX: HIV Ag/Ab, 4th Generation: NONREACTIVE
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
## 2020-08-24 LAB — GENITAL CHLAMYDIA/NEISSERIA BY PCR
Chlamydia DNA by PCR: NEGATIVE
Neisseria gonorrhoeae by PCR: NEGATIVE

## 2020-08-27 ENCOUNTER — Encounter (INDEPENDENT_AMBULATORY_CARE_PROVIDER_SITE_OTHER): Payer: Self-pay | Admitting: Obstetrics & Gynecology

## 2020-09-27 ENCOUNTER — Ambulatory Visit (INDEPENDENT_AMBULATORY_CARE_PROVIDER_SITE_OTHER): Payer: Self-pay | Admitting: Obstetrics & Gynecology

## 2020-10-01 ENCOUNTER — Ambulatory Visit (INDEPENDENT_AMBULATORY_CARE_PROVIDER_SITE_OTHER): Payer: Commercial Managed Care - POS | Admitting: Family Medicine

## 2020-10-01 ENCOUNTER — Encounter (INDEPENDENT_AMBULATORY_CARE_PROVIDER_SITE_OTHER): Payer: Self-pay | Admitting: Family Medicine

## 2020-10-01 VITALS — BP 114/66 | HR 75 | Temp 98.1°F | Ht 64.7 in | Wt 115.8 lb

## 2020-10-01 DIAGNOSIS — R198 Other specified symptoms and signs involving the digestive system and abdomen: Secondary | ICD-10-CM | POA: Insufficient documentation

## 2020-10-01 DIAGNOSIS — H029 Unspecified disorder of eyelid: Secondary | ICD-10-CM

## 2020-10-01 DIAGNOSIS — Z01818 Encounter for other preprocedural examination: Secondary | ICD-10-CM

## 2020-10-01 LAB — CBC AND DIFFERENTIAL
Absolute NRBC: 0 10*3/uL (ref 0.00–0.00)
Basophils Absolute Automated: 0.05 10*3/uL (ref 0.00–0.08)
Basophils Automated: 0.7 %
Eosinophils Absolute Automated: 0.02 10*3/uL (ref 0.00–0.44)
Eosinophils Automated: 0.3 %
Hematocrit: 37.9 % (ref 34.7–43.7)
Hgb: 12.5 g/dL (ref 11.4–14.8)
Immature Granulocytes Absolute: 0.04 10*3/uL (ref 0.00–0.07)
Immature Granulocytes: 0.6 %
Lymphocytes Absolute Automated: 1.2 10*3/uL (ref 0.42–3.22)
Lymphocytes Automated: 16.7 %
MCH: 30.3 pg (ref 25.1–33.5)
MCHC: 33 g/dL (ref 31.5–35.8)
MCV: 92 fL (ref 78.0–96.0)
MPV: 9.3 fL (ref 8.9–12.5)
Monocytes Absolute Automated: 0.46 10*3/uL (ref 0.21–0.85)
Monocytes: 6.4 %
Neutrophils Absolute: 5.4 10*3/uL (ref 1.10–6.33)
Neutrophils: 75.3 %
Nucleated RBC: 0 /100 WBC (ref 0.0–0.0)
Platelets: 448 10*3/uL — ABNORMAL HIGH (ref 142–346)
RBC: 4.12 10*6/uL (ref 3.90–5.10)
RDW: 12 % (ref 11–15)
WBC: 7.17 10*3/uL (ref 3.10–9.50)

## 2020-10-01 LAB — COMPREHENSIVE METABOLIC PANEL
ALT: 15 U/L (ref 0–55)
AST (SGOT): 19 U/L (ref 5–34)
Albumin/Globulin Ratio: 1.3 (ref 0.9–2.2)
Albumin: 4 g/dL (ref 3.5–5.0)
Alkaline Phosphatase: 48 U/L (ref 37–117)
Anion Gap: 4 — ABNORMAL LOW (ref 5.0–15.0)
BUN: 17 mg/dL (ref 7.0–19.0)
Bilirubin, Total: 0.5 mg/dL (ref 0.2–1.2)
CO2: 30 mEq/L — ABNORMAL HIGH (ref 21–29)
Calcium: 9.3 mg/dL (ref 8.5–10.5)
Chloride: 101 mEq/L (ref 100–111)
Creatinine: 0.8 mg/dL (ref 0.4–1.5)
Globulin: 3.1 g/dL (ref 2.0–3.7)
Glucose: 101 mg/dL — ABNORMAL HIGH (ref 70–100)
Potassium: 4.5 mEq/L (ref 3.5–5.1)
Protein, Total: 7.1 g/dL (ref 6.0–8.3)
Sodium: 135 mEq/L — ABNORMAL LOW (ref 136–145)

## 2020-10-01 LAB — HEMOLYSIS INDEX: Hemolysis Index: 6 (ref 0–24)

## 2020-10-01 LAB — GFR: EGFR: >60

## 2020-10-01 NOTE — Progress Notes (Signed)
Subjective:     Patient ID:  Deborah Kirk is a 45 y.o. female.       Here today for preoperative evaluation.  She is scheduled for bilateral eyelid surgery with Dr. Rica Records in [place] on 10/09/2020 as an outpatient procedure.  Patient has no acute concerns.  She is in her usual sate of health.  has no history of myocardial infarction.      Current Outpatient Medications   Medication Sig   . buPROPion (WELLBUTRIN) 100 MG tablet Take 100 mg by mouth daily   . clobetasol propionate (CLOBEX) 0.05 % shampoo    . eszopiclone (LUNESTA) 1 MG tablet Take 1 tablet (1 mg total) by mouth nightly.Take immediately before bedtime   . ketoconazole (NIZORAL) 2 % shampoo    . Multiple Vitamin (MULTIVITAMIN) capsule Take 1 capsule by mouth daily.   Marland Kitchen spironolactone (ALDACTONE) 25 MG tablet Take 25 mg by mouth daily   . Trintellix 5 MG Tab tablet Take 2.5 mg by mouth daily   . Vortioxetine HBr (Trintellix) 20 MG Tab Take 20 mg by mouth daily   . Vyvanse 50 MG capsule Take 50 mg by mouth every morning         The following portions of the patient's history were reviewed and updated as appropriate: allergies, current medications, past medical history and problem list.      Review of Systems   Constitutional: Negative for chills, fatigue and fever.   Respiratory: Negative for cough, shortness of breath and wheezing.    Cardiovascular: Negative for chest pain and palpitations.   All other systems reviewed and are negative.      BP 114/66 (BP Site: Right arm, Patient Position: Sitting)   Pulse 75   Temp 98.1 F (36.7 C) (Tympanic)   Ht 1.643 m (5' 4.7")   Wt 52.5 kg (115 lb 12.8 oz)   LMP 09/02/2020 (Approximate)   BMI 19.45 kg/m      Objective:   Physical Exam  Vitals and nursing note reviewed.   Constitutional:       General: She is not in acute distress.     Appearance: She is well-developed.   Cardiovascular:      Rate and Rhythm: Normal rate and regular rhythm.      Heart sounds: Normal heart sounds. No murmur  heard.    No friction rub. No gallop.   Pulmonary:      Effort: Pulmonary effort is normal. No respiratory distress.      Breath sounds: Normal breath sounds. No wheezing or rales.   Musculoskeletal:      Right lower leg: No edema.      Left lower leg: No edema.            Assessment:       ICD-10-CM    1. Eyelid problem  H02.9 CBC and differential     Comprehensive metabolic panel   2. Preoperative examination  Z01.818 CBC and differential     Comprehensive metabolic panel       Plan:     1.2.  Based on the revised cardiac risk index the patient is a low risk candidate for low risk surgery (outpatient eyelid surgery) at this time.  The patient's creatinine is pending and could affect this risk.  I will addend this note upon receipt of results.        Risk & benefits of any medication(s) given/prescribed were explained to the patient (and/or family) who verbalized understanding &  agreed to the treatment plan.  Patient (and/or family) encouraged to contact me/clinical staff with any questions/concerns.      Procedures  :none    **Addendum**  Labs reassuring.  May proceed with surgery as scheduled.

## 2020-10-04 NOTE — Progress Notes (Signed)
PreOp Clearance Faxed as requested to [phone] [facility]

## 2020-10-04 NOTE — Progress Notes (Signed)
Hi,    I have reviewed your labs and they are reassuring.  We sent the clearance to your surgeon.      Regards,    Antonieta Iba, MD  Family Medicine/Geriatrics    [facility] - Primary Care - [facility]  [address], [place], [place] [address]  Demetrius Charity [phone]     F [phone]

## 2020-10-24 ENCOUNTER — Emergency Department
Admission: EM | Admit: 2020-10-24 | Discharge: 2020-10-24 | Disposition: A | Discharge status: Home / Self Care | Payer: Commercial Managed Care - POS | Attending: Emergency Medical Services | Admitting: Emergency Medical Services

## 2020-10-24 ENCOUNTER — Emergency Department: Payer: Commercial Managed Care - POS

## 2020-10-24 DIAGNOSIS — R079 Chest pain, unspecified: Secondary | ICD-10-CM | POA: Insufficient documentation

## 2020-10-24 DIAGNOSIS — R0602 Shortness of breath: Secondary | ICD-10-CM | POA: Insufficient documentation

## 2020-10-24 LAB — CBC AND DIFFERENTIAL
Absolute NRBC: 0 10*3/uL (ref 0.00–0.00)
Basophils Absolute Automated: 0.06 10*3/uL (ref 0.00–0.08)
Basophils Automated: 0.4 %
Eosinophils Absolute Automated: 0.05 10*3/uL (ref 0.00–0.44)
Eosinophils Automated: 0.4 %
Hematocrit: 39.8 % (ref 34.7–43.7)
Hgb: 13.7 g/dL (ref 11.4–14.8)
Immature Granulocytes Absolute: 0.05 10*3/uL (ref 0.00–0.07)
Immature Granulocytes: 0.4 %
Lymphocytes Absolute Automated: 1.73 10*3/uL (ref 0.42–3.22)
Lymphocytes Automated: 12.6 %
MCH: 30.4 pg (ref 25.1–33.5)
MCHC: 34.4 g/dL (ref 31.5–35.8)
MCV: 88.2 fL (ref 78.0–96.0)
MPV: 8.8 fL — ABNORMAL LOW (ref 8.9–12.5)
Monocytes Absolute Automated: 0.65 10*3/uL (ref 0.21–0.85)
Monocytes: 4.7 %
Neutrophils Absolute: 11.2 10*3/uL — ABNORMAL HIGH (ref 1.10–6.33)
Neutrophils: 81.5 %
Nucleated RBC: 0 /100 WBC (ref 0.0–0.0)
Platelets: 467 10*3/uL — ABNORMAL HIGH (ref 142–346)
RBC: 4.51 10*6/uL (ref 3.90–5.10)
RDW: 12 % (ref 11–15)
WBC: 13.74 10*3/uL — ABNORMAL HIGH (ref 3.10–9.50)

## 2020-10-24 LAB — BASIC METABOLIC PANEL
Anion Gap: 11 (ref 5.0–15.0)
BUN: 13 mg/dL (ref 7–19)
CO2: 25 mEq/L (ref 22–29)
Calcium: 9.7 mg/dL (ref 8.5–10.5)
Chloride: 103 mEq/L (ref 100–111)
Creatinine: 0.8 mg/dL (ref 0.6–1.0)
Glucose: 94 mg/dL (ref 70–100)
Potassium: 4 mEq/L (ref 3.5–5.1)
Sodium: 139 mEq/L (ref 136–145)

## 2020-10-24 LAB — IHS D-DIMER: D-Dimer: 0.34 ug/mL FEU (ref 0.00–0.60)

## 2020-10-24 LAB — HCG, SERUM, QUALITATIVE: Hcg Qualitative: NEGATIVE

## 2020-10-24 LAB — TROPONIN I: Troponin I: <0.01 ng/mL (ref 0.00–0.05)

## 2020-10-24 LAB — GFR: EGFR: >60

## 2020-10-24 NOTE — Discharge Instructions (Signed)
Dear Deborah Kirk,    You were seen today by Deborah Chalk, PA-C. Thank you for choosing the [facility] Emergency Department for your healthcare needs.  We hope your visit today was EXCELLENT.    Follow up with your doctor in 2-3 days for re-evaluation.  Return to the emergency department for any new or worsening symptoms.    If you have any questions or concerns, I am available at [phone]. Please do not hesitate to contact me if I can be of assistance.     Below is some information and resources that our patients often find helpful.    Sincerely,    Deborah Chalk, Physician Assistant  [facility] - Department of Emergency Medicine    ________________________________________________________________      Thank you for choosing [facility] for your emergency care needs.  We strive to provide EXCELLENT care to you and your family.      DOCTOR REFERRALS  Call [phone] if you need any further referrals and we can help you find a primary care doctor or specialist.  Also, available online at:  [email or web address]    YOUR CONTACT INFORMATION  Before leaving please check with registration to make sure we have an up-to-date contact number.  You can call registration at [phone] to update your information.  For questions about your hospital bill, please call [phone].  For questions about your Emergency Dept Physician bill please call [phone].      FREE HEALTH SERVICES  If you need help with health or social services, please call 2-1-1 for a free referral to resources in your area.  2-1-1 is a free service connecting people with information on health insurance, free clinics, pregnancy, mental health, dental care, food assistance, housing, and substance abuse counseling.  Also, available online at:  http://[email or web address].org    MEDICAL RECORDS AND TESTS  Certain laboratory test results do not come back the same day, for example urine  cultures.  We will contact you if other important findings are noted.  Radiology films are often reviewed again to ensure accuracy.  If there is any discrepancy, we will notify you.      Please call [phone] to pick up a complimentary CD of any radiology studies performed.  If you or your doctor would like to request a copy of your medical records, please call [phone].      ORTHOPEDIC INJURY   Please know that significant injuries can exist even when an initial x-ray is read as normal or negative.  This can occur because some fractures (broken bones) are not initially visible on x-rays.  For this reason, close outpatient follow-up with your primary care doctor or bone specialist (orthopedist) is required.    MEDICATIONS AND FOLLOWUP  Please be aware that some prescription medications can cause drowsiness.  Use caution when driving or operating machinery.    The examination and treatment you have received in our Emergency Department is provided on an emergency basis, and is not intended to be a substitute for your primary care physician.  It is important that your doctor checks you again and that you report any new or remaining problems at that time.      LOCAL PHARMACIES  [facility] - [address], [place], [place] [address] (1.4 miles, 7 minutes)  [facility] - [address], [place], [place] [address] (6.5 miles, 13 minutes)  Handout with directions available on request    PATIENT RELATIONS  If you have any concerns, issues, or feedback related to your care, positive or negative, please do not hesitate to contact Patient Relations at [phone]. They are open from 8:30AM-5:00PM Monday through Friday.

## 2020-10-24 NOTE — ED Triage Notes (Signed)
Chest tightness with inspiration onset yesterday, cough non-productive. Pt concerned for PE since 10 days bedrest s/p eye surgery. resp easy   No blood thinners

## 2020-10-25 ENCOUNTER — Telehealth (INDEPENDENT_AMBULATORY_CARE_PROVIDER_SITE_OTHER): Payer: Self-pay | Admitting: Family Medicine

## 2020-10-25 LAB — ECG 12-LEAD
Atrial Rate: 81 {beats}/min
IHS MUSE NARRATIVE AND IMPRESSION: NORMAL
P Axis: 79 degrees
P-R Interval: 142 ms
Q-T Interval: 374 ms
QRS Duration: 80 ms
QTC Calculation (Bezet): 434 ms
R Axis: 85 degrees
T Axis: 78 degrees
Ventricular Rate: 81 {beats}/min

## 2020-10-26 NOTE — ED Provider Notes (Signed)
[facility]       [facility]  EMERGENCY DEPARTMENT HISTORY AND PHYSICAL EXAM    Patient Information     Patient Name: Deborah Kirk, Deborah Kirk  Encounter Date:  10/24/2020  Patient DOB:  1975-10-12  MRN:  [id number]  Room:  15/B15  Rendering Provider: Delice Bison A. Jeannetta Nap, PA-C, CAQ-EM    History of Presenting Illness     Chief Complaint: chest pain, shortness of breath  Historian: pt  Onset: gradual, yesterday  Quality: tight, "hard to take a deep breath"  Duration: x24 hrs    HPI Comments:   45 y.o. female non-smoker c/o gradual onset of chest tightness and shortness of breath described as feeling like it was "hard to take a deep breath", x24 hrs. Pt states symptoms began yesterday. She's also had a little bit of a cough. No fever, hemoptysis, vomiting, abd pain, or LE edema. No recent long travel. No OCP use. No personal or family hx of DVT/PE. Of note, pt states on 10/09/20 she underwent eye surgery and spent about 9-10 days in bed resting thereafter.       PMD: Antonieta Iba, MD    Past Medical History     Past Medical History:   Diagnosis Date   . Anxiety    . Attention deficit hyperactivity disorder (ADHD)    . Vaginal yeast infection     chronic       Past Surgical History     Past Surgical History:   Procedure Laterality Date   . CESAREAN SECTION     . WISDOM TOOTH EXTRACTION         Family History     Family History   Problem Relation Age of Onset   . Sleep disorder Mother    . Anxiety disorder Mother    . Hepatitis Mother    . Cancer Mother         Breast   . Breast cancer Mother         dx in her 20   . Depression Mother    . Alzheimer's disease Father         82's   . Thyroid disease Sister    . Depression Sister    . Cancer Maternal Grandfather         Colon   . Cancer Paternal Grandfather         Colon   . Breast cancer Sister        Social History     Social History     Socioeconomic History   . Marital status: Divorced     Spouse name: Not on file   . Number of  children: Not on file   . Years of education: Not on file   . Highest education level: Not on file   Occupational History   . Not on file   Tobacco Use   . Smoking status: Never Smoker   . Smokeless tobacco: Never Used   Substance and Sexual Activity   . Alcohol use: No     Alcohol/week: 1.0 standard drink     Types: 1 Glasses of wine per week   . Drug use: No   . Sexual activity: Yes     Partners: Male     Birth control/protection: Condom     Comment: spouse vasectomy   Other Topics Concern   . Not on file   Social History Narrative   . 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 Keflex [Cephalexin] Hives   . Percocet [Oxycodone-Acetaminophen] Hives   . Sulfa Antibiotics Hives   . Sulfamethoxazole-Trimethoprim        Home Medications     Prior to Admission medications    Medication Sig Start Date End Date Taking? Authorizing Provider   buPROPion (WELLBUTRIN) 100 MG tablet Take 100 mg by mouth daily    [provider]   clobetasol propionate (CLOBEX) 0.05 % shampoo  07/26/20   [provider]   eszopiclone (LUNESTA) 1 MG tablet Take 1 tablet (1 mg total) by mouth nightly.Take immediately before bedtime 01/29/17   Santiago Glad, MD   ketoconazole (NIZORAL) 2 % shampoo  07/26/20   [provider]   Multiple Vitamin (MULTIVITAMIN) capsule Take 1 capsule by mouth daily.    [provider]   spironolactone (ALDACTONE) 25 MG tablet Take 25 mg by mouth daily 07/26/20   [provider]   Trintellix 5 MG Tab tablet Take 2.5 mg by mouth daily 09/28/20   [provider]   Vortioxetine HBr (Trintellix) 20 MG Tab Take 20 mg  by mouth daily    [provider]   Vyvanse 50 MG capsule Take 50 mg by mouth every morning 10/20/19   [provider]         Review of Systems     Review of Systems   Constitutional: Negative for fever.   Cardiovascular: Positive for chest pain.   Respiratory: Positive for cough and shortness of breath.   Gastrointestinal: Negative for vomiting and abd pain.  Musculoskeletal: +RLS. No LE edema.    All other systems reviewed and are negative.      Physical Exam     Physical Exam   Nursing note and vitals reviewed.   Constitutional: Pt is oriented to person, place, and time. Pt appears well-developed and well-nourished. Afebrile. Comfortable. No distress.   HENT:     Head: Normocephalic and atraumatic.     Right Ear: External ear normal.     Left Ear: External ear normal.     Mouth/Throat: Normal speech. (Wearing a mask).  Eyes: Conjunctivae normal. No drainage. EOMI.  Neck: Normal range of motion.   Cardiovascular: Regular rate and rhythm. No murmur.    Pulmonary/Chest: Vesicular breath sounds throughout. No respiratory distress. O2 sat 100% on RA.   Abdominal: Bowel sounds are normal.   Pelvic/GU: n/a  MSK: No LE edema. No calf tenderness. Negative Homan's sign BLE.  Neurological: Pt is alert and oriented to person, place, and time. GCS 15.    Skin: Skin is warm and dry. Normal skin turgor.   Psychiatric: Normal mood and affect. Behavior is normal.         Orders Placed During This Encounter     Orders Placed This Encounter   Procedures   . Chest AP Portable   . Basic Metabolic Panel   . CBC and differential   . Troponin I   . Beta HCG,  Qual, Serum   . D-Dimer   . GFR   . Cardiac monitoring (ED ONLY)   . ECG 12 Lead   . ECG 12 lead   . Saline lock IV       ED Medications Administered     ED Medication Orders (From admission, onward)    None          Diagnostic Study Results and Data Review     The results of the diagnostic studies below were reviewed by the ED provider:    Labs  Results      Procedure Component Value Units Date/Time    Troponin I [[id number]] Collected: 10/24/20 1234    Specimen: Blood Updated: 10/24/20 1311     Troponin I <0.01 ng/mL     Basic Metabolic Panel [[id number]] Collected: 10/24/20 1234    Specimen: Blood Updated: 10/24/20 1303     Glucose 94 mg/dL      BUN 13 mg/dL      Creatinine 0.8 mg/dL      Calcium 9.7 mg/dL      Sodium 875 mEq/L      Potassium 4.0 mEq/L      Chloride 103 mEq/L      CO2 25 mEq/L      Anion Gap 11.0    GFR [[id number]] Collected: 10/24/20 1234     Updated: 10/24/20 1303     EGFR >60.0    Beta HCG, Qual, Serum [[id number]] Collected: 10/24/20 1234    Specimen: Blood Updated: 10/24/20 1255     Hcg Qualitative Negative    D-Dimer [[id number]] Collected: 10/24/20 1234     Updated: 10/24/20 1251     D-Dimer 0.34 ug/mL FEU     CBC and differential [[id number]]  (Abnormal) Collected: 10/24/20 1234    Specimen: Blood Updated: 10/24/20 1243     WBC 13.74 x10 3/uL      Hgb 13.7 g/dL      Hematocrit 42.7 %      Platelets 467 x10 3/uL      RBC 4.51 x10 6/uL      MCV 88.2 fL      MCH 30.4 pg      MCHC 34.4 g/dL      RDW 12 %      MPV 8.8 fL      Neutrophils 81.5 %      Lymphocytes Automated 12.6 %      Monocytes 4.7 %      Eosinophils Automated 0.4 %      Basophils Automated 0.4 %      Immature Granulocytes 0.4 %      Nucleated RBC 0.0 /100 WBC      Neutrophils Absolute 11.20 x10 3/uL      Lymphocytes Absolute Automated 1.73 x10 3/uL      Monocytes Absolute Automated 0.65 x10 3/uL      Eosinophils Absolute Automated 0.05 x10 3/uL      Basophils Absolute Automated 0.06 x10 3/uL      Immature Granulocytes Absolute 0.05 x10 3/uL      Absolute NRBC 0.00 x10 3/uL           Radiologic Studies  Radiology Results (24 Hour)     Procedure Component Value Units Date/Time    Chest AP Portable [[id number]] Collected: 10/24/20 1258    Order Status: Completed Updated: 10/24/20 1304    Narrative:      HISTORY: Chest pain.    COMPARISON: None    PORTABLE CHEST:  FINDINGS: Monitoring  leads.    The heart is normal in size. The pulmonary vascular pattern is  unremarkable.  Left basilar atelectasis. There is no effusion. There is  no pneumothorax.       Impression:        Left basilar atelectasis       Geanie Cooley, MD   10/24/2020 1:02 PM            Abnormal results/incidental findings discussed with pt and/or family: yes    Monitors, EKG, Procedures, Critical Care, and Splints     EKG: NSR at 81 bpm. Normal axis. No acute ST/T wave changes. EKG reviewed w/ MD Malen Gauze.    MDM and Clinical Notes     Working Differential (not completely inclusive): viral syndrome, COVID-19, pneumonia, PE, ACS/MI, arrhythmia, etc    Nursing records reviewed and agree: Yes      Clinical Notes: History and exam as above. On exam, pt comfortable and well-appearing. Afebrile. Not tachycardic. Heart RRR. No m/r/g. Breath sounds clear. Normal work of `breathing. Not hypoxic. No LE edema or signs of fluid overload. Will plan to check basic labs, trop, ddimer, CXR and EKG. Pt in agreement. Pt declined COVID testing    Wells Score    Flowsheet Row Value   Previous DVT or PE 0   HR greater than 100 0   Surgery within 4 weeks, or Immobilization in last 3 days 1.5   Clinical Signs/Symptoms of DVT 0   Alternative diagnosis less likely than PE 0   Hemoptysis 0   Malignancy with treatment within 6 months or palliative care 0   Wells Score for PE 1.5      PERC Score    Flowsheet Row Value   Age (read only) less than or equal to 50 years   HR >= 100 0   O2 Sat on Room Air < 95% 0   Prior History of Venous Thromboembolism 0   Trauma or Surgery within 4 weeks 1   Hemoptysis 0   Exogenous Estrogen 0   Unilateral Leg Swelling 0   PERC Rule Score (Calculated) 1      Heart Score    Flowsheet Row Value   History 1   EKG 0   Risk Factors 0   Total (with age) 2   Onset of pain (time of START of last episode of chest pain)? >6 hrs ago        White count 13k  H/H wnl  Platelets mildly elevated, not significantly changed from prior  Cr  wnl  Ddimer neg in this pt with low pre-test prob for PE  Trop neg x1 in this pt with CP on-going for >6 hrs; no indication for serial enzymes    CXR shows left basilar atelectasis     Re-Eval: Re-eval prior to discharge - Pt comfortable. Excited to be discharged so she can attend her son's play.    All results r/w pt. Stable for discharge. Workup overall reassuring. Encouraged to take deeps breaths. Symptoms could be in part from her atelectasis. Encouraged f/u with PMD. CP precautions reviewed as well as reasons for return.    Personal Protective Equipment:  I was wearing appropriate personal protective equipment at the time of patient evaluation and for all face-to-face interactions:     Pandemic Caveat:  The patient was seen and evaluated during the time of COVID pandemic.  Significant limitations can be present in the evaluation and management of emergency department patients during  pandemic conditions, including but not limited to lack of testing, rapidly changing IHS protocols, and/or limited follow-up resources.         Prescriptions       Discharge Medication List as of 10/24/2020  1:13 PM          Diagnosis and Disposition     Clinical Impression:  1. Chest pain, unspecified type    2. Shortness of breath      Final diagnoses:   Chest pain, unspecified type   Shortness of breath       Disposition:  ED Disposition     ED Disposition   Discharge    Condition   --    Date/Time   Wed Oct 24, 2020  1:14 PM    Comment   Corissa Oguinn discharge to home/self care.    Condition at disposition: Stable                     Rendering Provider: Cyndy Freeze. Jeannetta Nap, PA-C, CAQ-EM    Attending's signature signifies review of the provider note and clinical impression.      This note was generated by the [facility] EMR system/Dragon speech recognition and may contain inherent errors or omissions not intended by the user. Grammatical errors, random word insertions, deletions, pronoun errors and incomplete sentences are occasional  consequences of this technology due to software limitations. Not all errors are caught or corrected. If there are questions or concerns about the content of this note or information contained within the body of this dictation they should be addressed directly with the author for clarification.         Nigel Sloop, [place]  10/26/20 1153       Harden Mo, MD  10/26/20 1258

## 2020-12-10 ENCOUNTER — Ambulatory Visit (INDEPENDENT_AMBULATORY_CARE_PROVIDER_SITE_OTHER): Payer: Commercial Managed Care - POS | Admitting: Obstetrics and Gynecology

## 2020-12-10 ENCOUNTER — Encounter (INDEPENDENT_AMBULATORY_CARE_PROVIDER_SITE_OTHER): Payer: Self-pay | Admitting: Obstetrics and Gynecology

## 2020-12-10 VITALS — BP 132/84 | HR 87 | Temp 98.0°F | Wt 116.2 lb

## 2020-12-10 DIAGNOSIS — Z113 Encounter for screening for infections with a predominantly sexual mode of transmission: Secondary | ICD-10-CM

## 2020-12-10 NOTE — Progress Notes (Signed)
Subjective:       Patient ID: Deborah Kirk is a 45 y.o. female.    HPI  45 yo not on birth control   Had unprotected sex on Saturday   Would like to have STD screen   Inquiring about plan b  Would like non hormonal LARC     The following portions of the patient's history were reviewed and updated as appropriate: allergies, current medications, past family history, past medical history, past social history, past surgical history, and problem list.    Review of Systems        Objective:    Physical Exam  Normal appearing cervix  No vaginal discharge noted  Normal appearing urethra and vulva and vagina   No cervical motion tenderness noted   No uterine tenderness noted   No adnexal masses were felt         Assessment:       45 yo for STD screen       Plan:      Procedures  Orders Placed This Encounter   Procedures    Genital Chlamydia/Neisseria BY PCR     Order Specific Question:   Source:     Answer:   Endocervix Swab     Order Specific Question:   Release to patient     Answer:   Immediate       Swab done   Advised to take Plan B   Discussed contraceptive measures including barrier methods (condoms), oral hormonal contraceptives (OCPs), injection methods (Depo Provera), transdermal methods (patch), intrauterine devices (Mirena/Paraguard), nexplanon  Would like to have the copper IUD   Will follow

## 2020-12-11 LAB — CHLAMYDIA/NEISSERIA BY PCR
Chlamydia DNA by PCR: NEGATIVE
Neisseria gonorrhoeae by PCR: NEGATIVE

## 2020-12-12 ENCOUNTER — Telehealth (INDEPENDENT_AMBULATORY_CARE_PROVIDER_SITE_OTHER): Payer: Self-pay

## 2020-12-12 NOTE — Telephone Encounter (Signed)
Pt returning call. Not sure what the call was about. No message left. Verbalized understanding.

## 2021-01-08 ENCOUNTER — Ambulatory Visit (INDEPENDENT_AMBULATORY_CARE_PROVIDER_SITE_OTHER): Payer: Commercial Managed Care - POS | Admitting: Obstetrics & Gynecology

## 2021-01-08 ENCOUNTER — Encounter (INDEPENDENT_AMBULATORY_CARE_PROVIDER_SITE_OTHER): Payer: Self-pay | Admitting: Obstetrics & Gynecology

## 2021-01-08 VITALS — BP 109/73 | HR 93 | Temp 98.3°F | Wt 117.6 lb

## 2021-01-08 DIAGNOSIS — Z30430 Encounter for insertion of intrauterine contraceptive device: Secondary | ICD-10-CM

## 2021-01-08 MED ORDER — PARAGARD INTRAUTERINE COPPER IU IUD
1.0000 | INTRAUTERINE_SYSTEM | Freq: Once | INTRAUTERINE | Status: AC
Start: 2021-01-08 — End: 2021-01-08
  Administered 2021-01-08: 15:00:00 1 via INTRAUTERINE

## 2021-01-08 NOTE — Progress Notes (Signed)
UNIVERSAL PROTOCOL:  TIMEOUT    Procedure: IUD insertion Paragard Date: 01/08/2021   All correct equipment/supplies are present  And ready for use prior to the procedure YES   Patient stated name and date of birth YES   Patient verbally stated the procedure (including the site and side) to be completed. YES   Informed Consent reviewed and signed consistent with procedure, side, and site information YES   Practitioner (MD/DO/DPM/NP/PA/RN) performing the procedure marked the site as indicated YES   Asked the patient for any known drug allergies, including anesthetics and latex. YES   Labels for specimens are prepared with the following information:  Date specimen collected  Name of patient  MR#  Provider name  Specimen type  and placed on containers in presence of patient YES   Verified that patient has had all questions answered. YES   Method of notification of biopsy results requested by patient      UPT: Negative YES           INSTRUMENTS USED DURING PROCEDURE:     Allis sterilized on 01/01/21 in load F1    Cup sterilized on 10/21 in load B2    Long Scissors sterilized on 01/01/21 in load F1

## 2021-01-08 NOTE — Progress Notes (Signed)
IUD insertion note    After reviewing risks, benefits, and alternatives, patient opted to proceed with insertion of paragard IUD. Consent was signed.  Urine pregnancy test was negative.  A speculum was placed and the cervix was prepped with betadine.  The anterior cervical lip was grasped with an allis.  The uterus sounded to 10 cm.  The Paragard IUD was loaded and placed in the standard fashion.  Strings were trimmed to 3 cm.  The allis was removed from the cervix, which was hemostatic.  The speculum was removed.  The patient tolerated the procedure well.    Bruce Donath, MD  [facility]

## 2021-01-10 ENCOUNTER — Ambulatory Visit (INDEPENDENT_AMBULATORY_CARE_PROVIDER_SITE_OTHER): Payer: Commercial Managed Care - POS | Admitting: Registered Nurse

## 2021-01-10 VITALS — BP 116/78 | HR 94 | Temp 98.1°F | Wt 114.2 lb

## 2021-01-10 DIAGNOSIS — Z30431 Encounter for routine checking of intrauterine contraceptive device: Secondary | ICD-10-CM

## 2021-01-10 NOTE — Progress Notes (Signed)
Acute GYN Visit    Chief Complaint: Gynecologic Exam (String check )      History of Present Illness: Deborah Kirk is a 45 y.o. female, [id number], LMP 12/21/20 (exact date) presenting for IUD string check. She had Paragard IUD placed by Dr. Gasper Sells 2 days ago. She was trying to check her strings at home, but was concerned that she couldn't feel them. Denies any cramping or bleeding.     Past Medical History:  Past Medical History:   Diagnosis Date    Anxiety     Attention deficit hyperactivity disorder (ADHD)     Vaginal yeast infection     chronic           Past Surgical History:  Past Surgical History:   Procedure Laterality Date    CESAREAN SECTION      WISDOM TOOTH EXTRACTION         Social History:  Social History     Socioeconomic History    Marital status: Divorced   Tobacco Use    Smoking status: Never    Smokeless tobacco: Never   Substance and Sexual Activity    Alcohol use: No     Alcohol/week: 1.0 standard drink     Types: 1 Glasses of wine per week    Drug use: No    Sexual activity: Yes     Partners: Male     Birth control/protection: Condom     Comment: spouse vasectomy       Family History:  Family History   Problem Relation Age of Onset    Sleep disorder Mother     Anxiety disorder Mother     Hepatitis Mother     Cancer Mother         Breast    Breast cancer Mother         dx in her 46    Depression Mother     Alzheimer's disease Father         72's    Thyroid disease Sister     Depression Sister     Cancer Maternal Grandfather         Colon    Cancer Paternal Grandfather         Colon    Breast cancer Sister        Allergies:   Allergies   Allergen Reactions    Keflex [Cephalexin] Hives    Percocet [Oxycodone-Acetaminophen] Hives    Sulfa Antibiotics Hives    Sulfamethoxazole-Trimethoprim        Medications:   Current Outpatient Medications   Medication Sig Dispense Refill    buPROPion (WELLBUTRIN) 100 MG tablet Take 100 mg by mouth daily      clobetasol propionate (CLOBEX) 0.05 % shampoo        eszopiclone (LUNESTA) 1 MG tablet Take 1 tablet (1 mg total) by mouth nightly.Take immediately before bedtime 90 tablet 3    ketoconazole (NIZORAL) 2 % shampoo       Multiple Vitamin (MULTIVITAMIN) capsule Take 1 capsule by mouth daily.      spironolactone (ALDACTONE) 25 MG tablet Take 25 mg by mouth daily      Trintellix 5 MG Tab tablet Take 2.5 mg by mouth daily      Vortioxetine HBr (Trintellix) 20 MG Tab Take 20 mg by mouth daily      Vyvanse 50 MG capsule Take 50 mg by mouth every morning       No current facility-administered medications for  this visit.       Review of Systems:  General ROS: negative for fatigue, fever/chills, weight loss  Breast ROS: negative for breast lumps, nipple discharge  Gastrointestinal ROS: no abdominal pain, change in bowel habits  Genitourinary ROS: no dysuria, trouble voiding, or hematuria  Skin: denies rash or lesion  Psych: denies depression  All other systems reviewed and are negative     Physical Exam:   BP 116/78 (BP Site: Left arm, Patient Position: Sitting, Cuff Size: Medium)   Pulse 94   Temp 98.1 F (36.7 C) (Temporal)   Wt 114 lb 3.2 oz (51.8 kg)   LMP 12/21/2020 (Exact Date)   BMI 19.18 kg/m   General appearance: alert, well appearing, and in no distress  Cardiovascular: regular rate and rhythm. No murmurs.  Respiratory: clear to auscultation bilaterally  Abdomen: soft, nontender, nondistended, no masses  Extremities: no signs of clubbing or edema   Pelvic exam:  VULVA: normal appearing vulva with no masses, tenderness or lesions, normal clitoris  VAGINA: normal appearing vagina with normal color and discharge, no lesions  CERVIX: normal appearing cervix without discharge or lesions, IUD strings visualized  UTERUS: uterus is normal size, shape, consistency and nontender  ADNEXA:  nontender and no masses.  RECTAL: normal external, no hemorrhoids    Assessment:  1. IUD check up        Plan:  - Reassured patient that IUD is in place  - Discussed with patient that  strings are not always felt on self exam  - All questions answered    - Follow up in 1 month for IUD check    Lucretia Field, DNP, FNP-BC  [facility]

## 2021-01-11 ENCOUNTER — Ambulatory Visit (INDEPENDENT_AMBULATORY_CARE_PROVIDER_SITE_OTHER): Payer: Commercial Managed Care - POS | Admitting: Obstetrics and Gynecology

## 2021-01-24 ENCOUNTER — Encounter (INDEPENDENT_AMBULATORY_CARE_PROVIDER_SITE_OTHER): Payer: Self-pay | Admitting: Registered Nurse

## 2021-01-24 ENCOUNTER — Ambulatory Visit (INDEPENDENT_AMBULATORY_CARE_PROVIDER_SITE_OTHER): Payer: Commercial Managed Care - POS | Admitting: Registered Nurse

## 2021-01-24 VITALS — BP 119/78 | HR 101 | Temp 97.5°F | Wt 115.2 lb

## 2021-01-24 DIAGNOSIS — Z30431 Encounter for routine checking of intrauterine contraceptive device: Secondary | ICD-10-CM

## 2021-01-24 LAB — POTASSIUM: Potassium: 4.8 mEq/L (ref 3.5–5.1)

## 2021-01-24 LAB — HEMOLYSIS INDEX: Hemolysis Index: 3 Index (ref 0–24)

## 2021-01-24 NOTE — Progress Notes (Signed)
Acute GYN Visit    Chief Complaint: Gynecologic Exam (IUD String check. Would like STI Screening )      History of Present Illness: Deborah Kirk is a 45 y.o. female, [id number], LMP 01/11/21 (approximate date) presenting for IUD string check. She had Paragard IUD placed by Dr. Gasper Sells 8/9. Denies any pelvic pain or abnormal bleeding. She also started a new relationship and wants to talk about STI screening.     Past Medical History:  Past Medical History:   Diagnosis Date    Anxiety     Attention deficit hyperactivity disorder (ADHD)     Vaginal yeast infection     chronic           Past Surgical History:  Past Surgical History:   Procedure Laterality Date    CESAREAN SECTION      WISDOM TOOTH EXTRACTION         Social History:  Social History     Socioeconomic History    Marital status: Divorced   Tobacco Use    Smoking status: Never    Smokeless tobacco: Never   Substance and Sexual Activity    Alcohol use: No     Alcohol/week: 1.0 standard drink     Types: 1 Glasses of wine per week    Drug use: No    Sexual activity: Yes     Partners: Male     Birth control/protection: Condom, I.U.D.     Comment: spouse vasectomy       Family History:  Family History   Problem Relation Age of Onset    Sleep disorder Mother     Anxiety disorder Mother     Hepatitis Mother     Cancer Mother         Breast    Breast cancer Mother         dx in her 20    Depression Mother     Alzheimer's disease Father         92's    Thyroid disease Sister     Depression Sister     Cancer Maternal Grandfather         Colon    Cancer Paternal Grandfather         Colon    Breast cancer Sister        Allergies:   Allergies   Allergen Reactions    Percocet [Oxycodone-Acetaminophen] Hives    Sulfa Antibiotics Hives    Sulfamethoxazole-Trimethoprim        Medications:   Current Outpatient Medications   Medication Sig Dispense Refill    buPROPion (WELLBUTRIN) 100 MG tablet Take 100 mg by mouth daily      eszopiclone (LUNESTA) 1 MG tablet Take 1  tablet (1 mg total) by mouth nightly.Take immediately before bedtime 90 tablet 3    Multiple Vitamin (MULTIVITAMIN) capsule Take 1 capsule by mouth daily.      spironolactone (ALDACTONE) 25 MG tablet Take 25 mg by mouth daily      Trintellix 5 MG Tab tablet Take 2.5 mg by mouth daily      Vortioxetine HBr (Trintellix) 20 MG Tab Take 20 mg by mouth daily      Vyvanse 50 MG capsule Take 50 mg by mouth every morning      clobetasol propionate (CLOBEX) 0.05 % shampoo       ketoconazole (NIZORAL) 2 % shampoo        No current facility-administered medications for this visit.  Review of Systems:  General ROS: negative for fatigue, fever/chills, weight loss  Breast ROS: negative for breast lumps, nipple discharge  Gastrointestinal ROS: no abdominal pain, change in bowel habits  Genitourinary ROS: no dysuria, trouble voiding, or hematuria  Skin: denies rash or lesion  Psych: denies depression  All other systems reviewed and are negative     Physical Exam:   BP 119/78 (BP Site: Left arm, Patient Position: Sitting, Cuff Size: Medium)   Pulse (!) 101   Temp 97.5 F (36.4 C) (Temporal)   Wt 115 lb 3.2 oz (52.3 kg)   LMP 01/11/2021 (Approximate)   BMI 19.35 kg/m   General appearance: alert, well appearing, and in no distress  Cardiovascular: regular rate and rhythm. No murmurs.  Respiratory: clear to auscultation bilaterally  Abdomen: soft, nontender, nondistended, no masses  Extremities: no signs of clubbing or edema   Pelvic exam:  VULVA: normal appearing vulva with no masses, tenderness or lesions, normal clitoris  VAGINA: normal appearing vagina with normal color and discharge, no lesions  CERVIX: normal appearing cervix without discharge or lesions, IUD strings visualized  UTERUS: uterus is normal size, shape, consistency and nontender  ADNEXA:  nontender and no masses.  RECTAL: normal external, no hemorrhoids    Assessment:  1. IUD check up        Plan:  - Reassured patient that IUD is in place  - Patient had  STD testing in March, and is unsure if insurance will cover repeat testing. Advised patient she can call her insurance to verify, and can RTO for walk-in labs if desired.  - RTO in 1 year for annual exam    Lucretia Field, DNP, FNP-BC  [facility]

## 2021-02-07 ENCOUNTER — Ambulatory Visit (INDEPENDENT_AMBULATORY_CARE_PROVIDER_SITE_OTHER): Payer: Commercial Managed Care - POS | Admitting: Registered Nurse

## 2021-02-11 ENCOUNTER — Ambulatory Visit (INDEPENDENT_AMBULATORY_CARE_PROVIDER_SITE_OTHER): Payer: Commercial Managed Care - POS | Admitting: Obstetrics and Gynecology

## 2021-04-01 ENCOUNTER — Ambulatory Visit (INDEPENDENT_AMBULATORY_CARE_PROVIDER_SITE_OTHER): Payer: Commercial Managed Care - POS | Admitting: Registered Nurse

## 2021-04-01 VITALS — BP 132/79 | HR 80 | Temp 97.8°F | Wt 119.2 lb

## 2021-04-01 DIAGNOSIS — N760 Acute vaginitis: Secondary | ICD-10-CM

## 2021-04-01 DIAGNOSIS — Z1231 Encounter for screening mammogram for malignant neoplasm of breast: Secondary | ICD-10-CM

## 2021-04-01 MED ORDER — FLUCONAZOLE 150 MG PO TABS
150.0000 mg | ORAL_TABLET | ORAL | 1 refills | Status: DC | PRN
Start: 2021-04-01 — End: 2021-09-23

## 2021-04-01 NOTE — Progress Notes (Signed)
Return GYN Visit    Chief Complaint: Gynecologic Exam (Pt was sick then she started noticing pressure and concentrated urine a week ago. )      History of Present Illness: Deborah Kirk is a 45 y.o. female, [id number], LMP 03/07/21 (approximate), presenting with pelvic pressure and vaginal irritation. She had a viral illness about 2 weeks ago and as she was starting to recover last week, she felt lower abdominal and pelvic pressure and noticed concentrated urine. She thought she could have a UTI. She had an old prescription of Augmentin so self-treated with 10 days of augmentin. She is still feeling pelvic pressure and on Friday, she felt fatigued and couldn't get out of bed. She also complains of abnormal vaginal discharge.    Past Medical History:  Past Medical History:   Diagnosis Date    Anxiety     Attention deficit hyperactivity disorder (ADHD)     Vaginal yeast infection     chronic           Past Surgical History:  Past Surgical History:   Procedure Laterality Date    CESAREAN SECTION      WISDOM TOOTH EXTRACTION         Social History:  Social History     Socioeconomic History    Marital status: Divorced   Tobacco Use    Smoking status: Never    Smokeless tobacco: Never   Substance and Sexual Activity    Alcohol use: No     Alcohol/week: 1.0 standard drink     Types: 1 Glasses of wine per week    Drug use: No    Sexual activity: Yes     Partners: Male     Birth control/protection: Condom, I.U.D.     Comment: spouse vasectomy       Family History:  Family History   Problem Relation Age of Onset    Sleep disorder Mother     Anxiety disorder Mother     Hepatitis Mother     Cancer Mother         Breast    Breast cancer Mother         dx in her 73    Depression Mother     Alzheimer's disease Father         17's    Thyroid disease Sister     Depression Sister     Cancer Maternal Grandfather         Colon    Cancer Paternal Grandfather         Colon    Breast cancer Sister        Allergies:   Allergies    Allergen Reactions    Percocet [Oxycodone-Acetaminophen] Hives    Sulfa Antibiotics Hives    Sulfamethoxazole-Trimethoprim        Medications:   Current Outpatient Medications   Medication Sig Dispense Refill    buPROPion (WELLBUTRIN) 100 MG tablet Take 100 mg by mouth daily      clobetasol propionate (CLOBEX) 0.05 % shampoo       eszopiclone (LUNESTA) 1 MG tablet Take 1 tablet (1 mg total) by mouth nightly.Take immediately before bedtime 90 tablet 3    fluconazole (DIFLUCAN) 150 MG tablet Take 1 tablet (150 mg total) by mouth as needed (yeast infection) 12 tablet 1    ketoconazole (NIZORAL) 2 % shampoo       Multiple Vitamin (MULTIVITAMIN) capsule Take 1 capsule by mouth daily.  spironolactone (ALDACTONE) 25 MG tablet Take 25 mg by mouth daily      Trintellix 5 MG Tab tablet Take 2.5 mg by mouth daily      Vortioxetine HBr (Trintellix) 20 MG Tab Take 20 mg by mouth daily      Vyvanse 50 MG capsule Take 50 mg by mouth every morning       No current facility-administered medications for this visit.       Review of Systems:  General ROS: negative for fatigue, fever/chills, weight loss  Breast ROS: negative for breast lumps, nipple discharge  Gastrointestinal ROS: no abdominal pain, change in bowel habits  Genitourinary ROS: no dysuria, trouble voiding, or hematuria  Skin: denies rash or lesion  Psych: denies depression  All other systems reviewed and are negative     Physical Exam:   BP 132/79 (BP Site: Left arm, Patient Position: Sitting, Cuff Size: Small)    Pulse 80    Temp 97.8 F (36.6 C) (Temporal)    Wt 119 lb 3.2 oz (54.1 kg)    LMP 03/07/2021 (Approximate)    BMI 20.02 kg/m   General appearance - alert, well appearing, and in no distress  Cardiovascular - regular rate and rhythm. No murmurs.  Respiratory - clear to auscultation bilaterally  Abdomen - soft, nontender, nondistended, no masses  Extremities: no signs of clubbing or edema   Pelvic exam:  VULVA: normal appearing vulva with no masses,  tenderness or lesions, normal clitoris  VAGINA: normal appearing vagina with normal color and discharge, no lesions  CERVIX: normal appearing cervix without discharge or lesions, IUD strings visualized  UTERUS: uterus is normal size, shape, consistency and nontender  ADNEXA:  nontender and no masses.  RECTAL: normal external, no hemorrhoids    Assessment:  1. Acute vaginitis    2. Breast cancer screening by mammogram        Plan:  - Discussed that vaginitis and pelvic pain could be s/t recent antibiotic use and illness  - Swabs sent to confirm any infection  - Mammogram order given for breast cancer screening  - RX diflucan sent for probable yeast infection s/t recent antibiotic use  - All questions answered   - Follow up if symptoms worsen or as needed    Orders Placed This Encounter   Procedures    SureSwab Mycoplasma/Ureaplasma Pane PCR    Mammo Digital Diagnostic Bilateral with CAD    Bacterial Vaginosis/Candida/Trich PCR - [place]     Lucretia Field, DNP, FNP-BC  [facility]

## 2021-04-02 LAB — BACTERIAL VAGINOSIS/CANDIDA/TRICH PCR - ~~LOC~~
Bacterial Vaginosis Marker DNA: NOT DETECTED
Candida Glabrata,DNA: NOT DETECTED
Candida Group DNA: NOT DETECTED
Candida krusei, DNA: NOT DETECTED
Trichomonas vaginalis, DNA: NOT DETECTED

## 2021-04-04 LAB — SURESWAB, MYCOPLASMA UREAPLASMA PANEL, PCR
M.genitalium TMA: NOT DETECTED
Mycoplasma Hominis PCR: NOT DETECTED
Ureaplasma parvum DNA PCR: NOT DETECTED
Ureaplasma urealyticum DNA PCR: NOT DETECTED

## 2021-04-16 ENCOUNTER — Other Ambulatory Visit: Payer: Self-pay | Admitting: Registered Nurse

## 2021-05-27 ENCOUNTER — Encounter (HOSPITAL_BASED_OUTPATIENT_CLINIC_OR_DEPARTMENT_OTHER): Payer: Self-pay | Admitting: Emergency Medicine

## 2021-05-27 ENCOUNTER — Other Ambulatory Visit: Payer: Self-pay

## 2021-05-27 DIAGNOSIS — Z87891 Personal history of nicotine dependence: Secondary | ICD-10-CM | POA: Insufficient documentation

## 2021-05-27 DIAGNOSIS — R109 Unspecified abdominal pain: Secondary | ICD-10-CM | POA: Diagnosis present

## 2021-05-27 DIAGNOSIS — R102 Pelvic and perineal pain: Secondary | ICD-10-CM | POA: Insufficient documentation

## 2021-05-27 NOTE — ED Triage Notes (Signed)
°  Patient comes in with lower abdominal pain that has been on and off for two weeks with vaginal discharge.  Patient states she thinks she has bacterial vaginosis because it feels similar to when she had it 2 years ago.  Taking tylenol and ibuprofen at home for pain management.  Pain 9/10, sharp lower abdominal pain.

## 2021-05-28 ENCOUNTER — Emergency Department (HOSPITAL_BASED_OUTPATIENT_CLINIC_OR_DEPARTMENT_OTHER): Payer: BC Managed Care – PPO

## 2021-05-28 ENCOUNTER — Emergency Department (HOSPITAL_BASED_OUTPATIENT_CLINIC_OR_DEPARTMENT_OTHER)
Admission: EM | Admit: 2021-05-28 | Discharge: 2021-05-28 | Disposition: A | Discharge status: Home / Self Care | Payer: BC Managed Care – PPO | Attending: Emergency Medicine | Admitting: Emergency Medicine

## 2021-05-28 DIAGNOSIS — R102 Pelvic and perineal pain: Secondary | ICD-10-CM

## 2021-05-28 LAB — URINALYSIS, ROUTINE W REFLEX MICROSCOPIC
Bilirubin Urine: NEGATIVE
Glucose, UA: NEGATIVE mg/dL
Hgb urine dipstick: NEGATIVE
Ketones, ur: NEGATIVE mg/dL
Nitrite: NEGATIVE
Protein, ur: 30 mg/dL — AB
Specific Gravity, Urine: 1.045 — ABNORMAL HIGH (ref 1.005–1.030)
pH: 6 (ref 5.0–8.0)

## 2021-05-28 LAB — COMPREHENSIVE METABOLIC PANEL
ALT: 32 U/L (ref 0–44)
AST: 19 U/L (ref 15–41)
Albumin: 3.5 g/dL (ref 3.5–5.0)
Alkaline Phosphatase: 85 U/L (ref 38–126)
Anion gap: 7 (ref 5–15)
BUN: 12 mg/dL (ref 6–20)
CO2: 26 mmol/L (ref 22–32)
Calcium: 8.5 mg/dL — ABNORMAL LOW (ref 8.9–10.3)
Chloride: 105 mmol/L (ref 98–111)
Creatinine, Ser: 0.53 mg/dL (ref 0.44–1.00)
GFR, Estimated: >60 mL/min (ref >60)
Glucose, Bld: 124 mg/dL — ABNORMAL HIGH (ref 70–99)
Potassium: 3.3 mmol/L — ABNORMAL LOW (ref 3.5–5.1)
Sodium: 138 mmol/L (ref 135–145)
Total Bilirubin: 0.3 mg/dL (ref 0.3–1.2)
Total Protein: 6.5 g/dL (ref 6.5–8.1)

## 2021-05-28 LAB — CBC WITH DIFFERENTIAL/PLATELET
Abs Immature Granulocytes: 0.04 10*3/uL (ref 0.00–0.07)
Basophils Absolute: 0 10*3/uL (ref 0.0–0.1)
Basophils Relative: 0 %
Eosinophils Absolute: 0.1 10*3/uL (ref 0.0–0.5)
Eosinophils Relative: 1 %
HCT: 35.6 % — ABNORMAL LOW (ref 36.0–46.0)
Hemoglobin: 11.8 g/dL — ABNORMAL LOW (ref 12.0–15.0)
Immature Granulocytes: 0 %
Lymphocytes Relative: 12 %
Lymphs Abs: 1.4 10*3/uL (ref 0.7–4.0)
MCH: 30.7 pg (ref 26.0–34.0)
MCHC: 33.1 g/dL (ref 30.0–36.0)
MCV: 92.7 fL (ref 80.0–100.0)
Monocytes Absolute: 0.8 10*3/uL (ref 0.1–1.0)
Monocytes Relative: 7 %
Neutro Abs: 9.5 10*3/uL — ABNORMAL HIGH (ref 1.7–7.7)
Neutrophils Relative %: 80 %
Platelets: 238 10*3/uL (ref 150–400)
RBC: 3.84 MIL/uL — ABNORMAL LOW (ref 3.87–5.11)
RDW: 12.6 % (ref 11.5–15.5)
WBC: 11.8 10*3/uL — ABNORMAL HIGH (ref 4.0–10.5)
nRBC: 0 % (ref 0.0–0.2)

## 2021-05-28 LAB — LIPASE, BLOOD: Lipase: 28 U/L (ref 11–51)

## 2021-05-28 LAB — WET PREP, GENITAL
Clue Cells Wet Prep HPF POC: NONE SEEN
Sperm: NONE SEEN
Trich, Wet Prep: NONE SEEN
WBC, Wet Prep HPF POC: <10 (ref <10)
Yeast Wet Prep HPF POC: NONE SEEN

## 2021-05-28 LAB — PREGNANCY, URINE: Preg Test, Ur: NEGATIVE

## 2021-05-28 MED ORDER — ONDANSETRON HCL 4 MG/2ML IJ SOLN
4.0000 mg | Freq: Once | INTRAMUSCULAR | Status: AC
  Administered 2021-05-28: 02:00:00 4 mg via INTRAVENOUS
  Filled 2021-05-28: qty 2

## 2021-05-28 MED ORDER — DOXYCYCLINE HYCLATE 100 MG PO CAPS
100.0000 mg | ORAL_CAPSULE | Freq: Two times a day (BID) | ORAL | 0 refills | Status: AC
Start: 2021-05-28 — End: 2021-06-11

## 2021-05-28 MED ORDER — DEXTROSE 5 % IV SOLN: 500.0000 mg | Freq: Once | INTRAVENOUS | Status: DC

## 2021-05-28 MED ORDER — DEXTROSE 5 % IV SOLN
500.0000 mg | Freq: Once | INTRAVENOUS | Status: DC
  Filled 2021-05-28: qty 500

## 2021-05-28 MED ORDER — ACETAMINOPHEN 500 MG PO TABS
1000.0000 mg | ORAL_TABLET | Freq: Once | ORAL | Status: AC
  Administered 2021-05-28: 06:00:00 1000 mg via ORAL
  Filled 2021-05-28: qty 2

## 2021-05-28 MED ORDER — IOHEXOL 300 MG/ML  SOLN
100.0000 mL | Freq: Once | INTRAMUSCULAR | Status: AC | PRN
  Administered 2021-05-28: 03:00:00 100 mL via INTRAVENOUS

## 2021-05-28 MED ORDER — METRONIDAZOLE 500 MG PO TABS
500.0000 mg | ORAL_TABLET | Freq: Two times a day (BID) | ORAL | 0 refills | Status: AC
Start: 2021-05-28 — End: 2021-06-11

## 2021-05-28 MED ORDER — FENTANYL CITRATE PF 50 MCG/ML IJ SOSY: 50.0000 ug | PREFILLED_SYRINGE | INTRAMUSCULAR | Status: DC | PRN

## 2021-05-28 MED ORDER — FENTANYL CITRATE PF 50 MCG/ML IJ SOSY
50.0000 ug | PREFILLED_SYRINGE | Freq: Once | INTRAMUSCULAR | Status: AC
  Administered 2021-05-28: 02:00:00 50 ug via INTRAVENOUS
  Filled 2021-05-28: qty 1

## 2021-05-28 MED ORDER — SODIUM CHLORIDE 0.9 % IV SOLN
1.0000 g | Freq: Once | INTRAVENOUS | Status: AC
  Administered 2021-05-28: 07:00:00 1 g via INTRAVENOUS
  Filled 2021-05-28: qty 10

## 2021-05-28 NOTE — ED Provider Notes (Signed)
Emergency Department Provider Note   I have reviewed the triage vital signs and the nursing notes.   HISTORY  Chief Complaint Abdominal Pain and Vaginal Discharge   HPI Crystal Hernandez is a 45 y.o. female with past medical history reviewed below presents the emergency department with 2 weeks of intermittent lower abdominal pain.  She developed some vaginal discharge and reports concerned that she may have developed bacterial vaginosis.  Denies concern for a sexually transmitted infection.  She has history of tubal ligation and reports her last menstrual cycle was December 7th.  She is not having vaginal bleeding.  Denies fevers, flank pain, upper abdominal discomfort.  No vomiting or diarrhea.  No pain to the chest.  No radiation of symptoms or other modifying factors.    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   Infection    UTI   Kidney abscess    Seasonal allergies    Vaginal Pap smear, abnormal     Patient Active Problem List   Diagnosis Date Noted   Ovarian cyst 03/08/2019   Overactive bladder 03/08/2019    Past Surgical History:  Procedure Laterality Date   ANTERIOR CRUCIATE LIGAMENT REPAIR     rt   COLPOSCOPY     KNEE SURGERY     TUBAL LIGATION     urethral stint     WISDOM TOOTH EXTRACTION      Allergies Morphine and related and Lactose intolerance (gi)  Family History  Problem Relation Age of Onset   Cancer Father        2000   Anesthesia problems Neg Hx    Hypotension Neg Hx    Malignant hyperthermia Neg Hx    Pseudochol deficiency Neg Hx     Social History Social History   Tobacco Use   Smoking status: Former    Packs/day: 0.50    Years: 26.00    Pack years: 13.00    Types: Cigarettes    Quit date: 06/07/2015    Years since quitting: 5.9   Smokeless tobacco: Never  Substance Use Topics   Alcohol use: Yes    Comment: "once a blue  moon"   Drug use: No    Review of Systems  Constitutional: No fever/chills Eyes: No visual changes. ENT: No sore throat. Cardiovascular: Denies chest pain. Respiratory: Denies shortness of breath. Gastrointestinal: Positive lower abdominal pain.  No nausea, no vomiting.  No diarrhea.  No constipation. Genitourinary: Negative for dysuria. Positive vaginal discharge.  Musculoskeletal: Negative for back pain. Skin: Negative for rash. Neurological: Negative for headaches, focal weakness or numbness.  10-point ROS otherwise negative.  ____________________________________________   PHYSICAL EXAM:  VITAL SIGNS: ED Triage Vitals [05/27/21 2337]  Enc Vitals Group     BP (!) 141/95     Pulse Rate 83     Resp 20     Temp 98.4 F (36.9 C)     Temp Source Oral     SpO2 100 %     Weight 205 lb (93 kg)     Height 5\' 4"  (1.626 m)    Constitutional: Alert and oriented. Well appearing and in no acute distress. Eyes: Conjunctivae are normal.  Head: Atraumatic. Nose: No congestion/rhinnorhea. Mouth/Throat: Mucous membranes are moist.   Neck: No stridor.   Cardiovascular: Normal rate, regular rhythm. Good peripheral circulation. Grossly normal heart  sounds.   Respiratory: Normal respiratory effort.  No retractions. Lungs CTAB. Gastrointestinal: Soft with mild diffuse tenderness in the lower abdomen. No upper abdominal tenderness. No peritonitis. No distention.  Genitourinary: Exam performed with nurse chaperone at bedside and patient's verbal consent.  External genitalia is normal with no masses, lesions, skin breakdown.  The patient has mild discomfort with the speculum exam.  Mild discharge.  No bleeding.  Patient with diffuse discomfort during the bimanual.  No cervical motion tenderness.  Musculoskeletal: No lower extremity tenderness nor edema. No gross deformities of extremities. Neurologic:  Normal speech and language. No gross focal neurologic deficits are appreciated.  Skin:   Skin is warm, dry and intact. No rash noted.  ____________________________________________   LABS (all labs ordered are listed, but only abnormal results are displayed)  Labs Reviewed  URINALYSIS, ROUTINE W REFLEX MICROSCOPIC - Abnormal; Notable for the following components:      Result Value   Specific Gravity, Urine 1.045 (*)    Protein, ur 30 (*)    Leukocytes,Ua MODERATE (*)    Bacteria, UA RARE (*)    All other components within normal limits  COMPREHENSIVE METABOLIC PANEL - Abnormal; Notable for the following components:   Potassium 3.3 (*)    Glucose, Bld 124 (*)    Calcium 8.5 (*)    All other components within normal limits  CBC WITH DIFFERENTIAL/PLATELET - Abnormal; Notable for the following components:   WBC 11.8 (*)    RBC 3.84 (*)    Hemoglobin 11.8 (*)    HCT 35.6 (*)    Neutro Abs 9.5 (*)    All other components within normal limits  WET PREP, GENITAL  URINE CULTURE  PREGNANCY, URINE  LIPASE, BLOOD  GC/CHLAMYDIA PROBE AMP ([place]) NOT AT [facility]   ____________________________________________  RADIOLOGY  CT ABDOMEN PELVIS W CONTRAST  Result Date: 05/28/2021 CLINICAL DATA:  Acute abdominal pain EXAM: CT ABDOMEN AND PELVIS WITH CONTRAST TECHNIQUE: Multidetector CT imaging of the abdomen and pelvis was performed using the standard protocol following bolus administration of intravenous contrast. CONTRAST:  OMNIPAQUE IOHEXOL 300 MG/ML  SOLN COMPARISON:  02/14/2014 FINDINGS: LOWER CHEST: Normal. HEPATOBILIARY: Normal hepatic contours. No intra- or extrahepatic biliary dilatation. Normal gallbladder. PANCREAS: Normal pancreas. No ductal dilatation or peripancreatic fluid collection. SPLEEN: Normal. ADRENALS/URINARY TRACT: There are bilateral adrenal nodules, which have been present since 02/14/2014. No hydronephrosis, nephroureterolithiasis or solid renal mass. The urinary bladder is normal for degree of distention STOMACH/BOWEL: There is no hiatal hernia.  Normal duodenal course and caliber. No small bowel dilatation or inflammation. No focal colonic abnormality. Normal appendix. VASCULAR/LYMPHATIC: Normal course and caliber of the major abdominal vessels. No abdominal or pelvic lymphadenopathy. REPRODUCTIVE: Complex appearance of the right ovary with surrounding inflammatory stranding. Small amount of free fluid tracks into the lower right pelvis. MUSCULOSKELETAL. No bony spinal canal stenosis or focal osseous abnormality. OTHER: None. IMPRESSION: 1. Complex appearance of the right ovary with surrounding inflammatory stranding and small amount of free fluid. Pelvic ultrasound recommended to assess for potential ovarian torsion or other acute abnormality. 2. Bilateral adrenal nodules, which have been present since 02/14/2014. These are most consistent with adenomas. Electronically Signed   By: Deatra Robinson M.D.   On: 05/28/2021 03:33   US PELVIC COMPLETE W TRANSVAGINAL AND TORSION R/O  Result Date: 05/28/2021 CLINICAL DATA:  Acute abdominal pain EXAM: TRANSABDOMINAL AND TRANSVAGINAL ULTRASOUND OF PELVIS DOPPLER ULTRASOUND OF OVARIES TECHNIQUE: Both transabdominal and transvaginal ultrasound examinations of the pelvis  were performed. Transabdominal technique was performed for global imaging of the pelvis including uterus, ovaries, adnexal regions, and pelvic cul-de-sac. It was necessary to proceed with endovaginal exam following the transabdominal exam to visualize the adnexa. Color and duplex Doppler ultrasound was utilized to evaluate blood flow to the ovaries. COMPARISON:  Abdominal CT from earlier today FINDINGS: Uterus Measurements: 9 x 6 x 5.5 cm = volume: 140 mL. Intramural mass in the right fundic region measuring up to 2.3 cm, consistent with fibroid. Endometrium Thickness: 10 mm.  No focal abnormality visualized. Right ovary When correlated with CT there is ill-defined enlargement of the right adnexa with a anterior cystic space measuring 2.2 cm and  elsewhere ill-defined soft tissue characteristics. Suspect the ovary itself measures up to 2.6 cm, with thickened adjacent adnexal/para ovarian soft tissue. Hypervascularity in this area correlating with hyper enhancement by CT. Left ovary Measurements: 2.8 x 1.8 x 2 cm = volume: 5.4 mL. Normal appearance/no adnexal mass. Pulsed Doppler evaluation of both ovaries demonstrates normal low-resistance arterial and venous waveforms. Other findings No abnormal free fluid. IMPRESSION: 1. Present bilateral ovarian blood flow. 2. Ill-defined right adnexal enlargement and hypervascularity, possible thickened tube around a normal sized ovary. Recommend follow-up pelvic ultrasound in 6-12 weeks. Electronically Signed   By: Jorje Guild M.D.   On: 05/28/2021 06:41    ____________________________________________   PROCEDURES  Procedure(s) performed:   Procedures  None  ____________________________________________   INITIAL IMPRESSION / ASSESSMENT AND PLAN / ED COURSE  Pertinent labs & imaging results that were available during my care of the patient were reviewed by me and considered in my medical decision making (see chart for details).   Patient presents emergency department with lower abdominal pain and vaginal discharge.  Has history of tubal ligation.  Low suspicion for ectopic pregnancy but she is still having menstrual cycles.  Have added on a pregnancy test.  No significant UTI symptoms.  Mild tenderness on exam but no peritonitis.   Differential diagnosis includes but is not exclusive to ectopic pregnancy, ovarian cyst, ovarian torsion, acute appendicitis, urinary tract infection, endometriosis, bowel obstruction, hernia, colitis, renal colic, gastroenteritis, volvulus etc.  Patient's pelvic exam is significant for more generalized discomfort with fairly minimal discharge.  No bleeding.  No clear etiology for patient's lower abdominal pain.  Given her diffuse tenderness do plan for CT imaging  along with lab work.   CT imaging results reviewed.  I independently reviewed the images.  Significant inflammation surrounding the right ovary noted.  Ultrasound ordered.  Initially considered transfer for ultrasound but the ultrasonographer will be here within the hour. Suspect that transfer for [place] would take more time than that. With 2 weeks of symptoms my suspicion for torsion is lower. Discussed with patient who will stay here for [place].   06:50 AM  Ultrasound resulting showing normal flow to both ovaries.  There is some hypervascular tubelike structure surrounding the right ovary but the ovary itself is normal.  Radiology is recommending repeat ultrasound in 6 to 12 weeks.  I discussed this with the patient and provided in the after visit summary along with contact information for OB/GYN.  Will treat the patient for PID.  She tells me that she did have sex with her ex several weeks ago and that symptoms seem to start around that time, upon further discussion. Sending UA for culture.  ____________________________________________  FINAL CLINICAL IMPRESSION(S) / ED DIAGNOSES  Final diagnoses:  Pelvic pain     MEDICATIONS GIVEN DURING  THIS VISIT:  Medications  fentaNYL (SUBLIMAZE) injection 50 mcg (has no administration in time range)  cefTRIAXone (ROCEPHIN) 1 g in sodium chloride 0.9 % 100 mL IVPB (has no administration in time range)  fentaNYL (SUBLIMAZE) injection 50 mcg (50 mcg Intravenous Given 05/28/21 0214)  ondansetron (ZOFRAN) injection 4 mg (4 mg Intravenous Given 05/28/21 0214)  iohexol (OMNIPAQUE) 300 MG/ML solution 100 mL (100 mLs Intravenous Contrast Given 05/28/21 0307)  acetaminophen (TYLENOL) tablet 1,000 mg (1,000 mg Oral Given 05/28/21 0538)     NEW OUTPATIENT MEDICATIONS STARTED DURING THIS VISIT:  New Prescriptions   DOXYCYCLINE (VIBRAMYCIN) 100 MG CAPSULE    Take 1 capsule (100 mg total) by mouth 2 (two) times daily for 14 days.   METRONIDAZOLE (FLAGYL) 500 MG  TABLET    Take 1 tablet (500 mg total) by mouth 2 (two) times daily for 14 days.    Note:  This document was prepared using Dragon voice recognition software and may include unintentional dictation errors.  Nanda Quinton, MD, [facility] Emergency Medicine    Irys Nigh, [facility], MD 05/28/21 [phone]

## 2021-05-28 NOTE — Discharge Instructions (Signed)
You were seen in the emergency room today with pelvic pain.  There is some inflammation surrounding your right ovary on the ultrasound and CT scan.  The radiologist is recommending a repeat ultrasound in 12 weeks to have this reassessed.  I have listed the name of an OB/GYN on the form here.  We are starting on antibiotics for the next 2 weeks.  You will be called if you need to change antibiotics or further infections come back positive on your labs.

## 2021-05-29 LAB — GC/CHLAMYDIA PROBE AMP (~~LOC~~) NOT AT ARMC
Chlamydia: NEGATIVE
Comment: NEGATIVE
Comment: NORMAL
Neisseria Gonorrhea: NEGATIVE

## 2021-05-30 LAB — URINE CULTURE: Culture: 80000 — AB

## 2021-06-01 ENCOUNTER — Telehealth: Payer: Self-pay | Admitting: Emergency Medicine

## 2021-06-01 NOTE — Telephone Encounter (Signed)
Post ED Visit - Positive Culture Follow-up  Culture report reviewed by antimicrobial stewardship pharmacist: [facility] []  , Pharm.D. []  Enzo Bi, Pharm.D., BCPS AQ-ID []  , Pharm.D., BCPS []  Celedonio Miyamoto, Pharm.D., BCPS []  [place], Garvin Fila.D., BCPS, AAHIVP []  , Pharm.D., BCPS, AAHIVP []  Georgina Pillion, PharmD, BCPS []  , PharmD, BCPS []  [place], PharmD, BCPS [x]  [address], PharmD []  , PharmD, BCPS []  Estella Husk, PharmD  Pharmacy Team []  Lysle Pearl, PharmD []  , PharmD []  Phillips Climes, PharmD []  , Rph []  Agapito Games) , PharmD []  Daylene Posey, PharmD []  , PharmD []  Mervyn Gay, PharmD []  , PharmD []  Vinnie Level, PharmD []  [facility], PharmD []  , PharmD []  Len Childs, PharmD   Positive urine culture Treated with Doxycycline, organism sensitive to the same and no further patient follow-up is required at this time.  Neilson Oehlert 06/01/2021, 10:43 AM

## 2021-06-04 ENCOUNTER — Ambulatory Visit (INDEPENDENT_AMBULATORY_CARE_PROVIDER_SITE_OTHER): Payer: Commercial Managed Care - POS | Admitting: Family Medicine

## 2021-06-04 ENCOUNTER — Encounter (INDEPENDENT_AMBULATORY_CARE_PROVIDER_SITE_OTHER): Payer: Self-pay | Admitting: Family Medicine

## 2021-06-04 VITALS — BP 130/79 | HR 73 | Temp 98.7°F | Ht 65.0 in | Wt 121.0 lb

## 2021-06-04 DIAGNOSIS — Z01818 Encounter for other preprocedural examination: Secondary | ICD-10-CM

## 2021-06-04 DIAGNOSIS — H029 Unspecified disorder of eyelid: Secondary | ICD-10-CM

## 2021-06-04 LAB — CBC AND DIFFERENTIAL
Absolute NRBC: 0 10*3/uL (ref 0.00–0.00)
Basophils Absolute Automated: 0.06 10*3/uL (ref 0.00–0.08)
Basophils Automated: 1 %
Eosinophils Absolute Automated: 0.1 10*3/uL (ref 0.00–0.44)
Eosinophils Automated: 1.7 %
Hematocrit: 37.8 % (ref 34.7–43.7)
Hgb: 12.6 g/dL (ref 11.4–14.8)
Immature Granulocytes Absolute: 0.03 10*3/uL (ref 0.00–0.07)
Immature Granulocytes: 0.5 %
Lymphocytes Absolute Automated: 1.4 10*3/uL (ref 0.42–3.22)
Lymphocytes Automated: 23.9 %
MCH: 31.3 pg (ref 25.1–33.5)
MCHC: 33.3 g/dL (ref 31.5–35.8)
MCV: 93.8 fL (ref 78.0–96.0)
MPV: 9.1 fL (ref 8.9–12.5)
Monocytes Absolute Automated: 0.42 10*3/uL (ref 0.21–0.85)
Monocytes: 7.2 %
Neutrophils Absolute: 3.86 10*3/uL (ref 1.10–6.33)
Neutrophils: 65.7 %
Nucleated RBC: 0 /100 WBC (ref 0.0–0.0)
Platelets: 559 10*3/uL — ABNORMAL HIGH (ref 142–346)
RBC: 4.03 10*6/uL (ref 3.90–5.10)
RDW: 12 % (ref 11–15)
WBC: 5.87 10*3/uL (ref 3.10–9.50)

## 2021-06-04 LAB — COMPREHENSIVE METABOLIC PANEL
ALT: 15 U/L (ref 0–55)
AST (SGOT): 19 U/L (ref 5–41)
Albumin/Globulin Ratio: 1.3 (ref 0.9–2.2)
Albumin: 3.9 g/dL (ref 3.5–5.0)
Alkaline Phosphatase: 50 U/L (ref 37–117)
Anion Gap: 8 (ref 5.0–15.0)
BUN: 18 mg/dL (ref 7.0–21.0)
Bilirubin, Total: 0.4 mg/dL (ref 0.2–1.2)
CO2: 26 mEq/L (ref 17–29)
Calcium: 9.7 mg/dL (ref 8.5–10.5)
Chloride: 103 mEq/L (ref 99–111)
Creatinine: 0.8 mg/dL (ref 0.4–1.0)
Globulin: 3.1 g/dL (ref 2.0–3.6)
Glucose: 106 mg/dL — ABNORMAL HIGH (ref 70–100)
Potassium: 4.6 mEq/L (ref 3.5–5.3)
Protein, Total: 7 g/dL (ref 6.0–8.3)
Sodium: 137 mEq/L (ref 135–145)

## 2021-06-04 LAB — GFR: EGFR: >60

## 2021-06-04 LAB — TSH: TSH: 1.07 u[IU]/mL (ref 0.35–4.94)

## 2021-06-04 LAB — T4, FREE: T4 Free: 0.83 ng/dL (ref 0.69–1.48)

## 2021-06-04 LAB — HEMOLYSIS INDEX: Hemolysis Index: 2 Index (ref 0–24)

## 2021-06-04 LAB — T3, FREE: T3, Free: 2.99 pg/mL (ref 1.71–3.71)

## 2021-06-04 NOTE — Progress Notes (Signed)
Hi,    I have reviewed your labs and they are reassuring.  We'll send the clearance to your surgeon.  Your platelet count is a little bit high again, please see hematology about this if you have not already.      Regards,    Antonieta Iba, MD  Family Medicine/Geriatrics    [facility] - Primary Care - [facility]  [address], [place], [place] [address]  Demetrius Charity [phone]     F [phone]

## 2021-06-04 NOTE — Progress Notes (Addendum)
Subjective:     Patient ID:  Deborah Kirk is a 46 y.o. female.       Here today for preoperative evaluation.  She is scheduled for bilateral eyelid surgery with Dr. Rica Records in [place] on 06/11/2021 as an outpatient procedure.  Patient has no acute concerns.  She is in her usual sate of health.  has no history of myocardial infarction.      Current Outpatient Medications   Medication Sig Dispense Refill    buPROPion (WELLBUTRIN) 100 MG tablet Take 100 mg by mouth daily      eszopiclone (LUNESTA) 1 MG tablet Take 1 tablet (1 mg total) by mouth nightly.Take immediately before bedtime 90 tablet 3    fluconazole (DIFLUCAN) 150 MG tablet Take 1 tablet (150 mg total) by mouth as needed (yeast infection) 12 tablet 1    Multiple Vitamin (MULTIVITAMIN) capsule Take 1 capsule by mouth daily.      spironolactone (ALDACTONE) 25 MG tablet Take 25 mg by mouth daily      Trintellix 5 MG Tab tablet Take 2.5 mg by mouth daily      Vortioxetine HBr (Trintellix) 20 MG Tab Take 20 mg by mouth daily      Vyvanse 50 MG capsule Take 50 mg by mouth every morning       No current facility-administered medications for this visit.         The following portions of the patient's history were reviewed and updated as appropriate: allergies, current medications, past family history, past medical history, past social history, past surgical history, and problem list.      Review of Systems   Constitutional:  Negative for chills, fatigue and fever.   Respiratory:  Negative for cough, shortness of breath and wheezing.    Cardiovascular:  Negative for chest pain and palpitations.   All other systems reviewed and are negative.      BP 130/79 (BP Site: Left arm, Patient Position: Sitting, Cuff Size: Medium)   Pulse 73   Temp 98.7 F (37.1 C) (Oral)   Ht 1.651 m (5\' 5" )   Wt 54.9 kg (121 lb)   LMP 05/26/2021 (Approximate)   BMI 20.14 kg/m      Objective:   Physical Exam  Vitals and nursing note reviewed.   Constitutional:       General:  She is not in acute distress.     Appearance: Normal appearance. She is well-developed.   Cardiovascular:      Rate and Rhythm: Normal rate and regular rhythm.      Heart sounds: Normal heart sounds. No murmur heard.    No friction rub. No gallop.   Pulmonary:      Effort: Pulmonary effort is normal. No respiratory distress.      Breath sounds: Normal breath sounds. No wheezing or rales.   Neurological:      Mental Status: She is alert.          Assessment:       ICD-10-CM    1. Preoperative examination  Z01.818 CBC and differential     Comprehensive metabolic panel     TSH     T4, free     T3, free      2. Eyelid problem  H02.9 CBC and differential     Comprehensive metabolic panel     TSH     T4, free     T3, free          Plan:  1.2.  Based on the revised cardiac risk index the patient is a low risk candidate for low risk surgery (eyelid surgery) at this time.  The patient's creatinine is pending and could affect this risk.  I will addend this note upon receipt of results.      Risk & benefits of any medication(s) given/prescribed were explained to the patient (and/or family) who verbalized understanding & agreed to the treatment plan.  Patient (and/or family) encouraged to contact me/clinical staff with any questions/concerns.      Procedures  :none      **Addendum**  Labs reviewed.  Patient remains a low risk candidate for low risk surgery and may proceed with surgery as scheduled.

## 2021-06-05 NOTE — Progress Notes (Signed)
Faxed PreOp Clearance Note, Form and labs to surgeon [phone]

## 2021-06-06 ENCOUNTER — Ambulatory Visit (INDEPENDENT_AMBULATORY_CARE_PROVIDER_SITE_OTHER): Payer: Commercial Managed Care - POS | Admitting: Registered Nurse

## 2021-06-06 VITALS — BP 121/77 | HR 85 | Temp 97.7°F | Wt 120.4 lb

## 2021-06-06 DIAGNOSIS — R102 Pelvic and perineal pain: Secondary | ICD-10-CM

## 2021-06-06 DIAGNOSIS — R300 Dysuria: Secondary | ICD-10-CM

## 2021-06-06 MED ORDER — NITROFURANTOIN MONOHYD MACRO 100 MG PO CAPS
100.0000 mg | ORAL_CAPSULE | Freq: Two times a day (BID) | ORAL | 0 refills | Status: AC
Start: 2021-06-06 — End: 2021-06-13

## 2021-06-06 NOTE — Progress Notes (Signed)
Return GYN Visit    Chief Complaint: Gynecologic Exam (Pt c/o of "pressure" in the pelvic area, n/v today.)      History of Present Illness: Deborah Kirk is a 46 y.o. female, [id number], LMP 05/26/21, presenting with pelvic pressure and abnormal bleeding. She complains of heavier bleeding with her last period and spotting until day 9. She had an IUD placed in August 2022 and had normal periods until this last period. She was concerned that she could be miscarrying.     She also had UTI symptoms and pelvic pain yesterday. She had some leftover augmentin from a sinus infection so took 1 dose. This morning, prior to her visit, she had an episode of nausea and vomiting, which she states is so unlike her. She is hoping to get a different antibiotic.     Past Medical History:  Past Medical History:   Diagnosis Date    Anxiety     Attention deficit hyperactivity disorder (ADHD)     Vaginal yeast infection     chronic           Past Surgical History:  Past Surgical History:   Procedure Laterality Date    CESAREAN SECTION      WISDOM TOOTH EXTRACTION         Social History:  Social History     Socioeconomic History    Marital status: Divorced   Tobacco Use    Smoking status: Never    Smokeless tobacco: Never   Substance and Sexual Activity    Alcohol use: No     Alcohol/week: 1.0 standard drink     Types: 1 Glasses of wine per week    Drug use: No    Sexual activity: Yes     Partners: Male     Birth control/protection: Condom, I.U.D.     Comment: spouse vasectomy       Family History:  Family History   Problem Relation Age of Onset    Sleep disorder Mother     Anxiety disorder Mother     Hepatitis Mother     Cancer Mother         Breast    Breast cancer Mother         dx in her [id number]    Depression Mother     Alzheimer's disease Father         5270's    Thyroid disease Sister     Depression Sister     Cancer Maternal Grandfather         Colon    Cancer Paternal Grandfather         Colon    Breast cancer Sister         Allergies:   Allergies   Allergen Reactions    Percocet [Oxycodone-Acetaminophen] Hives    Sulfa Antibiotics Hives    Sulfamethoxazole-Trimethoprim        Medications:   Current Outpatient Medications   Medication Sig Dispense Refill    buPROPion (WELLBUTRIN) 100 MG tablet Take 100 mg by mouth daily      eszopiclone (LUNESTA) 1 MG tablet Take 1 tablet (1 mg total) by mouth nightly.Take immediately before bedtime 90 tablet 3    fluconazole (DIFLUCAN) 150 MG tablet Take 1 tablet (150 mg total) by mouth as needed (yeast infection) 12 tablet 1    Multiple Vitamin (MULTIVITAMIN) capsule Take 1 capsule by mouth daily.      spironolactone (ALDACTONE) 25 MG tablet Take 25 mg by  mouth daily      Trintellix 5 MG Tab tablet Take 2.5 mg by mouth daily      Vortioxetine HBr (Trintellix) 20 MG Tab Take 20 mg by mouth daily      Vyvanse 50 MG capsule Take 50 mg by mouth every morning      nitrofurantoin, macrocrystal-monohydrate, (MACROBID) 100 MG capsule Take 1 capsule (100 mg) by mouth 2 (two) times daily for 7 days 14 capsule 0     No current facility-administered medications for this visit.     Review of Systems   Constitutional:  Negative for fever.   HENT:  Negative for hearing loss.    Eyes:  Negative for blurred vision.   Respiratory:  Negative for cough.    Cardiovascular:  Negative for chest pain.   Gastrointestinal:  Positive for nausea and vomiting. Negative for abdominal pain.   Genitourinary:  Positive for dysuria and urgency.   Musculoskeletal:  Negative for myalgias.   Skin:  Negative for rash.   Neurological:  Negative for dizziness.   Endo/Heme/Allergies:  Does not bruise/bleed easily.   Psychiatric/Behavioral:  Negative for depression.        Physical Exam:   BP 121/77   Pulse 85   Temp 97.7 F (36.5 C)   Wt 120 lb 6.4 oz (54.6 kg)   LMP 05/26/2021 (Approximate)   BMI 20.04 kg/m   General appearance - alert, well appearing, and in no distress  Cardiovascular - regular rate and rhythm. No  murmurs.  Respiratory - clear to auscultation bilaterally  Abdomen - soft, nontender, nondistended, no masses  Extremities: no signs of clubbing or edema   Pelvic exam:  VULVA: normal appearing vulva with no masses, tenderness or lesions, normal clitoris  VAGINA: normal appearing vagina with normal color and discharge, no lesions  CERVIX: normal appearing cervix without discharge or lesions, IUD strings visualized  UTERUS: uterus is normal size, shape, consistency and nontender  ADNEXA:  nontender and no masses.  RECTAL: normal external, no hemorrhoids    Assessment:  1. Dysuria    2. Acute pelvic pain, female        Plan:  - Discussed possible causes of pelvic pain/pressure and abnormal bleeding, including normal adverse effect of IUD  - TVUS ordered to r/o structural abnormalities and to verify IUD  - Discussed common organisms that can cause UTI and effective treatments  - Macrobid RX sent  - All questions answered    - Follow up if symptoms worsen    Orders Placed This Encounter   Procedures    US Pelvis with Transvaginal       Lucretia Field, DNP, FNP-BC  [facility]

## 2021-06-28 ENCOUNTER — Other Ambulatory Visit: Payer: Self-pay | Admitting: Registered Nurse

## 2021-08-30 ENCOUNTER — Encounter (HOSPITAL_COMMUNITY): Payer: Self-pay | Admitting: Radiology

## 2021-09-21 ENCOUNTER — Other Ambulatory Visit (INDEPENDENT_AMBULATORY_CARE_PROVIDER_SITE_OTHER): Payer: Self-pay | Admitting: Registered Nurse

## 2021-12-28 ENCOUNTER — Encounter (HOSPITAL_BASED_OUTPATIENT_CLINIC_OR_DEPARTMENT_OTHER): Payer: Self-pay | Admitting: Emergency Medicine

## 2021-12-28 ENCOUNTER — Emergency Department (HOSPITAL_BASED_OUTPATIENT_CLINIC_OR_DEPARTMENT_OTHER): Payer: Self-pay | Admitting: Radiology

## 2021-12-28 ENCOUNTER — Emergency Department (HOSPITAL_BASED_OUTPATIENT_CLINIC_OR_DEPARTMENT_OTHER): Payer: Self-pay

## 2021-12-28 ENCOUNTER — Other Ambulatory Visit: Payer: Self-pay

## 2021-12-28 ENCOUNTER — Emergency Department (HOSPITAL_BASED_OUTPATIENT_CLINIC_OR_DEPARTMENT_OTHER)
Admission: EM | Admit: 2021-12-28 | Discharge: 2021-12-28 | Disposition: A | Discharge status: Home / Self Care | Payer: Self-pay | Attending: Emergency Medicine | Admitting: Emergency Medicine

## 2021-12-28 DIAGNOSIS — M7989 Other specified soft tissue disorders: Secondary | ICD-10-CM

## 2021-12-28 DIAGNOSIS — R2232 Localized swelling, mass and lump, left upper limb: Secondary | ICD-10-CM | POA: Insufficient documentation

## 2021-12-28 NOTE — ED Triage Notes (Signed)
Left arm swelling since yesterday.unsure if she hit it at work.

## 2021-12-28 NOTE — ED Notes (Signed)
Patient transported to Ultrasound 

## 2021-12-28 NOTE — Discharge Instructions (Addendum)
Recommend following up with orthopedic or hand specialist regarding your concerns today.  In the meantime recommend taking anti-inflammatories as needed for pain.  For the swelling recommend elevating your arm, ice, rest.

## 2021-12-28 NOTE — ED Notes (Signed)
Back from US.

## 2021-12-28 NOTE — ED Provider Notes (Signed)
MEDCENTER [facility] EMERGENCY DEPT Provider Note   CSN: [id number] Arrival date & time: 12/28/21  3151     History {Add pertinent medical, surgical, social history, OB history to HPI:1} Chief Complaint  Patient presents with   Arm Injury    Crystal Hernandez is a 46 y.o. female.  HPI     Home Medications Prior to Admission medications   Medication Sig Start Date End Date Taking? Authorizing Provider  acetaminophen (TYLENOL) 500 MG tablet Take 1,000 mg by mouth every 6 (six) hours as needed for moderate pain or headache.     [provider]  acidophilus (RISAQUAD) CAPS capsule Take 1 capsule by mouth daily.    [provider]  Multiple Vitamin (MULTIVITAMIN) tablet Take 1 tablet by mouth daily.    [provider]  vitamin C (ASCORBIC ACID) 500 MG tablet Take 500 mg by mouth daily.    [provider]      Allergies    Morphine and related and Lactose intolerance (gi)    Review of Systems   Review of Systems  Physical Exam Updated Vital Signs BP (!) 156/100 (BP Location: Right Arm)   Pulse 91   Temp 98.2 F (36.8 C)   Resp 18   SpO2 98%  Physical Exam  ED Results / Procedures / Treatments   Labs (all labs ordered are listed, but only abnormal results are displayed) Labs Reviewed - No data to display  EKG None  Radiology US Venous Img Upper Uni Left (DVT)  Result Date: 12/28/2021 CLINICAL DATA:  Pain and swelling EXAM: Left UPPER EXTREMITY VENOUS DOPPLER ULTRASOUND TECHNIQUE: Gray-scale sonography with graded compression, as well as color Doppler and duplex ultrasound were performed to evaluate the upper extremity deep venous system from the level of the subclavian vein and including the jugular, axillary, basilic, radial, ulnar and upper cephalic vein. Spectral Doppler was utilized to evaluate flow at rest and with distal augmentation maneuvers. COMPARISON:  None Available. FINDINGS: Contralateral Subclavian Vein:  Respiratory phasicity is normal and symmetric with the symptomatic side. No evidence of thrombus. Normal compressibility. Internal Jugular Vein: No evidence of thrombus. Normal compressibility, respiratory phasicity and response to augmentation. Subclavian Vein: No evidence of thrombus. Normal compressibility, respiratory phasicity and response to augmentation. Axillary Vein: No evidence of thrombus. Normal compressibility, respiratory phasicity and response to augmentation. Cephalic Vein: No evidence of thrombus. Normal compressibility, respiratory phasicity and response to augmentation. Basilic Vein: No evidence of thrombus. Normal compressibility, respiratory phasicity and response to augmentation. Brachial Veins: No evidence of thrombus. Normal compressibility, respiratory phasicity and response to augmentation. Radial Veins: No evidence of thrombus. Normal compressibility, respiratory phasicity and response to augmentation. Ulnar Veins: No evidence of thrombus. Normal compressibility, respiratory phasicity and response to augmentation. Venous Reflux:  None visualized. Other Findings:  None visualized. IMPRESSION: No evidence of DVT within the left upper extremity. Electronically Signed   By: Ernie Avena M.D.   On: 12/28/2021 14:16   DG Hand Complete Left  Result Date: 12/28/2021 CLINICAL DATA:  Left hand pain, injury EXAM: LEFT HAND - COMPLETE 3+ VIEW COMPARISON:  12/26/2015 FINDINGS: There is no evidence of fracture or dislocation. There is no evidence of arthropathy or other focal bone abnormality. Mild soft tissue swelling. IMPRESSION: Mild soft tissue swelling without acute fracture or dislocation. Electronically Signed   By: Duanne Guess D.O.   On: 12/28/2021 10:35    Procedures Procedures  {Document cardiac monitor, telemetry assessment procedure when appropriate:1}  Medications Ordered in  ED Medications - No data to display  ED Course/ Medical Decision Making/ A&P                            Medical Decision Making Amount and/or Complexity of Data Reviewed Radiology: ordered.   ***  {Document critical care time when appropriate:1} {Document review of labs and clinical decision tools ie heart score, Chads2Vasc2 etc:1}  {Document your independent review of radiology images, and any outside records:1} {Document your discussion with family members, caretakers, and with consultants:1} {Document social determinants of health affecting pt's care:1} {Document your decision making why or why not admission, treatments were needed:1} Final Clinical Impression(s) / ED Diagnoses Final diagnoses:  None    Rx / DC Orders ED Discharge Orders     None

## 2022-01-15 ENCOUNTER — Emergency Department (HOSPITAL_BASED_OUTPATIENT_CLINIC_OR_DEPARTMENT_OTHER)
Admission: EM | Admit: 2022-01-15 | Discharge: 2022-01-15 | Disposition: A | Discharge status: Home / Self Care | Payer: Self-pay | Attending: Emergency Medicine | Admitting: Emergency Medicine

## 2022-01-15 ENCOUNTER — Encounter (HOSPITAL_BASED_OUTPATIENT_CLINIC_OR_DEPARTMENT_OTHER): Payer: Self-pay

## 2022-01-15 ENCOUNTER — Other Ambulatory Visit: Payer: Self-pay

## 2022-01-15 DIAGNOSIS — L03114 Cellulitis of left upper limb: Secondary | ICD-10-CM

## 2022-01-15 DIAGNOSIS — L02414 Cutaneous abscess of left upper limb: Secondary | ICD-10-CM | POA: Insufficient documentation

## 2022-01-15 MED ORDER — DOXYCYCLINE HYCLATE 100 MG PO TABS
100.0000 mg | ORAL_TABLET | Freq: Once | ORAL | Status: AC
  Administered 2022-01-15: 100 mg via ORAL
  Filled 2022-01-15: qty 1

## 2022-01-15 MED ORDER — DOXYCYCLINE HYCLATE 100 MG PO CAPS: 100.0000 mg | ORAL_CAPSULE | Freq: Two times a day (BID) | ORAL | 0 refills | Status: AC

## 2022-01-15 NOTE — ED Provider Notes (Signed)
MEDCENTER [facility] EMERGENCY DEPT Provider Note   CSN: [id number] Arrival date & time: 01/15/22  1302     History Chief Complaint  Patient presents with   Abscess    Crystal Hernandez is a 46 y.o. female otherwise healthy presents to the ED for evaluation of left arm abscess for the past week. The patient reports that her and her daughter have been picking at it and have been able to get puss to come out of it, but it is becoming red. Denies any fever, arm pain, or trouble moving her arm. Denies any trauma to the area. Allergic to morphine.    Abscess Associated symptoms: no fever        Home Medications Prior to Admission medications   Medication Sig Start Date End Date Taking? Authorizing Provider  acetaminophen (TYLENOL) 500 MG tablet Take 1,000 mg by mouth every 6 (six) hours as needed for moderate pain or headache.     [provider]  acidophilus (RISAQUAD) CAPS capsule Take 1 capsule by mouth daily.    [provider]  Multiple Vitamin (MULTIVITAMIN) tablet Take 1 tablet by mouth daily.    [provider]  vitamin C (ASCORBIC ACID) 500 MG tablet Take 500 mg by mouth daily.    [provider]      Allergies    Morphine and related and Lactose intolerance (gi)    Review of Systems   Review of Systems  Constitutional:  Negative for chills and fever.  Respiratory:  Negative for shortness of breath.   Cardiovascular:  Negative for chest pain.  Musculoskeletal:  Negative for arthralgias and myalgias.  Skin:  Positive for wound.    Physical Exam Updated Vital Signs BP (!) 158/117 (BP Location: Right Arm)   Pulse 87   Temp 99 F (37.2 C) (Oral)   Resp 16   Ht 5\' 4"  (1.626 m)   Wt 93 kg   SpO2 99%   BMI 35.19 kg/m  Physical Exam Vitals and nursing note reviewed.  Constitutional:      General: She is not in acute distress.    Appearance: Normal appearance. She is not ill-appearing or toxic-appearing.  Eyes:      General: No scleral icterus. Pulmonary:     Effort: Pulmonary effort is normal. No respiratory distress.  Skin:    General: Skin is dry.     Findings: Erythema present. No rash.     Comments: Approximately 8 cm x 7 cm area of erythema with 3 cm x 2 cm area of central punctate.  Small, surrounding area is firm with some induration however no fluctuance.  Compartments are still soft.  Sensation intact.  Palpable pulses.  Patient has full range of motion.  Neurological:     General: No focal deficit present.     Mental Status: She is alert. Mental status is at baseline.  Psychiatric:        Mood and Affect: Mood normal.      ED Results / Procedures / Treatments   Labs (all labs ordered are listed, but only abnormal results are displayed) Labs Reviewed - No data to display  EKG None  Radiology No results found.  Procedures Procedures   Medications Ordered in ED Medications - No data to display  ED Course/ Medical Decision Making/ A&P                           Medical Decision Making Risk  Prescription drug management.   46 year old female presents the emergency room for evaluation of the sore on her left arm.  Differential diagnosis includes was limited to abscess, cyst, cellulitis, SJS, TENS.  Vital signs show elevated blood pressure otherwise normal.  Physical exam as noted above.  Please see picture.  Ultrasound performed at bedside and shows some cobblestoning however no drainable product is seen.  I doubt any SJS or TENS the patient is not on any medications that would cause this.  Advised patient to continue with warm compresses multiple times daily.  We will place the patient on doxycycline twice daily for the next 7 days. First dose given in the ER.  I advised the patient to stop picking at the area.  I demarcated the area with a skin marker.  Advised patient redness were to grow outside this line to come back to the emergency department.  We discussed other return  precautions and red flag symptoms.  Patient verbalized understanding and agrees to the plan.  Patient is stable being discharged home in good condition.  I discussed this case with my attending physician who cosigned this note including patient's presenting symptoms, physical exam, and planned diagnostics and interventions. Attending physician stated agreement with plan or made changes to plan which were implemented.   Attending physician assessed patient at bedside.  Final Clinical Impression(s) / ED Diagnoses Final diagnoses:  Cellulitis of left upper extremity    Rx / DC Orders ED Discharge Orders          Ordered    doxycycline (VIBRAMYCIN) 100 MG capsule  2 times daily        01/15/22 1816              Achille Rich, PA-C 01/15/22 1827    Charlynne Pander, MD 01/17/22 [phone]

## 2022-01-15 NOTE — ED Triage Notes (Signed)
Patient here POV from Home.  Endorses Abscess-Like Formation that has been present and worsening in Size for approximately 1 Week. Located to Left Posterior Proximal Forearm.   No Known Fevers. Some Drainage. No Known Insect Bites.  NAD Noted during Triage. A&Ox4. GCS 15. Ambulatory.

## 2022-01-15 NOTE — Discharge Instructions (Addendum)
You were seen in the ER for evaluation of your abscess on your arm. We are going to place you on an antibiotic for you to take twice daily for the next 7 days. Please continue to place warm compresses on the area multiple times daily.  Additionally, please stop picking at the area as this can introduce new infection into the area.  If the area is redness grows beyond the circle I drew, please return to the nearest emergency department for reevaluation.  If you have any concerns, new or worsening symptoms, please return to the nearest emergency department for evaluation.  Contact a doctor if: You have a fever. You do not start to get better after 1-2 days of treatment. Your bone or joint under the infected area starts to hurt after the skin has healed. Your infection comes back. This can happen in the same area or another area. You have a swollen bump in the area. You have new symptoms. You feel ill and have muscle aches and pains. Get help right away if: Your symptoms get worse. You feel very sleepy. You throw up (vomit) or have watery poop (diarrhea) for a long time. You see red streaks coming from the area. Your red area gets larger. Your red area turns dark in color. These symptoms may represent a serious problem that is an emergency. Do not wait to see if the symptoms will go away. Get medical help right away. Call your local emergency services (911 in the [place] Do not drive yourself to the hospital.

## 2022-03-06 ENCOUNTER — Ambulatory Visit (INDEPENDENT_AMBULATORY_CARE_PROVIDER_SITE_OTHER): Payer: Commercial Managed Care - POS | Admitting: Family Medicine

## 2022-03-06 ENCOUNTER — Encounter (INDEPENDENT_AMBULATORY_CARE_PROVIDER_SITE_OTHER): Payer: Self-pay | Admitting: Family Medicine

## 2022-03-06 VITALS — BP 108/60 | HR 83 | Temp 97.6°F | Wt 128.4 lb

## 2022-03-06 DIAGNOSIS — Z1211 Encounter for screening for malignant neoplasm of colon: Secondary | ICD-10-CM

## 2022-03-06 DIAGNOSIS — Z1231 Encounter for screening mammogram for malignant neoplasm of breast: Secondary | ICD-10-CM

## 2022-03-06 DIAGNOSIS — E063 Autoimmune thyroiditis: Secondary | ICD-10-CM

## 2022-03-06 DIAGNOSIS — R635 Abnormal weight gain: Secondary | ICD-10-CM

## 2022-03-06 DIAGNOSIS — L659 Nonscarring hair loss, unspecified: Secondary | ICD-10-CM

## 2022-03-06 DIAGNOSIS — R198 Other specified symptoms and signs involving the digestive system and abdomen: Secondary | ICD-10-CM

## 2022-03-06 LAB — CBC AND DIFFERENTIAL
Absolute NRBC: 0 10*3/uL (ref 0.00–0.00)
Basophils Absolute Automated: 0.05 10*3/uL (ref 0.00–0.08)
Basophils Automated: 0.5 %
Eosinophils Absolute Automated: 0.04 10*3/uL (ref 0.00–0.44)
Eosinophils Automated: 0.4 %
Hematocrit: 35.1 % (ref 34.7–43.7)
Hgb: 11.4 g/dL (ref 11.4–14.8)
Immature Granulocytes Absolute: 0.02 10*3/uL (ref 0.00–0.07)
Immature Granulocytes: 0.2 %
Instrument Absolute Neutrophil Count: 6.9 10*3/uL — ABNORMAL HIGH (ref 1.10–6.33)
Lymphocytes Absolute Automated: 1.74 10*3/uL (ref 0.42–3.22)
Lymphocytes Automated: 18.6 %
MCH: 30 pg (ref 25.1–33.5)
MCHC: 32.5 g/dL (ref 31.5–35.8)
MCV: 92.4 fL (ref 78.0–96.0)
MPV: 9.2 fL (ref 8.9–12.5)
Monocytes Absolute Automated: 0.62 10*3/uL (ref 0.21–0.85)
Monocytes: 6.6 %
Neutrophils Absolute: 6.9 10*3/uL — ABNORMAL HIGH (ref 1.10–6.33)
Neutrophils: 73.7 %
Nucleated RBC: 0 /100 WBC (ref 0.0–0.0)
Platelets: 524 10*3/uL — ABNORMAL HIGH (ref 142–346)
RBC: 3.8 10*6/uL — ABNORMAL LOW (ref 3.90–5.10)
RDW: 13 % (ref 11–15)
WBC: 9.37 10*3/uL (ref 3.10–9.50)

## 2022-03-06 LAB — TSH: TSH: 0.52 u[IU]/mL (ref 0.35–4.94)

## 2022-03-06 LAB — COMPREHENSIVE METABOLIC PANEL
ALT: 17 U/L (ref 0–55)
AST (SGOT): 25 U/L (ref 5–41)
Albumin/Globulin Ratio: 1.4 (ref 0.9–2.2)
Albumin: 4 g/dL (ref 3.5–5.0)
Alkaline Phosphatase: 44 U/L (ref 37–117)
Anion Gap: 9 (ref 5.0–15.0)
BUN: 14 mg/dL (ref 7.0–21.0)
Bilirubin, Total: 0.4 mg/dL (ref 0.2–1.2)
CO2: 25 mEq/L (ref 17–29)
Calcium: 9.6 mg/dL (ref 8.5–10.5)
Chloride: 104 mEq/L (ref 99–111)
Creatinine: 0.8 mg/dL (ref 0.4–1.0)
Globulin: 2.8 g/dL (ref 2.0–3.6)
Glucose: 106 mg/dL — ABNORMAL HIGH (ref 70–100)
Potassium: 4.5 mEq/L (ref 3.5–5.3)
Protein, Total: 6.8 g/dL (ref 6.0–8.3)
Sodium: 138 mEq/L (ref 135–145)
eGFR: >60 mL/min/{1.73_m2} (ref >=60)

## 2022-03-06 LAB — URINALYSIS REFLEX TO MICROSCOPIC EXAM - REFLEX TO CULTURE
Bilirubin, UA: NEGATIVE
Blood, UA: NEGATIVE
Glucose, UA: NEGATIVE
Leukocyte Esterase, UA: NEGATIVE
Nitrite, UA: NEGATIVE
Protein, UR: NEGATIVE
Specific Gravity UA: 1.006 (ref 1.001–1.035)
Urine pH: 6 (ref 5.0–8.0)
Urobilinogen, UA: NORMAL mg/dL

## 2022-03-06 LAB — ESTRADIOL: Estradiol: 158 pg/mL

## 2022-03-06 LAB — CORTISOL: Cortisol: 11.6 ug/dL

## 2022-03-06 LAB — LUTEINIZING HORMONE: LH: 2.6 m[IU]/mL

## 2022-03-06 LAB — HEMOLYSIS INDEX: Hemolysis Index: 4 Index (ref 0–24)

## 2022-03-06 LAB — LIPID PANEL
Cholesterol / HDL Ratio: 2 Index
Cholesterol: 138 mg/dL (ref 0–199)
HDL: 69 mg/dL (ref 40–9999)
LDL Calculated: 62 mg/dL (ref 0–99)
Triglycerides: 36 mg/dL (ref 34–149)
VLDL Calculated: 7 mg/dL — ABNORMAL LOW (ref 10–40)

## 2022-03-06 LAB — PROGESTERONE: Progesterone: 3.4 ng/mL

## 2022-03-06 LAB — HEMOGLOBIN A1C
Average Estimated Glucose: 88.2 mg/dL
Hemoglobin A1C: 4.7 % (ref 4.6–5.6)

## 2022-03-06 LAB — VITAMIN D,25 OH,TOTAL: Vitamin D, 25 OH, Total: 49 ng/mL (ref 30–100)

## 2022-03-06 LAB — T3, FREE: T3, Free: 2.79 pg/mL (ref 1.71–3.71)

## 2022-03-06 LAB — PROLACTIN: Prolactin: 17.2 ng/mL (ref 5.2–26.5)

## 2022-03-06 LAB — T4, FREE: T4 Free: 0.89 ng/dL (ref 0.69–1.48)

## 2022-03-06 LAB — FOLLICLE STIMULATING HORMONE: Follicle Stimulating Hormone: 4.66 m[IU]/mL

## 2022-03-06 NOTE — Progress Notes (Signed)
Subjective:     Patient ID:  Deborah Kirk is a 46 y.o. female.       Here today for acute visit due to unintentional weight gain over the past week.  Has gained about 7 pounds in the past week.  She also feels constipated and bloated.  Recently started Aldactone (from another manufacturer) and wonders if it is contributing.  Still has normal menstrual cycles.  Otherwise feels well, eating healthy.  Not sure what is causing her weight gain.  She would like to have her hormone levels checked as she has in the past.        Current Outpatient Medications   Medication Sig Dispense Refill    buPROPion (WELLBUTRIN) 100 MG tablet Take 1 tablet (100 mg) by mouth daily      Multiple Vitamin (MULTIVITAMIN) capsule Take 1 capsule by mouth daily      spironolactone (ALDACTONE) 50 MG tablet       Trintellix 5 MG Tab tablet Take 0.5 tablets (2.5 mg) by mouth daily      Vortioxetine HBr (Trintellix) 20 MG Tab Take 1 tablet (20 mg) by mouth daily      Vyvanse 50 MG capsule Take 1 capsule (50 mg) by mouth every morning      eszopiclone (LUNESTA) 1 MG tablet Take 1 tablet (1 mg total) by mouth nightly.Take immediately before bedtime 90 tablet 3     No current facility-administered medications for this visit.         The following portions of the patient's history were reviewed and updated as appropriate: allergies, current medications, past medical history, and problem list.      Review of Systems   Constitutional:  Negative for chills, fatigue and fever.   Respiratory:  Negative for cough, shortness of breath and wheezing.    Cardiovascular:  Negative for chest pain and palpitations.   Gastrointestinal:  Positive for abdominal distention and constipation.   All other systems reviewed and are negative.      BP 108/60 (BP Site: Right arm, Patient Position: Sitting)   Pulse 83   Temp 97.6 F (36.4 C) (Temporal)   Wt 58.2 kg (128 lb 6.4 oz)   BMI 21.37 kg/m      Objective:   Physical Exam  Vitals and nursing note reviewed.    Constitutional:       General: She is not in acute distress.     Appearance: Normal appearance. She is well-developed.   Cardiovascular:      Rate and Rhythm: Normal rate and regular rhythm.      Heart sounds: Normal heart sounds. No murmur heard.     No friction rub. No gallop.   Pulmonary:      Effort: Pulmonary effort is normal. No respiratory distress.      Breath sounds: Normal breath sounds. No wheezing or rales.   Abdominal:      General: Abdomen is flat. Bowel sounds are normal. There is no distension.      Palpations: Abdomen is soft. There is no mass.      Tenderness: There is no abdominal tenderness. There is no guarding or rebound.      Hernia: No hernia is present.   Neurological:      Mental Status: She is alert.            Assessment:      Diagnosis ICD-10-CM Associated Order   1. Unintended weight gain  R63.5 CBC and differential  Comprehensive metabolic panel     Lipid panel     Hemoglobin A1C     TSH     Vitamin D,25 OH, Total     Urinalysis Reflex to Microscopic Exam- Reflex to Culture     T4, free     T3, free     Prolactin     Follicle stimulating hormone     Estradiol     Progesterone     Luteinizing hormone     Cortisol      2. Hashimoto's thyroiditis  E06.3 CBC and differential     Comprehensive metabolic panel     Lipid panel     Hemoglobin A1C     TSH     Vitamin D,25 OH, Total     Urinalysis Reflex to Microscopic Exam- Reflex to Culture     T4, free     T3, free     Prolactin     Follicle stimulating hormone     Estradiol     Progesterone     Luteinizing hormone     Cortisol      3. Alopecia  L65.9 CBC and differential     Comprehensive metabolic panel     Lipid panel     Hemoglobin A1C     TSH     Vitamin D,25 OH, Total     Urinalysis Reflex to Microscopic Exam- Reflex to Culture     Prolactin     Follicle stimulating hormone     Estradiol     Progesterone     Luteinizing hormone     Cortisol      4. Altered bowel function  R19.8 CBC and differential     Comprehensive metabolic panel      Lipid panel     Hemoglobin A1C     TSH     Vitamin D,25 OH, Total     Urinalysis Reflex to Microscopic Exam- Reflex to Culture     Prolactin     Follicle stimulating hormone     Estradiol     Progesterone     Luteinizing hormone     Cortisol      5. Colon cancer screening  Z12.11 Referral to Gastroenterology (EXTERNAL)      6. Encounter for screening mammogram for malignant neoplasm of breast  Z12.31 Mammo Digital 2D Screening Bilateral W CAD          Plan:     1.2.3.4.  Acute, unclear etiology.  Labs as above to assess for metabolic cause.  Recommend continuing with healthy eating habits and regular exercise.    5.  Refer to gastroenterology.    6.  Mammogram ordered.      Risk & benefits of any medication(s) given/prescribed were explained to the patient (and/or family) who verbalized understanding & agreed to the treatment plan.  Patient (and/or family) encouraged to contact me/clinical staff with any questions/concerns.      Procedures  :none

## 2022-03-12 NOTE — Progress Notes (Signed)
Hi,    I have reviewed your labs and they are reassuring/stable.  I have no new medications to recommend.  We can repeat these labs at your next physical.      Regards,    Breonna Gafford R. Ria Redcay, MD  Family Medicine/Geriatrics    [facility] - [place]  [address], [place], [place] [address]  P [phone]     F [phone]

## 2022-04-17 ENCOUNTER — Other Ambulatory Visit: Payer: Self-pay | Admitting: Family Medicine

## 2022-04-22 NOTE — Progress Notes (Signed)
Hi,    Your mammogram returned normal.  Recommendation is to repeat in 1 year.      Regards,    Aryon Nham R. Sammye Staff, MD  Family Medicine/Geriatrics    [facility] - [facility] - [place]  [address], [place], [place] [address]  P [phone]     F [phone]

## 2022-12-30 ENCOUNTER — Encounter (INDEPENDENT_AMBULATORY_CARE_PROVIDER_SITE_OTHER): Payer: Self-pay | Admitting: Family Medicine

## 2022-12-30 ENCOUNTER — Ambulatory Visit (FREE_STANDING_LABORATORY_FACILITY): Payer: Commercial Managed Care - POS | Admitting: Family Medicine

## 2022-12-30 VITALS — BP 98/60 | HR 84 | Temp 98.1°F | Wt 118.0 lb

## 2022-12-30 DIAGNOSIS — B999 Unspecified infectious disease: Secondary | ICD-10-CM

## 2022-12-30 DIAGNOSIS — Z1231 Encounter for screening mammogram for malignant neoplasm of breast: Secondary | ICD-10-CM

## 2022-12-30 DIAGNOSIS — B2790 Infectious mononucleosis, unspecified without complication: Secondary | ICD-10-CM

## 2022-12-30 DIAGNOSIS — G245 Blepharospasm: Secondary | ICD-10-CM

## 2022-12-30 DIAGNOSIS — Z0000 Encounter for general adult medical examination without abnormal findings: Secondary | ICD-10-CM

## 2022-12-30 LAB — COMPREHENSIVE METABOLIC PANEL
ALT: 15 U/L (ref 0–55)
AST (SGOT): 21 U/L (ref 5–41)
Albumin/Globulin Ratio: 1.4 (ref 0.9–2.2)
Albumin: 4 g/dL (ref 3.5–5.0)
Alkaline Phosphatase: 50 U/L (ref 37–117)
Anion Gap: 9 (ref 5.0–15.0)
BUN: 12 mg/dL (ref 7–21)
Bilirubin, Total: 0.3 mg/dL (ref 0.2–1.2)
CO2: 24 mEq/L (ref 17–29)
Calcium: 9.7 mg/dL (ref 8.5–10.5)
Chloride: 105 mEq/L (ref 99–111)
Creatinine: 0.8 mg/dL (ref 0.4–1.0)
GFR: >60 mL/min/{1.73_m2} (ref >=60.0)
Globulin: 2.9 g/dL (ref 2.0–3.6)
Glucose: 79 mg/dL (ref 70–100)
Hemolysis Index: 3 Index
Potassium: 4.5 mEq/L (ref 3.5–5.3)
Protein, Total: 6.9 g/dL (ref 6.0–8.3)
Sodium: 138 mEq/L (ref 135–145)

## 2022-12-30 LAB — LAB USE ONLY - URINALYSIS WITH REFLEX TO MICROSCOPIC EXAM AND CULTURE
Urine Bilirubin: NEGATIVE
Urine Blood: NEGATIVE
Urine Glucose: NEGATIVE
Urine Nitrite: NEGATIVE
Urine Protein: NEGATIVE
Urine Specific Gravity: 1.007 (ref 1.001–1.035)
Urine Urobilinogen: NORMAL mg/dL (ref 0.2–2.0)
Urine pH: 6 (ref 5.0–8.0)

## 2022-12-30 LAB — LAB USE ONLY - CBC WITH DIFFERENTIAL
Absolute Basophils: 0.04 10*3/uL (ref 0.00–0.08)
Absolute Eosinophils: 0.07 10*3/uL (ref 0.00–0.44)
Absolute Immature Granulocytes: 0.05 10*3/uL (ref 0.00–0.07)
Absolute Lymphocytes: 2.02 10*3/uL (ref 0.42–3.22)
Absolute Monocytes: 0.57 10*3/uL (ref 0.21–0.85)
Absolute Neutrophils: 6.96 10*3/uL — ABNORMAL HIGH (ref 1.10–6.33)
Absolute nRBC: 0 10*3/uL (ref <=0.00)
Basophils %: 0.4 %
Eosinophils %: 0.7 %
Hematocrit: 36.8 % (ref 34.7–43.7)
Hemoglobin: 12.2 g/dL (ref 11.4–14.8)
Immature Granulocytes %: 0.5 %
Lymphocytes %: 20.8 %
MCH: 30.2 pg (ref 25.1–33.5)
MCHC: 33.2 g/dL (ref 31.5–35.8)
MCV: 91.1 fL (ref 78.0–96.0)
MPV: 9.4 fL (ref 8.9–12.5)
Monocytes %: 5.9 %
Neutrophils %: 71.7 %
Platelet Count: 441 10*3/uL — ABNORMAL HIGH (ref 142–346)
Preliminary Absolute Neutrophil Count: 6.96 10*3/uL — ABNORMAL HIGH (ref 1.10–6.33)
RBC: 4.04 10*6/uL (ref 3.90–5.10)
RDW: 12 % (ref 11–15)
WBC: 9.71 10*3/uL — ABNORMAL HIGH (ref 3.10–9.50)
nRBC %: 0 /100 WBC (ref <=0.0)

## 2022-12-30 LAB — EPSTEIN-BARR VIRUS VCA ANTIBODY PANEL
EBV EBNA Ab, IgG: 68.7 U/mL — ABNORMAL HIGH (ref 0.0–17.9)
EBV VCA Ab, IgG: 96.1 U/mL — ABNORMAL HIGH (ref 0.0–17.9)
EBV VCA Ab, IgM: <10 U/mL (ref 0.0–35.9)

## 2022-12-30 LAB — LIPID PANEL
Cholesterol / HDL Ratio: 2.3 Index
Cholesterol: 137 mg/dL (ref <=199)
HDL: 60 mg/dL (ref >=40)
LDL Calculated: 70 mg/dL (ref 0–129)
Triglycerides: 35 mg/dL (ref 34–149)
VLDL Calculated: 7 mg/dL — ABNORMAL LOW (ref 10–40)

## 2022-12-30 LAB — TSH: TSH: 0.46 u[IU]/mL (ref 0.35–4.94)

## 2022-12-30 LAB — T4, FREE: T4 Free: 0.78 ng/dL (ref 0.69–1.48)

## 2022-12-30 LAB — MAGNESIUM: Magnesium: 1.9 mg/dL (ref 1.6–2.6)

## 2022-12-30 LAB — LAB USE ONLY - URINE GRAY CULTURE HOLD TUBE

## 2022-12-30 MED ORDER — SPIRONOLACTONE 50 MG PO TABS
50.0000 mg | ORAL_TABLET | Freq: Every day | ORAL | 3 refills | Status: AC
Start: 2022-12-30 — End: ?

## 2022-12-30 NOTE — Progress Notes (Signed)
Subjective:     Patient ID:  Deborah Kirk is a 47 y.o. female.       47 y.o. female here today for annual examination.  Mild lower abdominal discomfort and dysuria.  No hematuria.  Her son recently had mononucleosis.  She had similar symptoms but did not get tested.  Feels better now.  However feels some oral ulcers/canker sores.  Has a left eye twitch and wonders if it is related to mono.    Eye exam: up to date  Dental exam: up to date  Tetanus: up to date and due 2026    Colon cancer screening: up to date and due age 60  Breast cancer screening: up to date; will get order today to be done in Nov 2024  Cervical cancer screening: up to date 11/2019 - had neg pap and neg HPV        Current Medications[1]      The following portions of the patient's history were reviewed and updated as appropriate: allergies, current medications, past family history, past medical history, past social history, past surgical history and problem list.      Review of Systems   Constitutional:  Negative for chills, fatigue and fever.   HENT:  Negative for congestion, ear pain, rhinorrhea, sinus pressure and sore throat.    Eyes:  Negative for pain and itching.   Respiratory:  Negative for cough, shortness of breath and wheezing.    Cardiovascular:  Negative for chest pain and palpitations.   Gastrointestinal:  Negative for abdominal pain, constipation, diarrhea, nausea and vomiting.   Genitourinary:  Negative for dysuria, frequency and urgency.   Musculoskeletal:  Negative for arthralgias and back pain.   Neurological:  Negative for dizziness, light-headedness and headaches.   Psychiatric/Behavioral:  Negative for decreased concentration and dysphoric mood. The patient is not nervous/anxious.    All other systems reviewed and are negative.      BP 98/60 (BP Site: Right arm, Patient Position: Sitting)   Pulse 84   Temp 98.1 F (36.7 C) (Temporal)   Wt 53.5 kg (118 lb)   BMI 19.64 kg/m      Objective:   Physical Exam  Vitals  and nursing note reviewed.   Constitutional:       General: She is not in acute distress.     Appearance: She is well-developed.   HENT:      Head: Normocephalic and atraumatic.      Right Ear: Hearing, tympanic membrane, ear canal and external ear normal.      Left Ear: Hearing, tympanic membrane, ear canal and external ear normal.      Nose: Nose normal. No mucosal edema.      Mouth/Throat:      Pharynx: No oropharyngeal exudate.   Eyes:      General:         Right eye: No discharge.         Left eye: No discharge.      Conjunctiva/sclera: Conjunctivae normal.      Pupils: Pupils are equal, round, and reactive to light.   Cardiovascular:      Rate and Rhythm: Normal rate and regular rhythm.      Heart sounds: Normal heart sounds. No murmur heard.     No friction rub. No gallop.   Pulmonary:      Effort: Pulmonary effort is normal. No respiratory distress.      Breath sounds: Normal breath sounds. No wheezing or rales.  Abdominal:      General: Bowel sounds are normal. There is no distension.      Palpations: Abdomen is soft.      Tenderness: There is no abdominal tenderness. There is no rebound.   Musculoskeletal:         General: No tenderness. Normal range of motion.      Cervical back: Normal range of motion.   Lymphadenopathy:      Cervical: No cervical adenopathy.   Skin:     General: Skin is warm and dry.      Findings: No rash.   Neurological:      Mental Status: She is alert.      Cranial Nerves: No cranial nerve deficit.      Deep Tendon Reflexes: Reflexes are normal and symmetric.   Psychiatric:         Behavior: Behavior normal.          Assessment:      Diagnosis ICD-10-CM Associated Order   1. Routine general medical examination at a health care facility  Z00.00 CBC with Differential (Order)     Comprehensive Metabolic Panel     Lipid Panel     TSH     Urinalysis with Reflex to Microscopic Exam and Culture (Order)     Magnesium     T4, Free     Epstein-Barr Virus (EBV) VCA Antibody Panel      2.  Encounter for screening mammogram for malignant neoplasm of breast  Z12.31 Mammo Digital 2D Screening Bilateral W CAD      3. Infectious mononucleosis without complication, infectious mononucleosis due to unspecified organism  B27.90 Epstein-Barr Virus (EBV) VCA Antibody Panel      4. Eye twitch  G24.5 TSH     Magnesium     T4, Free            Plan:     1.  Well adult female.  Labs for screening purposes - she will return when insurance changes.  Immunizations for age up to date via records.    Immunization History   Administered Date(s) Administered    COVID-19 mRNA MONOVALENT vaccine PRIMARY SERIES 12 years and above Proofreader) 30 mcg/0.3 mL (DILUTE BEFORE USE) 05/20/2019, 06/07/2019, 08/14/2020    Influenza vaccine, quadrivalent, 3 years and older (FLUZONE), single-dose preservative free, 0.5 mL 02/02/2015, 03/27/2016    Influenza vaccine, quadrivalent, 6 months and older (AFLURIA/FLUARIX/FLULAVAL/FLUZONE), single-dose preservative free, 0.5 mL 05/24/2013, 03/03/2019    Influenza vaccine, quadrivalent, 6 months and older (Afluria/Fluzone), multi-dose, 5 mL 03/03/2014    Novel Influenza-H1N1-09 all formulations 03/28/2008    PPD Test 02/07/2015    Tdap (tetanus, diphtheria reduced, acellular pertussis), adsorbed vaccine 02/02/2015         2.  Mammogram ordered.    3.  Check EBV titers.    4.  Labs as above to assess.      Risk & benefits of any medication(s) given/prescribed were explained to the patient (and/or family) who verbalized understanding & agreed to the treatment plan.  Patient (and/or family) encouraged to contact me/clinical staff with any questions/concerns.      Procedures  :none       [1]   Current Outpatient Medications   Medication Sig Dispense Refill    buPROPion (WELLBUTRIN) 100 MG tablet Take 1 tablet (100 mg) by mouth daily      eszopiclone (LUNESTA) 1 MG tablet Take 1 tablet (1 mg total) by mouth nightly.Take immediately before bedtime 90 tablet  3    Mounjaro 5 MG/0.5ML injection INJECT 5 MG  SUBCUTANEOUSLY (UNDER THE SKIN) ONCE WEEKLY      Multiple Vitamin (MULTIVITAMIN) capsule Take 1 capsule by mouth daily      ondansetron (ZOFRAN-ODT) 8 MG disintegrating tablet       spironolactone (ALDACTONE) 50 MG tablet Take 1 tablet (50 mg) by mouth daily      Vortioxetine HBr (Trintellix) 20 MG Tab Take 1 tablet (20 mg) by mouth daily      Vyvanse 50 MG capsule Take 1 capsule (50 mg) by mouth every morning       No current facility-administered medications for this visit.

## 2023-01-01 ENCOUNTER — Encounter (INDEPENDENT_AMBULATORY_CARE_PROVIDER_SITE_OTHER): Payer: Self-pay | Admitting: Family Medicine

## 2023-01-01 DIAGNOSIS — B999 Unspecified infectious disease: Secondary | ICD-10-CM

## 2023-01-01 NOTE — Addendum Note (Signed)
Addended by: Hillery Aldo R on: 01/01/2023 04:12 PM     Modules accepted: Orders

## 2023-01-01 NOTE — Telephone Encounter (Signed)
Refer to result note. Informed Dr. Coralee Pesa Pt reached out

## 2023-01-01 NOTE — Progress Notes (Signed)
Hi,    I have reviewed your labs and they are reassuring.  The urine did not show urine infection.  The EBV test for mono shows chronic antibody (suggestive of infection >8 weeks ago).      Regards,    Antonieta Iba, MD  Family Medicine/Geriatrics    [facility] - [facility] - [facility]  [address], [place], [place] [address]  Demetrius Charity [phone]     F [phone]

## 2023-01-02 LAB — IMMUNOGLOBULINS IGG, IGA, IGM, QUANTITATIVE
Immunoglobulin A: 256 mg/dL (ref 65–421)
Immunoglobulin G: 1485 mg/dL (ref 540–1822)
Immunoglobulin M: 84 mg/dL (ref 22–293)

## 2023-01-02 NOTE — Telephone Encounter (Signed)
Lab was added on. Refer to result note.

## 2023-01-14 NOTE — Progress Notes (Signed)
Hi,    The immunoglobulin tests returned normal.      Regards,    Antonieta Iba, MD  Family Medicine/Geriatrics    [facility] - [facility] - [facility]  [address], [place], [place] [address]  Demetrius Charity [phone]     F [phone]

## 2023-03-25 ENCOUNTER — Other Ambulatory Visit (INDEPENDENT_AMBULATORY_CARE_PROVIDER_SITE_OTHER): Payer: Self-pay | Admitting: Family Medicine

## 2023-04-22 ENCOUNTER — Encounter (INDEPENDENT_AMBULATORY_CARE_PROVIDER_SITE_OTHER): Payer: Self-pay | Admitting: Family Medicine

## 2023-04-22 ENCOUNTER — Ambulatory Visit (INDEPENDENT_AMBULATORY_CARE_PROVIDER_SITE_OTHER): Payer: Commercial Managed Care - POS | Admitting: Family Medicine

## 2023-04-22 VITALS — BP 121/79 | HR 85 | Temp 97.9°F | Resp 17 | Ht 64.0 in | Wt 113.0 lb

## 2023-04-22 DIAGNOSIS — N898 Other specified noninflammatory disorders of vagina: Secondary | ICD-10-CM

## 2023-04-22 DIAGNOSIS — D473 Essential (hemorrhagic) thrombocythemia: Secondary | ICD-10-CM

## 2023-04-22 DIAGNOSIS — F418 Other specified anxiety disorders: Secondary | ICD-10-CM

## 2023-04-22 DIAGNOSIS — R61 Generalized hyperhidrosis: Secondary | ICD-10-CM

## 2023-04-22 DIAGNOSIS — R5383 Other fatigue: Secondary | ICD-10-CM

## 2023-04-22 NOTE — Progress Notes (Signed)
 Subjective:     Patient ID:  Deborah Kirk is a 47 y.o. female.       Here for acute visit.  She recently changed her insurance.    Unsure if she needs new referrals for her current specialists.  Also unsure if she needs prior authorizations for her

## 2023-04-22 NOTE — Progress Notes (Signed)
Have you seen any specialists since your last visit with [place]?  no      The patient was informed that the following HM items are still outstanding:   colonoscopy and pap smear

## 2023-04-23 LAB — LUTEINIZING HORMONE: LH: 12.6 m[IU]/mL

## 2023-04-23 LAB — CORTISOL: Cortisol: 16 ug/dL (ref 6.2–19.4)

## 2023-04-23 LAB — PROGESTERONE: Progesterone: 0.3 ng/mL

## 2023-04-23 LAB — FOLLICLE STIMULATING HORMONE: Follicle Stimulating Hormone: 9.5 m[IU]/mL

## 2023-04-23 LAB — ESTRADIOL: Estradiol: 314 pg/mL

## 2023-06-30 ENCOUNTER — Ambulatory Visit (INDEPENDENT_AMBULATORY_CARE_PROVIDER_SITE_OTHER): Payer: Commercial Managed Care - POS | Admitting: Obstetrics & Gynecology

## 2023-07-30 ENCOUNTER — Ambulatory Visit (INDEPENDENT_AMBULATORY_CARE_PROVIDER_SITE_OTHER): Payer: Commercial Managed Care - POS | Admitting: Obstetrics & Gynecology

## 2023-08-03 ENCOUNTER — Other Ambulatory Visit: Payer: Self-pay

## 2023-08-06 ENCOUNTER — Telehealth: Payer: Self-pay

## 2023-08-06 NOTE — Telephone Encounter (Signed)
 Returned the patient's call from 08/05/23 at 3:19 pm.  Reached out to the patient to review that she will need to have an updated CBC with differential done with her referring provider prior to scheduling as her most recent is from 12/2022.  The patient email her updated lab results.

## 2023-08-10 NOTE — Telephone Encounter (Signed)
 Reached out to the patient in regards to receiving her records.  The patient was not available to review scheduling her new patient appointment.  The patient was not available to review and I left a detailed message with my anem and direct call back number.     Dx: Thrombocytosis  Ref by: Dr. Pearson Caroline

## 2023-08-11 ENCOUNTER — Telehealth: Payer: Self-pay

## 2023-08-11 NOTE — Telephone Encounter (Signed)
 Returned the patient's call from 08/10/23 at 1:52 pm.      [facility] New Patient Coordinator Note    Physician/Location Preference:    Location Preference: [facility]    Physician Preference: Dr. Dyane    Referral:    Referring Provider:  Dr. Pearson Caroline    Is Referral required per insurance? Yes      Other:     Are there patient owned records that will be brought to the first appointment?No    Has the Appointment been scheduled? Yes The patient has been scheduled with Dr. Dyane on   01/20/24 at 11:30 am at [facility].     We reviewed the location of the clinic.    Dx: Thrombocytosis

## 2023-08-20 ENCOUNTER — Ambulatory Visit (INDEPENDENT_AMBULATORY_CARE_PROVIDER_SITE_OTHER): Payer: Commercial Managed Care - POS | Admitting: Obstetrics & Gynecology

## 2023-08-20 ENCOUNTER — Encounter (INDEPENDENT_AMBULATORY_CARE_PROVIDER_SITE_OTHER): Payer: Self-pay | Admitting: Obstetrics & Gynecology

## 2023-08-20 VITALS — BP 110/71 | HR 98 | Temp 97.9°F | Wt 117.2 lb

## 2023-08-20 DIAGNOSIS — Z803 Family history of malignant neoplasm of breast: Secondary | ICD-10-CM

## 2023-08-20 DIAGNOSIS — D473 Essential (hemorrhagic) thrombocythemia: Secondary | ICD-10-CM

## 2023-08-20 DIAGNOSIS — R1900 Intra-abdominal and pelvic swelling, mass and lump, unspecified site: Secondary | ICD-10-CM

## 2023-08-20 DIAGNOSIS — R92343 Mammographic extreme density, bilateral breasts: Secondary | ICD-10-CM

## 2023-08-20 DIAGNOSIS — Z800 Family history of malignant neoplasm of digestive organs: Secondary | ICD-10-CM

## 2023-08-20 DIAGNOSIS — Z01419 Encounter for gynecological examination (general) (routine) without abnormal findings: Secondary | ICD-10-CM

## 2023-08-20 MED ORDER — ESTRADIOL 0.1 MG/GM VA CREA
1.0000 g | TOPICAL_CREAM | VAGINAL | 3 refills | Status: DC
Start: 2023-08-20 — End: 2023-08-25

## 2023-08-20 MED ORDER — FLUCONAZOLE 150 MG PO TABS: 150.0000 mg | ORAL_TABLET | ORAL | 1 refills | Status: DC | PRN

## 2023-08-20 NOTE — Progress Notes (Signed)
 Well Woman Exam: [facility]    48 y.o. [phone] presenting for well woman exam.      Worried about ovarian cancer  Feels fullness, pressure, change in bowel movements, nausea  Having colonoscopy tomorrow    Never took birth control pills    Has paragard     Labs in November showed not menopause  Still having periods.     Review of Systems   Constitutional: Negative for fever.   HENT: Negative for hearing loss.    Eyes: Negative for blurred vision.   Respiratory: Negative for cough.    Cardiovascular: Negative for chest pain.   Gastrointestinal: Negative for heartburn.   Genitourinary: Negative for dysuria.   Musculoskeletal: Negative for myalgias.   Skin: Negative for rash.   Neurological: Negative for dizziness.   Endo/Heme/Allergies: Does not bruise/bleed easily.   Psychiatric/Behavioral: Negative for depression.       OB: [id number]  OB History   Gravida Para Term Preterm AB Living   3 2 2  0 1 2   SAB IAB Ectopic Multiple Live Births   1 0 0 0 2      # Outcome Date GA Lbr Len/2nd Weight Sex Type Anes PTL Lv   3 Term     M CSECT   LIV      Name: Elnita  2009   2 SAB            1 Term     M CSECT   LIV      Name: Ethan PETRIN         Past Medical History:   Diagnosis Date    Anxiety     Attention deficit hyperactivity disorder (ADHD)     Vaginal yeast infection     chronic       Past Surgical History[1]    Meds:    Current/Home Medications    BUPROPION  (WELLBUTRIN ) 100 MG TABLET    Take 1 tablet (100 mg) by mouth daily    ESZOPICLONE  (LUNESTA ) 1 MG TABLET    Take 1 tablet (1 mg total) by mouth nightly.Take immediately before bedtime    MOUNJARO 5 MG/0.5ML INJECTION    INJECT 5 MG SUBCUTANEOUSLY (UNDER THE SKIN) ONCE WEEKLY    MULTIPLE VITAMIN (MULTIVITAMIN) CAPSULE    Take 1 capsule by mouth daily    ONDANSETRON  (ZOFRAN -ODT) 8 MG DISINTEGRATING TABLET        SPIRONOLACTONE  (ALDACTONE ) 50 MG TABLET    Take 1 tablet (50 mg) by mouth daily    VORTIOXETINE HBR (TRINTELLIX) 20 MG TAB    Take 1 tablet (20 mg) by mouth  daily    VYVANSE 50 MG CAPSULE    Take 1 capsule (50 mg) by mouth every morning       Allergies:   Allergies[2]    Social History[3]      Family History[4]    BP 110/71 (BP Site: Right arm, Patient Position: Sitting)   Pulse 98   Temp 97.9 F (36.6 C)   Wt 117 lb 3.2 oz (53.2 kg)   BMI 20.12 kg/m   General: NAD, AOx3  Breast: Normal appearance bilaterally.  No skin changes or dimpling.  Nipples normal bilaterally, no nipple discharge. No palpable mass. No lymphadenopathy.  CV: RRR  Pulm: CTAB  Abd: Soft, NT/ND  Ext: No LE edema  SSE: Normal external female genitalia, urethral meatus normal, no urethral masses, no bladder masses, anus and perineum normal, normal vaginal and cervical mucosa with  no lesions or ulcerations, no blood within the vaginal vault. Iud strings seen.   SVE: Cervix long and closed, no cervical motion tenderness, uterine shape and size normal, uterus mobile, no adnexal masses, no adnexal tenderness to palpation, no urethral masses to palpation     A/P:  Cervical cancer screening reviewed and pap collected  STI screening reviewed and declines  Contraception reviewed and she has paragard  IUD  Reviewed breast health and self breast exam. She has extremely dense breasts on mammo report. MRI bilateral ordered.   Reviewed healthy weight and BMI.   RTO 1 year or sooner PRN.   TV sono to eval ovaries and uterus given symptom of pelvic fullness  Refer cancer genetics to discuss family history and see if any genetic testing recommended        Camie Kurk, MD  [facility]                 [1]   Past Surgical History:  Procedure Laterality Date    CESAREAN SECTION      WISDOM TOOTH EXTRACTION     [2]   Allergies  Allergen Reactions    Oxycodone-Acetaminophen  Other (See Comments)    Percocet [Oxycodone-Acetaminophen ] Hives    Sulfa Antibiotics Hives   [3]   Social History  Socioeconomic History    Marital status: Divorced   Tobacco Use    Smoking status: Never    Smokeless tobacco: Never    Substance and Sexual Activity    Alcohol use: No     Alcohol/week: 1.0 standard drink of alcohol     Types: 1 Glasses of wine per week    Drug use: No    Sexual activity: Yes     Partners: Male     Birth control/protection: Condom, I.U.D.     Comment: spouse vasectomy     Social Drivers of Psychologist, Prison And Probation Services Strain: Patient Declined (03/06/2022)    Overall Financial Resource Strain (CARDIA)     Difficulty of Paying Living Expenses: Patient declined   Transportation Needs: Patient Declined (03/06/2022)    PRAPARE - Transportation     Lack of Transportation (Medical): Patient declined     Lack of Transportation (Non-Medical): Patient declined   Physical Activity: Sufficiently Active (03/06/2022)    Exercise Vital Sign     Days of Exercise per Week: 3 days     Minutes of Exercise per Session: 60 min   Stress: Patient Declined (03/06/2022)    [employer] of Occupational Health - Occupational Stress Questionnaire     Feeling of Stress : Patient declined   Social Connections: Patient Declined (03/06/2022)    Social Connection and Isolation Panel [NHANES]     Frequency of Communication with Friends and Family: Patient declined     Frequency of Social Gatherings with Friends and Family: Patient declined     Attends Religious Services: Patient declined     Database Administrator or Organizations: Patient declined     Attends Banker Meetings: Patient declined     Marital Status: Patient declined   Intimate Partner Violence: Patient Declined (03/06/2022)    Humiliation, Afraid, Rape, and Kick questionnaire     Fear of Current or Ex-Partner: Patient declined     Emotionally Abused: Patient declined     Physically Abused: Patient declined     Sexually Abused: Patient declined   Housing Stability: Unknown (03/06/2022)    Housing Stability Vital Sign  Unable to Pay for Housing in the Last Year: Patient refused   [4]   Family History  Problem Relation Name Age of Onset    Sleep disorder Mother       Anxiety disorder Mother      Hepatitis Mother      Cancer Mother          Breast    Breast cancer Mother          dx in her 10    Depression Mother      Alzheimer's disease Father          79's    Thyroid disease Sister      Depression Sister      Cancer Maternal Grandfather          Colon    Cancer Paternal Grandfather          Colon    Breast cancer Sister

## 2023-08-21 ENCOUNTER — Telehealth (INDEPENDENT_AMBULATORY_CARE_PROVIDER_SITE_OTHER): Payer: Self-pay

## 2023-08-21 HISTORY — PX: COLONOSCOPY W/ POLYPECTOMY: SHX1380

## 2023-08-21 NOTE — Telephone Encounter (Signed)
 Deborah Kirk was referred to genetic counseling by Dr. Gasper Sells on 08/20/2023 for a family history of breast cancer     I left a voicemail with Deborah Kirk asking them to call us back with any questions, or if they would like to begin the scheduling process / schedule an appointment.          Deborah Kirk  [employer] Specialist   [phone]   [email or web address]   Fax: [phone]

## 2023-08-25 ENCOUNTER — Other Ambulatory Visit (INDEPENDENT_AMBULATORY_CARE_PROVIDER_SITE_OTHER): Payer: Self-pay | Admitting: Obstetrics & Gynecology

## 2023-08-25 MED ORDER — ESTRADIOL 10 MCG VA TABS
10.0000 ug | ORAL_TABLET | VAGINAL | 3 refills | Status: AC
Start: 2023-08-25 — End: 2024-08-24

## 2023-08-27 ENCOUNTER — Encounter (INDEPENDENT_AMBULATORY_CARE_PROVIDER_SITE_OTHER): Payer: Self-pay

## 2023-08-30 LAB — PAP IG, APTIMA HPV, RFX 16/18,45 TP
.: 0
HPV APTIMA: NEGATIVE

## 2023-09-08 ENCOUNTER — Ambulatory Visit (INDEPENDENT_AMBULATORY_CARE_PROVIDER_SITE_OTHER): Payer: Self-pay | Admitting: Family Medicine

## 2023-09-08 NOTE — Progress Notes (Signed)
 Hi,    Your mammogram returned normal.  Recommendation is to repeat in 1 year.      Regards,    Devoria Font, MD  Family Medicine/Geriatrics    [facility]  [address], [place], [place] [address]  Arlester Ladd [phone]     F [phone]

## 2023-10-30 ENCOUNTER — Telehealth: Payer: Self-pay | Admitting: Student in an Organized Health Care Education/Training Program

## 2023-10-30 ENCOUNTER — Telehealth: Payer: Self-pay

## 2023-10-30 DIAGNOSIS — D508 Other iron deficiency anemias: Secondary | ICD-10-CM

## 2023-10-30 DIAGNOSIS — D75839 Thrombocytosis, unspecified: Secondary | ICD-10-CM

## 2023-10-30 NOTE — Progress Notes (Signed)
 [facility]        DATE:  10/30/2023  NAME:  Deborah Kirk  MRN:  [id number]  AGE: 48 y.o.  DOB: 03/04/1976  SEX:  female    Consulting Provider:    Rosina [place], MD    [facility]  [address]  [place], [place] [address]  P [phone]  F [phone]    [facility]  [address]   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     Thrombocytosis can be caused by either reactive or autonomous process. Reactive processes account for most causes of thrombocytosis in all age groups and clinical settings. Common causes of reactive thrombocytosis include anemia, blood loss, iron deficiency, hemolysis, infection, malignancy, rheumatologic conditions, trauma, reactions to medications, or post-splenectomy or functional asplenia.  Mechanism of reactive thrombocytosis vary with the underlying cause and may include increased megakaryocyte proliferation/maturation, accelerated platelet release or reduced platelet sequestration/turnover. Blood loss/iorn deficiency is associated with increased proliferation of progenitor cells that are common to both red blood cells and platelets.     Primary thrombocytosis is caused by cell-intrinsic mechanisms. Most primary thrombocytosis in adults is clonal and caused by acquired mutations in the regulator pathway of thrombopoiesis (JAK2, CALR, MPL) and are associated with myeloproliferative neoplasms and other malignancies. Malignancies associated with thrombocytosis include essential thrombocythemia, polycythemia vera, Primary myelofibrosis, CML, MDS, and AML.     There can also be familial thrombocytosis which is a rare inherited syndrome associated with activating mutations in  the thrombopoietin gene (TPO) or thrombopoietin receptor gene (MPL).    Patients with particular symptoms are more likely to have a primary thrombocytosis including: erythromelalgia, flushing, itching, fevers, sweats, weight loss, or splenomegaly. Thrombosis at unusual sites (Budd-Chiari, portal vein, splenic vein) are also more likely to be associated with a primary thrombocytosis.     Although the level of thrombocytosis alone is not sufficient to define the cause, less extreme thrombocytosis is most commonly due to reactive causes.     Will send iron studies and evaluate for a myeloproliferative neoplasm.       Labs ordered today and can be drawn at any  facility or [facility]     Please contact hematology if new CBC abnormalities are noted on future routine surveillance     Thank you for this e-consultation.     10 minutes total time spent performing consultation. Recommendations provided by written communication.     The above treatment considerations and suggestions are based on review of the consulting provider's most recent note and any additional information provided. I have not personally examined the patient. All recommendations should be implemented with consideration of the patient's relevant prior history and current clinical status.      Please feel free to secure chat me Edwardo [place], MD) if you have any additional questions regarding management of this patient.  If additional assistance is needed after 14 days, an additional e-consult order may be requested.

## 2023-10-30 NOTE — Telephone Encounter (Signed)
 Reached out to the patient to review that Dr. Dyane will be leaving and her appointment on 01/20/24 at 11:30 am has been canceled. Reached out to review that Dr. Rosina [place] has ordered additional lab work to be done to determine if a Hematology consult is indicated. The reviewed that she will go the her PCP office to have the labs done.

## 2023-11-25 ENCOUNTER — Other Ambulatory Visit

## 2023-11-26 LAB — CBC AND DIFFERENTIAL
Baso(Absolute): 0 x10E3/uL (ref 0.0–0.2)
Basophils Automated: 1 %
Eosinophils Absolute: 0.2 x10E3/uL (ref 0.0–0.4)
Eosinophils Automated: 4 %
Hematocrit: 35.9 % (ref 34.0–46.6)
Hemoglobin: 11.8 g/dL (ref 11.1–15.9)
Immature Granulocytes Absolute: 0 x10E3/uL (ref 0.0–0.1)
Immature Granulocytes: 0 %
Lymphocytes Absolute: 1.4 x10E3/uL (ref 0.7–3.1)
Lymphocytes Automated: 28 %
MCH: 30.7 pg (ref 26.6–33.0)
MCHC: 32.9 g/dL (ref 31.5–35.7)
MCV: 94 fL (ref 79–97)
Monocytes Absolute: 0.3 x10E3/uL (ref 0.1–0.9)
Monocytes: 7 %
Neutrophils Absolute Count: 3 x10E3/uL (ref 1.4–7.0)
Neutrophils: 60 %
Platelets: 471 x10E3/uL — ABNORMAL HIGH (ref 150–450)
RBC: 3.84 x10E6/uL (ref 3.77–5.28)
RDW: 12.6 % (ref 11.7–15.4)
WBC: 4.9 x10E3/uL (ref 3.4–10.8)

## 2023-11-26 LAB — C-REACTIVE PROTEIN: C-Reactive Protein: <1 mg/L (ref 0–10)

## 2023-11-26 LAB — IRON PROFILE
Iron Saturation: 35 % (ref 15–55)
Iron: 112 ug/dL (ref 27–159)
TIBC: 320 ug/dL (ref 250–450)
UIBC: 208 ug/dL (ref 131–425)

## 2023-11-26 LAB — FERRITIN: Ferritin: 57 ng/mL (ref 15–150)

## 2023-11-26 LAB — SEDIMENTATION RATE: Sed Rate: 2 mm/h (ref 0–32)

## 2023-12-01 LAB — BCR-ABL1 GENE CML/ALL, QUANTITATIVE REAL-TIME PCR
Fusion Transcript (b2a2): <0.0032 %
Fusion Transcript (b3a2): <0.0032 %
Fusion Transcript (e1a2): <0.0032 %
Interpretation:: NEGATIVE

## 2023-12-08 LAB — JAK2 MUTATION ANALYSIS, QUANT RFX EXON 12-15 BY NGS

## 2023-12-08 LAB — JAK2V617F + CALR + MPL NGS PANEL

## 2023-12-08 LAB — REFLEX EXON 12-15

## 2023-12-10 ENCOUNTER — Telehealth (INDEPENDENT_AMBULATORY_CARE_PROVIDER_SITE_OTHER): Payer: Self-pay | Admitting: Student in an Organized Health Care Education/Training Program

## 2023-12-10 DIAGNOSIS — D75839 Thrombocytosis, unspecified: Secondary | ICD-10-CM

## 2023-12-10 NOTE — Progress Notes (Signed)
 [facility]-Consult        DATE:  12/10/2023  NAME:  Deborah Kirk  MRN:  [id number]  AGE: 48 y.o.  DOB: [birth year]  SEX:  female    Consulting Provider:    Rosina [place], MD    [facility]  [address]  [place], [place] [address]  P [phone]  F [phone]    [facility] [place]  [address]   [place], [place] [address]  [name] [phone]  F [phone]      Consult Question: thrombocytosis       Consultant Response:    Lab results reviewed.   JAK2 V617F and exons 12-15 negative  CALR negative  MPL negative   BCR:ABL pcr negat  Ferritin 57, iron 112, iron sat 35      These results are not consistent with a myeloproliferative neoplasm. Elevated platelets are likely reactive. 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     No further hematologic evaluation is recommended      Thank you for this e-consultation.     10 minutes total time spent performing consultation. Recommendations provided by written communication.     The above treatment considerations and suggestions are based on review of the consulting provider's most recent note and any additional information provided. I have not personally examined the patient. All recommendations should be implemented with consideration of the patient's relevant prior history and current clinical status.      Please feel free to secure chat me Edwardo [place], MD) if you have any additional questions regarding management of this patient.  If additional assistance is needed after 14 days, an additional e-consult order may be requested.  \

## 2024-01-20 ENCOUNTER — Ambulatory Visit: Admitting: Hematology

## 2024-03-29 ENCOUNTER — Other Ambulatory Visit (INDEPENDENT_AMBULATORY_CARE_PROVIDER_SITE_OTHER): Payer: Self-pay | Admitting: Family Medicine

## 2024-05-01 ENCOUNTER — Other Ambulatory Visit: Payer: Self-pay

## 2024-05-01 ENCOUNTER — Emergency Department (HOSPITAL_BASED_OUTPATIENT_CLINIC_OR_DEPARTMENT_OTHER): Payer: Self-pay

## 2024-05-01 ENCOUNTER — Emergency Department (HOSPITAL_BASED_OUTPATIENT_CLINIC_OR_DEPARTMENT_OTHER): Payer: Self-pay | Admitting: Radiology

## 2024-05-01 ENCOUNTER — Emergency Department (HOSPITAL_BASED_OUTPATIENT_CLINIC_OR_DEPARTMENT_OTHER)
Admission: EM | Admit: 2024-05-01 | Discharge: 2024-05-01 | Disposition: A | Discharge status: Home / Self Care | Payer: Self-pay | Attending: Emergency Medicine | Admitting: Emergency Medicine

## 2024-05-01 DIAGNOSIS — R7309 Other abnormal glucose: Secondary | ICD-10-CM | POA: Insufficient documentation

## 2024-05-01 DIAGNOSIS — L03031 Cellulitis of right toe: Secondary | ICD-10-CM | POA: Insufficient documentation

## 2024-05-01 LAB — CBG MONITORING, ED: Glucose-Capillary: 99 mg/dL (ref 70–99)

## 2024-05-01 MED ORDER — CIPROFLOXACIN HCL 500 MG PO TABS: 500.0000 mg | ORAL_TABLET | Freq: Two times a day (BID) | ORAL | 0 refills | Status: AC

## 2024-05-01 MED ORDER — CIPROFLOXACIN HCL 500 MG PO TABS
500.0000 mg | ORAL_TABLET | Freq: Once | ORAL | Status: AC
  Administered 2024-05-01: 500 mg via ORAL
  Filled 2024-05-01: qty 1

## 2024-05-01 NOTE — Discharge Instructions (Signed)
 You are seen today for an infection in your right big toe.  Your x-ray is reassuring.  We are going to put you on antibiotics.  Keep the area clean and dry.  Stop using peroxide, wash gently with soap and water  and keep it covered with a bandage, specially when at work.  Follow-up with the foot specialist.  Come back to the ER if you have fever, chills, severe pain, increasing redness or other color changes, or any other worsening symptoms.

## 2024-05-01 NOTE — ED Provider Notes (Signed)
 [facility] DEPARTMENT AT [facility] Provider Note   CSN: [id number] Arrival date & time: 05/01/24  1528     Patient presents with: Toe Injury   Crystal Hernandez is a 48 y.o. female.  Presents to ER for right great toe pain with possible infection.  States she had an ingrown toenail a couple weeks ago that she cut out herself.  States she has done this in the past without a problem, but over the past several days she has had increasing redness, has some drainage and has a bump on the skin and she is worried about infection.  She denies any known history of diabetes but does not routinely visit a physician.  She has been using Epsom salt soaks daily, but states she left her foot air dry, reports peroxide on the toe, and then applies Neosporin.  She keeps it covered with a Band-Aid while at work.   HPI     Prior to Admission medications   Medication Sig Start Date End Date Taking? Authorizing Provider  ciprofloxacin  (CIPRO ) 500 MG tablet Take 1 tablet (500 mg total) by mouth 2 (two) times daily. 05/01/24  Yes Dondrell Loudermilk A, PA-C  acetaminophen  (TYLENOL ) 500 MG tablet Take 1,000 mg by mouth every 6 (six) hours as needed for moderate pain or headache.     [provider]  acidophilus (RISAQUAD) CAPS capsule Take 1 capsule by mouth daily.    [provider]  doxycycline  (VIBRAMYCIN ) 100 MG capsule Take 1 capsule (100 mg total) by mouth 2 (two) times daily. 01/15/22   Bernis Ernst, PA-C  Multiple Vitamin (MULTIVITAMIN) tablet Take 1 tablet by mouth daily.    [provider]  vitamin C (ASCORBIC ACID) 500 MG tablet Take 500 mg by mouth daily.    [provider]    Allergies: Morphine and codeine and Lactose intolerance (gi)    Review of Systems  Updated Vital Signs BP (!) 172/109 (BP Location: Right Arm)   Pulse 93   Temp 98.3 F (36.8 C) (Oral)   Resp 20   SpO2 97%   Physical Exam Vitals and nursing note reviewed.   Constitutional:      General: She is not in acute distress.    Appearance: She is well-developed.  HENT:     Head: Normocephalic and atraumatic.  Eyes:     Extraocular Movements: Extraocular movements intact.     Conjunctiva/sclera: Conjunctivae normal.     Pupils: Pupils are equal, round, and reactive to light.  Cardiovascular:     Rate and Rhythm: Normal rate and regular rhythm.     Heart sounds: No murmur heard. Pulmonary:     Effort: Pulmonary effort is normal. No respiratory distress.     Breath sounds: Normal breath sounds.  Abdominal:     Palpations: Abdomen is soft.     Tenderness: There is no abdominal tenderness.  Musculoskeletal:        General: No swelling.     Cervical back: Neck supple.     Comments: DP and PT pulses intact in the right foot.  Capillary refill is brisk.  In the toes.  Mild swelling of the right great toe.  Skin:    General: Skin is warm and dry.     Capillary Refill: Capillary refill takes less than 2 seconds.     Comments: There is granulation tissue noted to the medial aspect of the right great toenail with some scant serous appearing drainage and erythema to  the dorsum of the distal phalanx of the right great toe.  No fluctuance or crepitus.  Neurological:     General: No focal deficit present.     Mental Status: She is alert and oriented to person, place, and time.  Psychiatric:        Mood and Affect: Mood normal.     (all labs ordered are listed, but only abnormal results are displayed) Labs Reviewed  CBG MONITORING, ED    EKG: None  Radiology: DG Foot Complete Right Result Date: 05/01/2024 CLINICAL DATA:  Fold ingrown toenail out of the great toe. Now with increased pain. Clinically appears infected. EXAM: RIGHT FOOT COMPLETE - 3+ VIEW COMPARISON:  None Available. FINDINGS: No fracture or bone lesion. There is no bone resorption to suggest osteomyelitis. Joints are normally spaced and aligned. Moderate-sized plantar calcaneal spur.  Mild soft tissue swelling noted along the medial distal great toe adjacent to the nail. IMPRESSION: 1. No fracture, joint abnormality or evidence of osteomyelitis. Electronically Signed   By: Alm Parkins M.D.   On: 05/01/2024 17:24     Procedures   Medications Ordered in the ED  ciprofloxacin  (CIPRO ) tablet 500 mg (500 mg Oral Given 05/01/24 1824)                                    Medical Decision Making Differential diagnosis includes but not limited to abscess, cellulitis, osteomyelitis, other  ED course: Patient presents to the ER for evaluation of right great toe infection.  This occurred after she cut an ingrown toenail out a couple weeks ago and several days ago started getting redness and having some drainage.  There is what appears to be a granuloma in this area and some serous drainage.  She has not been fevers or chills, she has no numbness or tingling.  X-ray was ordered and shows no fracture, no osteomyelitis and no soft tissue gas.  Will cover with oral antibiotics for cellulitis-use Cipro  to cover Pseudomonas.  Patient has not had labs recently so glucose ordered to evaluate for hyperglycemia and is 99.  Patient instructed to stop using peroxide, use soap and water  and follow-up with podiatry.  Amount and/or Complexity of Data Reviewed Radiology: ordered.  Risk Prescription drug management.        Final diagnoses:  Cellulitis of toe of right foot    ED Discharge Orders          Ordered    ciprofloxacin  (CIPRO ) 500 MG tablet  2 times daily        05/01/24 1826               Suellen Sherran DELENA DEVONNA 05/01/24 1831    Dasie Faden, MD 05/03/24 [id number]

## 2024-05-01 NOTE — ED Triage Notes (Signed)
 2 weeks ago ingrown toenail- self removed and took some skin with it. Now looks infected. Has been applying Neosporin.

## 2024-06-05 ENCOUNTER — Other Ambulatory Visit (INDEPENDENT_AMBULATORY_CARE_PROVIDER_SITE_OTHER): Payer: Self-pay | Admitting: Obstetrics & Gynecology

## 2024-09-15 ENCOUNTER — Ambulatory Visit (INDEPENDENT_AMBULATORY_CARE_PROVIDER_SITE_OTHER): Admitting: Specialist
